# Patient Record
Sex: Female | Born: 1935 | Race: White | Marital: Married | Smoking: Never smoker
Health system: Southern US, Community
[De-identification: ages and names within clinical notes are randomized; demographics above are authoritative.]

## PROBLEM LIST (undated history)

## (undated) DIAGNOSIS — I350 Nonrheumatic aortic (valve) stenosis: Secondary | ICD-10-CM

## (undated) DIAGNOSIS — F0390 Unspecified dementia without behavioral disturbance: Secondary | ICD-10-CM

## (undated) DIAGNOSIS — I4891 Unspecified atrial fibrillation: Secondary | ICD-10-CM

## (undated) DIAGNOSIS — I639 Cerebral infarction, unspecified: Secondary | ICD-10-CM

## (undated) HISTORY — DX: Nonrheumatic aortic (valve) stenosis: I35.0

## (undated) HISTORY — PX: CHOLECYSTECTOMY: SHX55

## (undated) HISTORY — DX: Unspecified atrial fibrillation: I48.91

## (undated) HISTORY — PX: ABDOMINAL HYSTERECTOMY: SHX81

---

## 2011-10-28 ENCOUNTER — Ambulatory Visit
Admission: RE | Admit: 2011-10-28 | Discharge: 2011-10-28 | Disposition: A | Discharge status: Home / Self Care | Payer: Medicare Other | Source: Ambulatory Visit | Attending: Family Medicine | Admitting: Family Medicine

## 2011-10-28 ENCOUNTER — Other Ambulatory Visit: Payer: Self-pay | Admitting: Family Medicine

## 2011-10-28 DIAGNOSIS — M549 Dorsalgia, unspecified: Secondary | ICD-10-CM

## 2012-04-22 ENCOUNTER — Encounter (HOSPITAL_COMMUNITY): Payer: Self-pay | Admitting: *Deleted

## 2012-04-22 ENCOUNTER — Emergency Department (HOSPITAL_COMMUNITY): Payer: Medicare Other

## 2012-04-22 ENCOUNTER — Inpatient Hospital Stay (HOSPITAL_COMMUNITY)
Admission: EM | Admit: 2012-04-22 | Discharge: 2012-04-27 | DRG: 312 | Disposition: A | Discharge status: Skilled Nursing Facility | Payer: Medicare Other | Source: Ambulatory Visit | Attending: Internal Medicine | Admitting: Internal Medicine

## 2012-04-22 DIAGNOSIS — I080 Rheumatic disorders of both mitral and aortic valves: Secondary | ICD-10-CM | POA: Diagnosis present

## 2012-04-22 DIAGNOSIS — I10 Essential (primary) hypertension: Secondary | ICD-10-CM

## 2012-04-22 DIAGNOSIS — E7800 Pure hypercholesterolemia, unspecified: Secondary | ICD-10-CM

## 2012-04-22 DIAGNOSIS — R55 Syncope and collapse: Secondary | ICD-10-CM

## 2012-04-22 DIAGNOSIS — R001 Bradycardia, unspecified: Secondary | ICD-10-CM

## 2012-04-22 DIAGNOSIS — Z7902 Long term (current) use of antithrombotics/antiplatelets: Secondary | ICD-10-CM

## 2012-04-22 DIAGNOSIS — Z66 Do not resuscitate: Secondary | ICD-10-CM | POA: Diagnosis present

## 2012-04-22 DIAGNOSIS — R011 Cardiac murmur, unspecified: Secondary | ICD-10-CM

## 2012-04-22 DIAGNOSIS — M545 Low back pain, unspecified: Secondary | ICD-10-CM | POA: Diagnosis present

## 2012-04-22 DIAGNOSIS — E1165 Type 2 diabetes mellitus with hyperglycemia: Secondary | ICD-10-CM

## 2012-04-22 DIAGNOSIS — Z8679 Personal history of other diseases of the circulatory system: Secondary | ICD-10-CM

## 2012-04-22 DIAGNOSIS — E860 Dehydration: Secondary | ICD-10-CM

## 2012-04-22 DIAGNOSIS — IMO0002 Reserved for concepts with insufficient information to code with codable children: Secondary | ICD-10-CM

## 2012-04-22 DIAGNOSIS — IMO0001 Reserved for inherently not codable concepts without codable children: Secondary | ICD-10-CM | POA: Diagnosis present

## 2012-04-22 DIAGNOSIS — E785 Hyperlipidemia, unspecified: Secondary | ICD-10-CM | POA: Diagnosis present

## 2012-04-22 DIAGNOSIS — D72829 Elevated white blood cell count, unspecified: Secondary | ICD-10-CM

## 2012-04-22 DIAGNOSIS — Z9861 Coronary angioplasty status: Secondary | ICD-10-CM

## 2012-04-22 DIAGNOSIS — S32000A Wedge compression fracture of unspecified lumbar vertebra, initial encounter for closed fracture: Secondary | ICD-10-CM

## 2012-04-22 DIAGNOSIS — I350 Nonrheumatic aortic (valve) stenosis: Secondary | ICD-10-CM | POA: Diagnosis present

## 2012-04-22 DIAGNOSIS — I2510 Atherosclerotic heart disease of native coronary artery without angina pectoris: Secondary | ICD-10-CM | POA: Diagnosis present

## 2012-04-22 DIAGNOSIS — I498 Other specified cardiac arrhythmias: Secondary | ICD-10-CM | POA: Diagnosis present

## 2012-04-22 DIAGNOSIS — F0390 Unspecified dementia without behavioral disturbance: Secondary | ICD-10-CM

## 2012-04-22 DIAGNOSIS — N390 Urinary tract infection, site not specified: Secondary | ICD-10-CM | POA: Diagnosis present

## 2012-04-22 DIAGNOSIS — Z8673 Personal history of transient ischemic attack (TIA), and cerebral infarction without residual deficits: Secondary | ICD-10-CM

## 2012-04-22 HISTORY — DX: Cerebral infarction, unspecified: I63.9

## 2012-04-22 HISTORY — DX: Unspecified dementia, unspecified severity, without behavioral disturbance, psychotic disturbance, mood disturbance, and anxiety: F03.90

## 2012-04-22 LAB — CBC WITH DIFFERENTIAL/PLATELET
Basophils Relative: 0 % (ref 0–1)
Eosinophils Absolute: 0 10*3/uL (ref 0.0–0.7)
Hemoglobin: 13.1 g/dL (ref 12.0–15.0)
MCH: 28.6 pg (ref 26.0–34.0)
MCHC: 33.9 g/dL (ref 30.0–36.0)
Monocytes Absolute: 0.5 10*3/uL (ref 0.1–1.0)
Monocytes Relative: 4 % (ref 3–12)
Neutrophils Relative %: 89 % — ABNORMAL HIGH (ref 43–77)

## 2012-04-22 LAB — MRSA PCR SCREENING: MRSA by PCR: NEGATIVE

## 2012-04-22 LAB — CARDIAC PANEL(CRET KIN+CKTOT+MB+TROPI)
CK, MB: 2.9 ng/mL (ref 0.3–4.0)
Troponin I: <0.3 ng/mL (ref <0.30)

## 2012-04-22 LAB — COMPREHENSIVE METABOLIC PANEL
ALT: 9 U/L (ref 0–35)
Albumin: 3.4 g/dL — ABNORMAL LOW (ref 3.5–5.2)
Alkaline Phosphatase: 64 U/L (ref 39–117)
BUN: 14 mg/dL (ref 6–23)
Potassium: 3.9 mEq/L (ref 3.5–5.1)
Sodium: 136 mEq/L (ref 135–145)
Total Protein: 6.2 g/dL (ref 6.0–8.3)

## 2012-04-22 LAB — GLUCOSE, CAPILLARY: Glucose-Capillary: 285 mg/dL — ABNORMAL HIGH (ref 70–99)

## 2012-04-22 LAB — URINALYSIS, ROUTINE W REFLEX MICROSCOPIC
Bilirubin Urine: NEGATIVE
Glucose, UA: >1000 mg/dL — AB
Hgb urine dipstick: NEGATIVE
Ketones, ur: 40 mg/dL — AB
Protein, ur: NEGATIVE mg/dL

## 2012-04-22 LAB — BASIC METABOLIC PANEL
BUN: 14 mg/dL (ref 6–23)
Creatinine, Ser: 0.52 mg/dL (ref 0.50–1.10)
GFR calc Af Amer: >90 mL/min (ref >90)
GFR calc non Af Amer: 83 mL/min — ABNORMAL LOW (ref >90)
Potassium: 3.9 mEq/L (ref 3.5–5.1)

## 2012-04-22 MED ORDER — AMLODIPINE BESYLATE 5 MG PO TABS
5.0000 mg | ORAL_TABLET | Freq: Every day | ORAL | Status: DC
  Administered 2012-04-23 – 2012-04-27 (×5): 5 mg via ORAL
  Filled 2012-04-22 (×5): qty 1

## 2012-04-22 MED ORDER — SODIUM CHLORIDE 0.9 % IV SOLN
INTRAVENOUS | Status: AC
  Administered 2012-04-22 – 2012-04-23 (×2): via INTRAVENOUS

## 2012-04-22 MED ORDER — LINAGLIPTIN 5 MG PO TABS
5.0000 mg | ORAL_TABLET | Freq: Every day | ORAL | Status: DC
  Administered 2012-04-23 – 2012-04-27 (×5): 5 mg via ORAL
  Filled 2012-04-22 (×5): qty 1

## 2012-04-22 MED ORDER — INSULIN ASPART 100 UNIT/ML ~~LOC~~ SOLN
0.0000 [IU] | Freq: Three times a day (TID) | SUBCUTANEOUS | Status: DC
  Administered 2012-04-23 (×2): 2 [IU] via SUBCUTANEOUS
  Administered 2012-04-23: 3 [IU] via SUBCUTANEOUS
  Administered 2012-04-24 (×2): 5 [IU] via SUBCUTANEOUS
  Administered 2012-04-24: 2 [IU] via SUBCUTANEOUS
  Administered 2012-04-25: 3 [IU] via SUBCUTANEOUS
  Administered 2012-04-25: 5 [IU] via SUBCUTANEOUS
  Administered 2012-04-25: 3 [IU] via SUBCUTANEOUS
  Administered 2012-04-26: 2 [IU] via SUBCUTANEOUS
  Administered 2012-04-26: 5 [IU] via SUBCUTANEOUS
  Administered 2012-04-26: 2 [IU] via SUBCUTANEOUS
  Administered 2012-04-27 (×2): 3 [IU] via SUBCUTANEOUS

## 2012-04-22 MED ORDER — ONDANSETRON HCL 4 MG/2ML IJ SOLN
4.0000 mg | Freq: Once | INTRAMUSCULAR | Status: AC
  Administered 2012-04-22: 4 mg via INTRAVENOUS

## 2012-04-22 MED ORDER — CLOPIDOGREL BISULFATE 75 MG PO TABS
75.0000 mg | ORAL_TABLET | Freq: Every day | ORAL | Status: DC
  Administered 2012-04-23 – 2012-04-27 (×5): 75 mg via ORAL
  Filled 2012-04-22 (×6): qty 1

## 2012-04-22 MED ORDER — SENNOSIDES-DOCUSATE SODIUM 8.6-50 MG PO TABS
1.0000 | ORAL_TABLET | Freq: Every evening | ORAL | Status: DC | PRN
  Filled 2012-04-22: qty 1

## 2012-04-22 MED ORDER — GLIPIZIDE 10 MG PO TABS
10.0000 mg | ORAL_TABLET | Freq: Two times a day (BID) | ORAL | Status: DC
  Administered 2012-04-23 – 2012-04-27 (×9): 10 mg via ORAL
  Filled 2012-04-22 (×11): qty 1

## 2012-04-22 MED ORDER — ONDANSETRON HCL 4 MG/2ML IJ SOLN
INTRAMUSCULAR | Status: AC
  Filled 2012-04-22: qty 2

## 2012-04-22 MED ORDER — ONDANSETRON HCL 4 MG/2ML IJ SOLN
INTRAMUSCULAR | Status: AC
  Administered 2012-04-22: 4 mg via INTRAVENOUS
  Filled 2012-04-22: qty 2

## 2012-04-22 MED ORDER — MORPHINE SULFATE 2 MG/ML IJ SOLN
2.0000 mg | Freq: Once | INTRAMUSCULAR | Status: AC
  Administered 2012-04-22: 2 mg via INTRAVENOUS
  Filled 2012-04-22: qty 1

## 2012-04-22 NOTE — ED Notes (Signed)
While getting pt back in the bed after orthostatic vitals she became sick. Heart rate dropped down to 25. Nurse notified. Pt now laying comfortably in bed.

## 2012-04-22 NOTE — ED Notes (Signed)
C-collar removed per MD Hyman Hopes.

## 2012-04-22 NOTE — ED Notes (Signed)
After standing patient up to obtain orthostatic BP, pt became nauseous, HR noted to drop to 25, pt assisted back into bed, started vomiting, HR returned to 80, MD Hyman Hopes notified and to bedside to assess patient.

## 2012-04-22 NOTE — Progress Notes (Signed)
ANTIBIOTIC CONSULT NOTE - INITIAL  Pharmacy Consult for Ciprofloxacin Indication: Possible UTI  Allergies  Allergen Reactions  . Penicillins     Patient Measurements: Height: 5\' 5"  (165.1 cm) Weight: 116 lb 2.9 oz (52.7 kg) IBW/kg (Calculated) : 57   Vital Signs: Temp: 98 F (36.7 C) (06/30 2200) Temp src: Oral (06/30 2200) BP: 141/55 mmHg (06/30 2200) Pulse Rate: 65  (06/30 2200) Intake/Output from previous day:   Intake/Output from this shift:    Labs:  [facility] 04/22/12 2100 04/22/12 1825  WBC -- 12.5*  HGB -- 13.1  PLT -- 284  LABCREA -- --  CREATININE 0.56 0.52   Estimated Creatinine Clearance: 41.2 ml/min (by C-G formula based on Cr of 0.56). No results found for this basename: VANCOTROUGH:2,VANCOPEAK:2,VANCORANDOM:2,GENTTROUGH:2,GENTPEAK:2,GENTRANDOM:2,TOBRATROUGH:2,TOBRAPEAK:2,TOBRARND:2,AMIKACINPEAK:2,AMIKACINTROU:2,AMIKACIN:2, in the last 72 hours   Microbiology: No results found for this or any previous visit (from the past 720 hour(s)).  Medical History: Past Medical History  Diagnosis Date  . Diabetes mellitus   . Dementia   . Stroke     Medications:  Prescriptions prior to admission  Medication Sig Dispense Refill  . amLODipine (NORVASC) 5 MG tablet Take 5 mg by mouth daily.      . clopidogrel (PLAVIX) 75 MG tablet Take 75 mg by mouth daily.      Marland Kitchen glipiZIDE (GLUCOTROL) 10 MG tablet Take 10 mg by mouth 2 (two) times daily before a meal.      . sitaGLIPtin (JANUVIA) 100 MG tablet Take 100 mg by mouth daily.       Assessment: 78 y.o. female presents with LOC. To begin Cipro for r/o UTI. Noted urine cx pending.  Goal of Therapy:  Eradication of infection  Plan:  1. Ciprofloxacin 400mg  IV q12h. 2. Will f/u renal function and microbiological data  Christoper Fabian, PharmD, BCPS Clinical pharmacist, pager [phone] 04/22/2012,10:54 PM

## 2012-04-22 NOTE — ED Notes (Signed)
Pt to CT scan.

## 2012-04-22 NOTE — ED Notes (Signed)
MD Toniann Fail notified of patient bradycardia episode, pt admission status changed to step down, MD at bedside to assess patient.

## 2012-04-22 NOTE — ED Provider Notes (Signed)
History     CSN: [id number]  Arrival date & time 04/22/12  1728   First MD Initiated Contact with Patient 04/22/12 1813      Chief Complaint  Patient presents with  . Fall    Patient is a 78 y.o. female presenting with syncope. The history is provided by the patient and the EMS personnel.  Loss of Consciousness This is a new problem. The current episode started today. Episode frequency: Patient fell once onto the left side of her head; unclear events surrounding as patient does not remember what happened, but she does remember being diaphoretic prior to the fall. The problem has been resolved. Associated symptoms include diaphoresis and weakness (at baseline). Pertinent negatives include no abdominal pain, chest pain, chills, coughing, fever, neck pain, numbness, rash or vomiting. The symptoms are aggravated by standing. She has tried nothing for the symptoms.    Past Medical History  Diagnosis Date  . Diabetes mellitus   . Dementia   . Stroke     History reviewed. No pertinent past surgical history.  History reviewed. No pertinent family history.  History  Substance Use Topics  . Smoking status: Not on file  . Smokeless tobacco: Not on file  . Alcohol Use:     OB History    Grav Para Term Preterm Abortions TAB SAB Ect Mult Living                  Review of Systems  Constitutional: Positive for diaphoresis. Negative for fever, chills, activity change and appetite change.  HENT: Negative for neck pain and neck stiffness.   Respiratory: Negative for cough, chest tightness, shortness of breath and wheezing.   Cardiovascular: Positive for syncope. Negative for chest pain and palpitations.  Gastrointestinal: Negative for vomiting, abdominal pain, diarrhea and constipation.  Genitourinary: Negative for dysuria, decreased urine volume and difficulty urinating.  Skin: Negative for rash and wound.  Neurological: Positive for syncope and weakness (at baseline). Negative for  seizures, facial asymmetry, light-headedness and numbness.  Psychiatric/Behavioral: Negative for confusion, decreased concentration and agitation.  All other systems reviewed and are negative.    Allergies  Review of patient's allergies indicates not on file.  Home Medications   Current Outpatient Rx  Name Route Sig Dispense Refill  . AMLODIPINE BESYLATE 5 MG PO TABS Oral Take 5 mg by mouth daily.    Marland Kitchen CLOPIDOGREL BISULFATE 75 MG PO TABS Oral Take 75 mg by mouth daily.    Marland Kitchen GLIPIZIDE 10 MG PO TABS Oral Take 10 mg by mouth 2 (two) times daily before a meal.    . SITAGLIPTIN PHOSPHATE 100 MG PO TABS Oral Take 100 mg by mouth daily.      BP 170/58  Pulse 68  Temp 97.6 F (36.4 C) (Oral)  Resp 18  SpO2 100%  Physical Exam  Nursing note and vitals reviewed. Constitutional: She appears well-developed and well-nourished.  HENT:  Head: Normocephalic and atraumatic.  Right Ear: External ear normal.  Left Ear: External ear normal.  Nose: Nose normal.  Mouth/Throat: Oropharynx is clear and moist. No oropharyngeal exudate.  Eyes: Conjunctivae are normal. Pupils are equal, round, and reactive to light.  Neck: Normal range of motion. Neck supple.  Cardiovascular: Normal rate, regular rhythm, normal heart sounds and intact distal pulses.  Exam reveals no gallop and no friction rub.   No murmur heard. Pulmonary/Chest: Effort normal and breath sounds normal. No respiratory distress. She has no wheezes. She has no rales. She  exhibits no tenderness.  Abdominal: Soft. Bowel sounds are normal. She exhibits distension (mildly). She exhibits no mass. There is no tenderness. There is no rebound and no guarding.  Musculoskeletal: Normal range of motion. She exhibits no edema and no tenderness.  Neurological: She is alert. She displays normal reflexes. No cranial nerve deficit. She exhibits abnormal muscle tone (decreased strength on hip flexion and shoulder extension bilaterally and equal).  Coordination normal.       Oriented x 2  Skin: Skin is warm and dry.  Psychiatric: She has a normal mood and affect. Her behavior is normal. Judgment and thought content normal.    ED Course  Procedures (including critical care time)  Labs Reviewed  GLUCOSE, CAPILLARY - Abnormal; Notable for the following:    Glucose-Capillary 285 (*)     All other components within normal limits  TROPONIN I  CBC WITH DIFFERENTIAL  BASIC METABOLIC PANEL  PROTIME-INR   Dg Lumbar Spine Complete  04/22/2012  *RADIOLOGY REPORT*  Clinical Data: Fall, low back pain.  LUMBAR SPINE - COMPLETE 4+ VIEW  Comparison: 10/28/2011  Findings: Moderate leftward scoliosis of the lumbar spine. Associated degenerative disc disease.  Diffuse degenerative facet disease.  Transitional anatomy at the lumbar sacral junction. Moderate compression fracture through L2, slightly progressed since prior study.  No new fracture.  Slight anterolisthesis of L4 on L5, related to facet disease.  SI joints are symmetric and unremarkable.  IMPRESSION: Levoscoliosis.  Advanced degenerative disc and facet disease.  Progressive L2 compression fracture.  No acute fracture.  Original Report Authenticated By: Cyndie Chime, M.D.   Ct Head Wo Contrast  04/22/2012  *RADIOLOGY REPORT*  Clinical Data:  Fall.  Injury.  CT HEAD WITHOUT CONTRAST CT CERVICAL SPINE WITHOUT CONTRAST  Technique:  Multidetector CT imaging of the head and cervical spine was performed following the standard protocol without intravenous contrast.  Multiplanar CT image reconstructions of the cervical spine were also generated.  Comparison:   None  CT HEAD  Findings: Remote left occipital and bilateral remote parietal infarcts noted.  There is a suggestion of small remote frontal vertex infarct and underlying chronic ischemic microvascular white matter disease.  Old infarcts involving the left basal ganglia noted.  The brainstem and cerebellum appear unremarkable.  Confluence of the  white matter hypodensity in the occipital parietal region noted.  No hydrocephalus, intracranial hemorrhage, or discrete mass lesion is observed.  Atherosclerotic calcification of the carotid siphons noted.  IMPRESSION:  1.  Multiple remote scattered infarcts, without acute intracranial findings. 2.  White matter confluence of hypodensity in the parietal lobes is likely due to white matter infarcts, and less likely to be due to other entities such as posterior reversible encephalopathy or other underlying lesion.  CT CERVICAL SPINE  Findings: There is chronic failure fusion of the posterior arch of C1.  Facet arthropathy is particularly bilaterally at C2-3, C3-4, and C4-5.  Facet arthropathy is also somewhat prominent on the right at T1-2, T2-3, and T3-4.  No cervical spine fracture or acute subluxation is observed.  Loss of intervertebral disc height and posterior osseous ridging noted at C4-5, C5-6, C6-7, and T1-2.  Uncinate and facet spurring cause osseous foraminal stenosis on the left at C4-5 and C5-6, and to a lesser extent at T1-2.  Osseous foraminal narrowing on the right side is present at C3-4, C4-5, C5- 6, C6-7, and T1-2.  There is a small amount of pannus posterior to the odontoid.  IMPRESSION:  1.  Cervical  spondylosis with multilevel osseous foraminal stenosis, but without fracture or acute subluxation observed. 2.  Failure fusion of the posterior arch of C1.  Original Report Authenticated By: Dellia Cloud, M.D.   Ct Cervical Spine Wo Contrast  04/22/2012  *RADIOLOGY REPORT*  Clinical Data:  Fall.  Injury.  CT HEAD WITHOUT CONTRAST CT CERVICAL SPINE WITHOUT CONTRAST  Technique:  Multidetector CT imaging of the head and cervical spine was performed following the standard protocol without intravenous contrast.  Multiplanar CT image reconstructions of the cervical spine were also generated.  Comparison:   None  CT HEAD  Findings: Remote left occipital and bilateral remote parietal infarcts  noted.  There is a suggestion of small remote frontal vertex infarct and underlying chronic ischemic microvascular white matter disease.  Old infarcts involving the left basal ganglia noted.  The brainstem and cerebellum appear unremarkable.  Confluence of the white matter hypodensity in the occipital parietal region noted.  No hydrocephalus, intracranial hemorrhage, or discrete mass lesion is observed.  Atherosclerotic calcification of the carotid siphons noted.  IMPRESSION:  1.  Multiple remote scattered infarcts, without acute intracranial findings. 2.  White matter confluence of hypodensity in the parietal lobes is likely due to white matter infarcts, and less likely to be due to other entities such as posterior reversible encephalopathy or other underlying lesion.  CT CERVICAL SPINE  Findings: There is chronic failure fusion of the posterior arch of C1.  Facet arthropathy is particularly bilaterally at C2-3, C3-4, and C4-5.  Facet arthropathy is also somewhat prominent on the right at T1-2, T2-3, and T3-4.  No cervical spine fracture or acute subluxation is observed.  Loss of intervertebral disc height and posterior osseous ridging noted at C4-5, C5-6, C6-7, and T1-2.  Uncinate and facet spurring cause osseous foraminal stenosis on the left at C4-5 and C5-6, and to a lesser extent at T1-2.  Osseous foraminal narrowing on the right side is present at C3-4, C4-5, C5- 6, C6-7, and T1-2.  There is a small amount of pannus posterior to the odontoid.  IMPRESSION:  1.  Cervical spondylosis with multilevel osseous foraminal stenosis, but without fracture or acute subluxation observed. 2.  Failure fusion of the posterior arch of C1.  Original Report Authenticated By: Dellia Cloud, M.D.   Dg Chest Portable 1 View  04/22/2012  *RADIOLOGY REPORT*  Clinical Data: Fall, back pain.  PORTABLE CHEST - 1 VIEW  Comparison: None  Findings: Mild biapical scarring.  Lungs otherwise clear.  Heart is normal size.  No  effusions.  No acute bony abnormality.  IMPRESSION: No active cardiopulmonary disease.  Original Report Authenticated By: Cyndie Chime, M.D.     1. Syncope      MDM  78 yo F presents after un-witnessed fall concerning for syncope. She states she felt diaphoretic prior to fall and does not believe she tripped over anything. After syncopal event patient became nauseated and vomited. No focal neuro deficits on exam. Patient does take Plavix for a history of a cardiac stent and has a history of diabetes mellitus, but patient not hypoglycemic. Imaging of head negative for evidence of skull fracture or intracranial bleed. Cervical spine imaging negative for evidence of cervical spine injury. Patient did have several more episodes of emesis in the Emergency Department after standing upright for orthostatics and became bradycardic to the 20's briefly during this episode. Before treatment could be administered, her rate returned to its baseline. No evidence of heart block or ischemia on EKG.  Hospitalist service consulted and will admit.         Clemetine Marker, MD 04/22/12 [phone]

## 2012-04-22 NOTE — ED Notes (Signed)
Pt returned to room from CT. MD Hyman Hopes at bedside, will obtain orthostatics and EKG once assessment is completed.

## 2012-04-22 NOTE — H&P (Signed)
Lorraine Tran is an 78 y.o. female.   PCP - Dr.Koirala of [facility]. Chief Complaint: Loss of consciousness. HPI: 78 year-old female with history of diabetes mellitus type 2, previous history of stroke on Plavix, hypertension had some loss of consciousness while she was at home at her yard. Her neighbors called the EMS and patient was brought to the ER. Does not know how long she lost consciousness for. She had incontinence of urine. In the ER she was complaining of low back pain. CT head and C-spine x-ray of LS-spine and chest x-ray does not show anything acute. While in the ER patient did receive morphine for back pain. We'll try to get orthostatics patient threw up once and during that episode patient had bradycardia and heart rate went down to 28 per minute with several episodes of 3 second pauses. Patient is only oriented to her name denies any chest pain palpitations shortness of breath or abdominal pain.  Past Medical History  Diagnosis Date  . Diabetes mellitus   . Dementia   . Stroke     Past Surgical History  Procedure Date  . Abdominal hysterectomy   . Cholecystectomy     History reviewed. No pertinent family history. Social History:  reports that she has never smoked. She does not have any smokeless tobacco history on file. She reports that she does not drink alcohol or use illicit drugs.  Allergies:  Allergies  Allergen Reactions  . Penicillins     Medications Prior to Admission  Medication Sig Dispense Refill  . amLODipine (NORVASC) 5 MG tablet Take 5 mg by mouth daily.      . clopidogrel (PLAVIX) 75 MG tablet Take 75 mg by mouth daily.      Marland Kitchen glipiZIDE (GLUCOTROL) 10 MG tablet Take 10 mg by mouth 2 (two) times daily before a meal.      . sitaGLIPtin (JANUVIA) 100 MG tablet Take 100 mg by mouth daily.        Results for orders placed during the hospital encounter of 04/22/12 (from the past 48 hour(s))  GLUCOSE, CAPILLARY     Status: Abnormal    Collection Time   04/22/12  5:50 PM      Component Value Range Comment   Glucose-Capillary 285 (*) 70 - 99 mg/dL   TROPONIN I     Status: Normal   Collection Time   04/22/12  6:24 PM      Component Value Range Comment   Troponin I <0.30  <0.30 ng/mL   CBC WITH DIFFERENTIAL     Status: Abnormal   Collection Time   04/22/12  6:25 PM      Component Value Range Comment   WBC 12.5 (*) 4.0 - 10.5 K/uL    RBC 4.58  3.87 - 5.11 MIL/uL    Hemoglobin 13.1  12.0 - 15.0 g/dL    HCT 16.1  09.6 - 04.5 %    MCV 84.3  78.0 - 100.0 fL    MCH 28.6  26.0 - 34.0 pg    MCHC 33.9  30.0 - 36.0 g/dL    RDW 40.9  81.1 - 91.4 %    Platelets 284  150 - 400 K/uL    Neutrophils Relative 89 (*) 43 - 77 %    Neutro Abs 11.1 (*) 1.7 - 7.7 K/uL    Lymphocytes Relative 7 (*) 12 - 46 %    Lymphs Abs 0.9  0.7 - 4.0 K/uL    Monocytes  Relative 4  3 - 12 %    Monocytes Absolute 0.5  0.1 - 1.0 K/uL    Eosinophils Relative 0  0 - 5 %    Eosinophils Absolute 0.0  0.0 - 0.7 K/uL    Basophils Relative 0  0 - 1 %    Basophils Absolute 0.0  0.0 - 0.1 K/uL   BASIC METABOLIC PANEL     Status: Abnormal   Collection Time   04/22/12  6:25 PM      Component Value Range Comment   Sodium 134 (*) 135 - 145 mEq/L    Potassium 3.9  3.5 - 5.1 mEq/L    Chloride 93 (*) 96 - 112 mEq/L    CO2 26  19 - 32 mEq/L    Glucose, Bld 297 (*) 70 - 99 mg/dL    BUN 14  6 - 23 mg/dL    Creatinine, Ser 4.09  0.50 - 1.10 mg/dL    Calcium 9.5  8.4 - 81.1 mg/dL    GFR calc non Af Amer 83 (*) >90 mL/min    GFR calc Af Amer >90  >90 mL/min   PROTIME-INR     Status: Normal   Collection Time   04/22/12  6:25 PM      Component Value Range Comment   Prothrombin Time 13.2  11.6 - 15.2 seconds    INR 0.98  0.00 - 1.49   URINALYSIS, ROUTINE W REFLEX MICROSCOPIC     Status: Abnormal   Collection Time   04/22/12  7:40 PM      Component Value Range Comment   Color, Urine YELLOW  YELLOW    APPearance CLOUDY (*) CLEAR    Specific Gravity, Urine 1.022   1.005 - 1.030    pH 5.5  5.0 - 8.0    Glucose, UA >1000 (*) NEGATIVE mg/dL    Hgb urine dipstick NEGATIVE  NEGATIVE    Bilirubin Urine NEGATIVE  NEGATIVE    Ketones, ur 40 (*) NEGATIVE mg/dL    Protein, ur NEGATIVE  NEGATIVE mg/dL    Urobilinogen, UA 0.2  0.0 - 1.0 mg/dL    Nitrite NEGATIVE  NEGATIVE    Leukocytes, UA TRACE (*) NEGATIVE   URINE MICROSCOPIC-ADD ON     Status: Normal   Collection Time   04/22/12  7:40 PM      Component Value Range Comment   Squamous Epithelial / LPF RARE  RARE    WBC, UA 0-2  <3 WBC/hpf    Dg Lumbar Spine Complete  04/22/2012  *RADIOLOGY REPORT*  Clinical Data: Fall, low back pain.  LUMBAR SPINE - COMPLETE 4+ VIEW  Comparison: 10/28/2011  Findings: Moderate leftward scoliosis of the lumbar spine. Associated degenerative disc disease.  Diffuse degenerative facet disease.  Transitional anatomy at the lumbar sacral junction. Moderate compression fracture through L2, slightly progressed since prior study.  No new fracture.  Slight anterolisthesis of L4 on L5, related to facet disease.  SI joints are symmetric and unremarkable.  IMPRESSION: Levoscoliosis.  Advanced degenerative disc and facet disease.  Progressive L2 compression fracture.  No acute fracture.  Original Report Authenticated By: Cyndie Chime, M.D.   Ct Head Wo Contrast  04/22/2012  *RADIOLOGY REPORT*  Clinical Data:  Fall.  Injury.  CT HEAD WITHOUT CONTRAST CT CERVICAL SPINE WITHOUT CONTRAST  Technique:  Multidetector CT imaging of the head and cervical spine was performed following the standard protocol without intravenous contrast.  Multiplanar CT image reconstructions of the cervical  spine were also generated.  Comparison:   None  CT HEAD  Findings: Remote left occipital and bilateral remote parietal infarcts noted.  There is a suggestion of small remote frontal vertex infarct and underlying chronic ischemic microvascular white matter disease.  Old infarcts involving the left basal ganglia noted.  The  brainstem and cerebellum appear unremarkable.  Confluence of the white matter hypodensity in the occipital parietal region noted.  No hydrocephalus, intracranial hemorrhage, or discrete mass lesion is observed.  Atherosclerotic calcification of the carotid siphons noted.  IMPRESSION:  1.  Multiple remote scattered infarcts, without acute intracranial findings. 2.  White matter confluence of hypodensity in the parietal lobes is likely due to white matter infarcts, and less likely to be due to other entities such as posterior reversible encephalopathy or other underlying lesion.  CT CERVICAL SPINE  Findings: There is chronic failure fusion of the posterior arch of C1.  Facet arthropathy is particularly bilaterally at C2-3, C3-4, and C4-5.  Facet arthropathy is also somewhat prominent on the right at T1-2, T2-3, and T3-4.  No cervical spine fracture or acute subluxation is observed.  Loss of intervertebral disc height and posterior osseous ridging noted at C4-5, C5-6, C6-7, and T1-2.  Uncinate and facet spurring cause osseous foraminal stenosis on the left at C4-5 and C5-6, and to a lesser extent at T1-2.  Osseous foraminal narrowing on the right side is present at C3-4, C4-5, C5- 6, C6-7, and T1-2.  There is a small amount of pannus posterior to the odontoid.  IMPRESSION:  1.  Cervical spondylosis with multilevel osseous foraminal stenosis, but without fracture or acute subluxation observed. 2.  Failure fusion of the posterior arch of C1.  Original Report Authenticated By: Dellia Cloud, M.D.   Ct Cervical Spine Wo Contrast  04/22/2012  *RADIOLOGY REPORT*  Clinical Data:  Fall.  Injury.  CT HEAD WITHOUT CONTRAST CT CERVICAL SPINE WITHOUT CONTRAST  Technique:  Multidetector CT imaging of the head and cervical spine was performed following the standard protocol without intravenous contrast.  Multiplanar CT image reconstructions of the cervical spine were also generated.  Comparison:   None  CT HEAD  Findings:  Remote left occipital and bilateral remote parietal infarcts noted.  There is a suggestion of small remote frontal vertex infarct and underlying chronic ischemic microvascular white matter disease.  Old infarcts involving the left basal ganglia noted.  The brainstem and cerebellum appear unremarkable.  Confluence of the white matter hypodensity in the occipital parietal region noted.  No hydrocephalus, intracranial hemorrhage, or discrete mass lesion is observed.  Atherosclerotic calcification of the carotid siphons noted.  IMPRESSION:  1.  Multiple remote scattered infarcts, without acute intracranial findings. 2.  White matter confluence of hypodensity in the parietal lobes is likely due to white matter infarcts, and less likely to be due to other entities such as posterior reversible encephalopathy or other underlying lesion.  CT CERVICAL SPINE  Findings: There is chronic failure fusion of the posterior arch of C1.  Facet arthropathy is particularly bilaterally at C2-3, C3-4, and C4-5.  Facet arthropathy is also somewhat prominent on the right at T1-2, T2-3, and T3-4.  No cervical spine fracture or acute subluxation is observed.  Loss of intervertebral disc height and posterior osseous ridging noted at C4-5, C5-6, C6-7, and T1-2.  Uncinate and facet spurring cause osseous foraminal stenosis on the left at C4-5 and C5-6, and to a lesser extent at T1-2.  Osseous foraminal narrowing on the right side  is present at C3-4, C4-5, C5- 6, C6-7, and T1-2.  There is a small amount of pannus posterior to the odontoid.  IMPRESSION:  1.  Cervical spondylosis with multilevel osseous foraminal stenosis, but without fracture or acute subluxation observed. 2.  Failure fusion of the posterior arch of C1.  Original Report Authenticated By: Dellia Cloud, M.D.   Dg Chest Portable 1 View  04/22/2012  *RADIOLOGY REPORT*  Clinical Data: Fall, back pain.  PORTABLE CHEST - 1 VIEW  Comparison: None  Findings: Mild biapical  scarring.  Lungs otherwise clear.  Heart is normal size.  No effusions.  No acute bony abnormality.  IMPRESSION: No active cardiopulmonary disease.  Original Report Authenticated By: Cyndie Chime, M.D.    Review of Systems  Constitutional: Negative.   HENT: Negative.   Eyes: Negative.   Cardiovascular: Negative.   Gastrointestinal: Positive for nausea and vomiting.  Genitourinary: Negative.   Musculoskeletal: Positive for back pain.  Skin: Negative.   Neurological: Positive for loss of consciousness.  Endo/Heme/Allergies: Negative.   Psychiatric/Behavioral: Negative.     Blood pressure 147/51, pulse 63, temperature 97.6 F (36.4 C), temperature source Oral, resp. rate 17, SpO2 100.00%. Physical Exam  Constitutional: She appears well-developed and well-nourished.  HENT:  Head: Normocephalic and atraumatic.  Right Ear: External ear normal.  Left Ear: External ear normal.  Nose: Nose normal.  Mouth/Throat: Oropharynx is clear and moist. No oropharyngeal exudate.  Eyes: Conjunctivae are normal. Pupils are equal, round, and reactive to light. Right eye exhibits no discharge. Left eye exhibits no discharge. No scleral icterus.  Neck: Normal range of motion. Neck supple.  Cardiovascular: Normal rate and regular rhythm.   Murmur heard. Respiratory: Effort normal and breath sounds normal. No respiratory distress. She has no wheezes. She has no rales.  GI: Soft. Bowel sounds are normal. She exhibits no distension. There is no tenderness. There is no rebound.  Neurological: She is alert.       Oriented to name only. Moves all extremities but moving lower extremities causes pain.  Skin: Skin is warm and dry.     Assessment/Plan #1. Syncope - given the bradycardic episodes and heart murmur a syncopal episode is most likely from cardiac cause. We'll observe in telemetry. I did discuss with on-call cardiologist Dr. Aldona Bar who will be seeing patient in consult. Given history of stroke  we'll get an MRI brain and until then patient will be on neurochecks. Gently hydrate. #2. Low back pain - x-ray L. spine shows progression of L2 spine fracture. I did discuss with radiologist who says there is no acute fracture is in the x-ray. Patient does have significant pain on moving her hips for which I ordered x-ray pelvis and also will get MRI of the LS-spine. #3. Uncontrolled diabetes - I don't think patient is in DKA. Follow metabolic panel closely. Gently hydrate and patient will be continued on her Glucotrol and Januvia sliding-scale coverage. #4. Leukocytosis - probably reactionary. Could also be from UTI. #5. Possible UTI - check urine cultures. I have placed patient on ciprofloxacin. #6. History of hypertension - continue present medication. #7. History of stroke on Plavix.  CODE STATUS - full code.  Eduard Clos 04/22/2012, 10:13 PM

## 2012-04-22 NOTE — ED Provider Notes (Addendum)
BP 111/72  Pulse 84  Temp 97.6 F (36.4 C) (Oral)  Resp 18  SpO2 94%  I saw and evaluated the patient, reviewed the resident's note and I agree with the findings and plan, ekg interpretation (reviewed by me)  LOC unk cause. Vomited after awakening. C/O headache, neck pain, lower back pain. Neuro unremarkable. EKG unremarkable. Plans for imaging and admission for syncope w/u. O2 sat 97-100% on arrival, 94% after morphine 2mg . Will reassess.   Forbes Cellar, MD 04/22/12 2020  Forbes Cellar, MD 04/22/12 2022  Pt did have a near syncopal episode in ED while tech staff performing orthostatics. HR 20s at that time, indeterminate rhythm. WNL after lying down. Vomiting. Zofran, hosp aware.  Forbes Cellar, MD 04/24/12 [phone]

## 2012-04-22 NOTE — ED Notes (Signed)
Pt in via EMS- per EMS, they were called by a person who drove by the patients house and noted patient to be laying on porch, pt was responsive upon arrival, unable to determine if patient fell or if she just sat down and laid down. Pt admits to intermittent vomiting over last two days. No trauma noted to patient head or face, dry dirt noted to patient face from laying down. Pt CBG was 200 per EMS. Vital signs within normal limits, EKG within normal limits. Pt c/o pain to mid-back pain to palpation, pt placed on LSB and c-collar by EMS. Pt alert and oriented per her normal, pt with history of dementia per patient daughter. Pt states she remember sitting outside and then she remembers the paramedics, pt unsure if she fell.

## 2012-04-23 ENCOUNTER — Inpatient Hospital Stay (HOSPITAL_COMMUNITY): Payer: Medicare Other

## 2012-04-23 DIAGNOSIS — R001 Bradycardia, unspecified: Secondary | ICD-10-CM | POA: Diagnosis present

## 2012-04-23 DIAGNOSIS — S32000A Wedge compression fracture of unspecified lumbar vertebra, initial encounter for closed fracture: Secondary | ICD-10-CM | POA: Diagnosis present

## 2012-04-23 DIAGNOSIS — I359 Nonrheumatic aortic valve disorder, unspecified: Secondary | ICD-10-CM

## 2012-04-23 DIAGNOSIS — E7800 Pure hypercholesterolemia, unspecified: Secondary | ICD-10-CM | POA: Diagnosis present

## 2012-04-23 DIAGNOSIS — R55 Syncope and collapse: Principal | ICD-10-CM

## 2012-04-23 DIAGNOSIS — I350 Nonrheumatic aortic (valve) stenosis: Secondary | ICD-10-CM | POA: Diagnosis present

## 2012-04-23 DIAGNOSIS — D72829 Elevated white blood cell count, unspecified: Secondary | ICD-10-CM | POA: Diagnosis present

## 2012-04-23 DIAGNOSIS — I498 Other specified cardiac arrhythmias: Secondary | ICD-10-CM

## 2012-04-23 DIAGNOSIS — F0390 Unspecified dementia without behavioral disturbance: Secondary | ICD-10-CM

## 2012-04-23 DIAGNOSIS — E860 Dehydration: Secondary | ICD-10-CM | POA: Diagnosis present

## 2012-04-23 LAB — LIPID PANEL
Cholesterol: 228 mg/dL — ABNORMAL HIGH (ref 0–200)
HDL: 51 mg/dL (ref >39)
Total CHOL/HDL Ratio: 4.5 RATIO
Triglycerides: 120 mg/dL (ref <150)

## 2012-04-23 LAB — CARDIAC PANEL(CRET KIN+CKTOT+MB+TROPI)
CK, MB: 3.4 ng/mL (ref 0.3–4.0)
Relative Index: INVALID (ref 0.0–2.5)
Relative Index: 3 — ABNORMAL HIGH (ref 0.0–2.5)
Total CK: 60 U/L (ref 7–177)
Troponin I: <0.3 ng/mL (ref <0.30)

## 2012-04-23 LAB — COMPREHENSIVE METABOLIC PANEL
ALT: 7 U/L (ref 0–35)
AST: 11 U/L (ref 0–37)
Alkaline Phosphatase: 59 U/L (ref 39–117)
CO2: 25 mEq/L (ref 19–32)
GFR calc Af Amer: >90 mL/min (ref >90)
GFR calc non Af Amer: 83 mL/min — ABNORMAL LOW (ref >90)
Glucose, Bld: 234 mg/dL — ABNORMAL HIGH (ref 70–99)
Potassium: 3.8 mEq/L (ref 3.5–5.1)
Sodium: 138 mEq/L (ref 135–145)

## 2012-04-23 LAB — CBC
HCT: 33.5 % — ABNORMAL LOW (ref 36.0–46.0)
Hemoglobin: 11.4 g/dL — ABNORMAL LOW (ref 12.0–15.0)
MCH: 28.6 pg (ref 26.0–34.0)
MCHC: 34 g/dL (ref 30.0–36.0)
Platelets: 270 10*3/uL (ref 150–400)

## 2012-04-23 LAB — GLUCOSE, CAPILLARY: Glucose-Capillary: 170 mg/dL — ABNORMAL HIGH (ref 70–99)

## 2012-04-23 LAB — HEMOGLOBIN A1C
Hgb A1c MFr Bld: 8.6 % — ABNORMAL HIGH (ref <5.7)
Mean Plasma Glucose: 200 mg/dL — ABNORMAL HIGH (ref <117)

## 2012-04-23 MED ORDER — ATORVASTATIN CALCIUM 10 MG PO TABS
10.0000 mg | ORAL_TABLET | Freq: Every day | ORAL | Status: DC
  Administered 2012-04-23 – 2012-04-26 (×4): 10 mg via ORAL
  Filled 2012-04-23 (×5): qty 1

## 2012-04-23 MED ORDER — WHITE PETROLATUM GEL
Status: AC
  Filled 2012-04-23: qty 5

## 2012-04-23 MED ORDER — PNEUMOCOCCAL VAC POLYVALENT 25 MCG/0.5ML IJ INJ
0.5000 mL | INJECTION | INTRAMUSCULAR | Status: AC
  Administered 2012-04-24: 0.5 mL via INTRAMUSCULAR
  Filled 2012-04-23: qty 0.5

## 2012-04-23 NOTE — Progress Notes (Signed)
CRITICAL VALUE ALERT  Critical value received:  CKMB-6.3  Date of notification:  04-23-12  Time of notification:  1639  Critical value read back:yes  Nurse who received alert:  Armida Sans RN  MD notified (1st page):  Sharon Seller, MD  Time of first page:  1640  MD notified (2nd page):  Time of second page:  Responding MD:  Sharon Seller  Time MD responded:  [id number]

## 2012-04-23 NOTE — Progress Notes (Signed)
Utilization Review Completed.  Lorraine Tran T  04/23/2012  

## 2012-04-23 NOTE — Progress Notes (Signed)
Subjective: A little woozy Objective: Filed Vitals:   04/23/12 1000 04/23/12 1100 04/23/12 1232 04/23/12 1417  BP: 124/54 144/46 125/70   Pulse: 74 75 87 81  Temp:  98 F (36.7 C)    TempSrc:  Oral    Resp: 20 22 17    Height:      Weight:      SpO2: 95% 91% 95%    Weight change:   Intake/Output Summary (Last 24 hours) at 04/23/12 1524 Last data filed at 04/23/12 1200  Gross per 24 hour  Intake   1320 ml  Output    675 ml  Net    645 ml    General: Alert, awake, oriented x3, in no acute distress Neck:  JVP is normal Heart: Regular rate and rhythm  Gr III/VI systolic Lungs: Clear to auscultation.  No rales or wheezes. Exemities:  No edema.   Neuro: Grossly intact, nonfocal.  TELE:  SR.  No signif pauses  Lab Results: Results for orders placed during the hospital encounter of 04/22/12 (from the past 24 hour(s))  GLUCOSE, CAPILLARY     Status: Abnormal   Collection Time   04/22/12  5:50 PM      Component Value Range   Glucose-Capillary 285 (*) 70 - 99 mg/dL  TROPONIN I     Status: Normal   Collection Time   04/22/12  6:24 PM      Component Value Range   Troponin I <0.30  <0.30 ng/mL  CBC WITH DIFFERENTIAL     Status: Abnormal   Collection Time   04/22/12  6:25 PM      Component Value Range   WBC 12.5 (*) 4.0 - 10.5 K/uL   RBC 4.58  3.87 - 5.11 MIL/uL   Hemoglobin 13.1  12.0 - 15.0 g/dL   HCT 16.1  09.6 - 04.5 %   MCV 84.3  78.0 - 100.0 fL   MCH 28.6  26.0 - 34.0 pg   MCHC 33.9  30.0 - 36.0 g/dL   RDW 40.9  81.1 - 91.4 %   Platelets 284  150 - 400 K/uL   Neutrophils Relative 89 (*) 43 - 77 %   Neutro Abs 11.1 (*) 1.7 - 7.7 K/uL   Lymphocytes Relative 7 (*) 12 - 46 %   Lymphs Abs 0.9  0.7 - 4.0 K/uL   Monocytes Relative 4  3 - 12 %   Monocytes Absolute 0.5  0.1 - 1.0 K/uL   Eosinophils Relative 0  0 - 5 %   Eosinophils Absolute 0.0  0.0 - 0.7 K/uL   Basophils Relative 0  0 - 1 %   Basophils Absolute 0.0  0.0 - 0.1 K/uL  BASIC METABOLIC PANEL     Status:  Abnormal   Collection Time   04/22/12  6:25 PM      Component Value Range   Sodium 134 (*) 135 - 145 mEq/L   Potassium 3.9  3.5 - 5.1 mEq/L   Chloride 93 (*) 96 - 112 mEq/L   CO2 26  19 - 32 mEq/L   Glucose, Bld 297 (*) 70 - 99 mg/dL   BUN 14  6 - 23 mg/dL   Creatinine, Ser 7.82  0.50 - 1.10 mg/dL   Calcium 9.5  8.4 - 95.6 mg/dL   GFR calc non Af Amer 83 (*) >90 mL/min   GFR calc Af Amer >90  >90 mL/min  PROTIME-INR     Status: Normal   Collection Time  04/22/12  6:25 PM      Component Value Range   Prothrombin Time 13.2  11.6 - 15.2 seconds   INR 0.98  0.00 - 1.49  URINALYSIS, ROUTINE W REFLEX MICROSCOPIC     Status: Abnormal   Collection Time   04/22/12  7:40 PM      Component Value Range   Color, Urine YELLOW  YELLOW   APPearance CLOUDY (*) CLEAR   Specific Gravity, Urine 1.022  1.005 - 1.030   pH 5.5  5.0 - 8.0   Glucose, UA >1000 (*) NEGATIVE mg/dL   Hgb urine dipstick NEGATIVE  NEGATIVE   Bilirubin Urine NEGATIVE  NEGATIVE   Ketones, ur 40 (*) NEGATIVE mg/dL   Protein, ur NEGATIVE  NEGATIVE mg/dL   Urobilinogen, UA 0.2  0.0 - 1.0 mg/dL   Nitrite NEGATIVE  NEGATIVE   Leukocytes, UA TRACE (*) NEGATIVE  URINE MICROSCOPIC-ADD ON     Status: Normal   Collection Time   04/22/12  7:40 PM      Component Value Range   Squamous Epithelial / LPF RARE  RARE   WBC, UA 0-2  <3 WBC/hpf  CARDIAC PANEL(CRET KIN+CKTOT+MB+TROPI)     Status: Normal   Collection Time   04/22/12  9:00 PM      Component Value Range   Total CK 41  7 - 177 U/L   CK, MB 2.9  0.3 - 4.0 ng/mL   Troponin I <0.30  <0.30 ng/mL   Relative Index RELATIVE INDEX IS INVALID  0.0 - 2.5  COMPREHENSIVE METABOLIC PANEL     Status: Abnormal   Collection Time   04/22/12  9:00 PM      Component Value Range   Sodium 136  135 - 145 mEq/L   Potassium 3.9  3.5 - 5.1 mEq/L   Chloride 97  96 - 112 mEq/L   CO2 26  19 - 32 mEq/L   Glucose, Bld 313 (*) 70 - 99 mg/dL   BUN 14  6 - 23 mg/dL   Creatinine, Ser 1.61  0.50 - 1.10  mg/dL   Calcium 9.0  8.4 - 09.6 mg/dL   Total Protein 6.2  6.0 - 8.3 g/dL   Albumin 3.4 (*) 3.5 - 5.2 g/dL   AST 13  0 - 37 U/L   ALT 9  0 - 35 U/L   Alkaline Phosphatase 64  39 - 117 U/L   Total Bilirubin 0.4  0.3 - 1.2 mg/dL   GFR calc non Af Amer 81 (*) >90 mL/min   GFR calc Af Amer >90  >90 mL/min  MRSA PCR SCREENING     Status: Normal   Collection Time   04/22/12  9:45 PM      Component Value Range   MRSA by PCR NEGATIVE  NEGATIVE  HEMOGLOBIN A1C     Status: Abnormal   Collection Time   04/22/12 11:35 PM      Component Value Range   Hemoglobin A1C 8.6 (*) <5.7 %   Mean Plasma Glucose 200 (*) <117 mg/dL  CARDIAC PANEL(CRET KIN+CKTOT+MB+TROPI)     Status: Normal   Collection Time   04/22/12 11:36 PM      Component Value Range   Total CK 60  7 - 177 U/L   CK, MB 3.4  0.3 - 4.0 ng/mL   Troponin I <0.30  <0.30 ng/mL   Relative Index RELATIVE INDEX IS INVALID  0.0 - 2.5  CARDIAC PANEL(CRET KIN+CKTOT+MB+TROPI)  Status: Normal   Collection Time   04/23/12  3:50 AM      Component Value Range   Total CK 74  7 - 177 U/L   CK, MB 3.7  0.3 - 4.0 ng/mL   Troponin I <0.30  <0.30 ng/mL   Relative Index RELATIVE INDEX IS INVALID  0.0 - 2.5  COMPREHENSIVE METABOLIC PANEL     Status: Abnormal   Collection Time   04/23/12  3:50 AM      Component Value Range   Sodium 138  135 - 145 mEq/L   Potassium 3.8  3.5 - 5.1 mEq/L   Chloride 101  96 - 112 mEq/L   CO2 25  19 - 32 mEq/L   Glucose, Bld 234 (*) 70 - 99 mg/dL   BUN 14  6 - 23 mg/dL   Creatinine, Ser 2.13  0.50 - 1.10 mg/dL   Calcium 8.7  8.4 - 08.6 mg/dL   Total Protein 5.7 (*) 6.0 - 8.3 g/dL   Albumin 3.1 (*) 3.5 - 5.2 g/dL   AST 11  0 - 37 U/L   ALT 7  0 - 35 U/L   Alkaline Phosphatase 59  39 - 117 U/L   Total Bilirubin 0.5  0.3 - 1.2 mg/dL   GFR calc non Af Amer 83 (*) >90 mL/min   GFR calc Af Amer >90  >90 mL/min  CBC     Status: Abnormal   Collection Time   04/23/12  3:50 AM      Component Value Range   WBC 10.1  4.0 -  10.5 K/uL   RBC 3.98  3.87 - 5.11 MIL/uL   Hemoglobin 11.4 (*) 12.0 - 15.0 g/dL   HCT 57.8 (*) 46.9 - 62.9 %   MCV 84.2  78.0 - 100.0 fL   MCH 28.6  26.0 - 34.0 pg   MCHC 34.0  30.0 - 36.0 g/dL   RDW 52.8  41.3 - 24.4 %   Platelets 270  150 - 400 K/uL  LIPID PANEL     Status: Abnormal   Collection Time   04/23/12  3:50 AM      Component Value Range   Cholesterol 228 (*) 0 - 200 mg/dL   Triglycerides 010  <272 mg/dL   HDL 51  >53 mg/dL   Total CHOL/HDL Ratio 4.5     VLDL 24  0 - 40 mg/dL   LDL Cholesterol 664 (*) 0 - 99 mg/dL  GLUCOSE, CAPILLARY     Status: Abnormal   Collection Time   04/23/12 11:37 AM      Component Value Range   Glucose-Capillary 189 (*) 70 - 99 mg/dL   Comment 1 Notify RN      Studies/Results: Dg Lumbar Spine Complete  04/22/2012  *RADIOLOGY REPORT*  Clinical Data: Fall, low back pain.  LUMBAR SPINE - COMPLETE 4+ VIEW  Comparison: 10/28/2011  Findings: Moderate leftward scoliosis of the lumbar spine. Associated degenerative disc disease.  Diffuse degenerative facet disease.  Transitional anatomy at the lumbar sacral junction. Moderate compression fracture through L2, slightly progressed since prior study.  No new fracture.  Slight anterolisthesis of L4 on L5, related to facet disease.  SI joints are symmetric and unremarkable.  IMPRESSION: Levoscoliosis.  Advanced degenerative disc and facet disease.  Progressive L2 compression fracture.  No acute fracture.  Original Report Authenticated By: Cyndie Chime, M.D.   Ct Head Wo Contrast  04/22/2012  *RADIOLOGY REPORT*  Clinical Data:  Fall.  Injury.  CT HEAD WITHOUT CONTRAST CT CERVICAL SPINE WITHOUT CONTRAST  Technique:  Multidetector CT imaging of the head and cervical spine was performed following the standard protocol without intravenous contrast.  Multiplanar CT image reconstructions of the cervical spine were also generated.  Comparison:   None  CT HEAD  Findings: Remote left occipital and bilateral remote parietal  infarcts noted.  There is a suggestion of small remote frontal vertex infarct and underlying chronic ischemic microvascular white matter disease.  Old infarcts involving the left basal ganglia noted.  The brainstem and cerebellum appear unremarkable.  Confluence of the white matter hypodensity in the occipital parietal region noted.  No hydrocephalus, intracranial hemorrhage, or discrete mass lesion is observed.  Atherosclerotic calcification of the carotid siphons noted.  IMPRESSION:  1.  Multiple remote scattered infarcts, without acute intracranial findings. 2.  White matter confluence of hypodensity in the parietal lobes is likely due to white matter infarcts, and less likely to be due to other entities such as posterior reversible encephalopathy or other underlying lesion.  CT CERVICAL SPINE  Findings: There is chronic failure fusion of the posterior arch of C1.  Facet arthropathy is particularly bilaterally at C2-3, C3-4, and C4-5.  Facet arthropathy is also somewhat prominent on the right at T1-2, T2-3, and T3-4.  No cervical spine fracture or acute subluxation is observed.  Loss of intervertebral disc height and posterior osseous ridging noted at C4-5, C5-6, C6-7, and T1-2.  Uncinate and facet spurring cause osseous foraminal stenosis on the left at C4-5 and C5-6, and to a lesser extent at T1-2.  Osseous foraminal narrowing on the right side is present at C3-4, C4-5, C5- 6, C6-7, and T1-2.  There is a small amount of pannus posterior to the odontoid.  IMPRESSION:  1.  Cervical spondylosis with multilevel osseous foraminal stenosis, but without fracture or acute subluxation observed. 2.  Failure fusion of the posterior arch of C1.  Original Report Authenticated By: Dellia Cloud, M.D.   Ct Cervical Spine Wo Contrast  04/22/2012  *RADIOLOGY REPORT*  Clinical Data:  Fall.  Injury.  CT HEAD WITHOUT CONTRAST CT CERVICAL SPINE WITHOUT CONTRAST  Technique:  Multidetector CT imaging of the head and  cervical spine was performed following the standard protocol without intravenous contrast.  Multiplanar CT image reconstructions of the cervical spine were also generated.  Comparison:   None  CT HEAD  Findings: Remote left occipital and bilateral remote parietal infarcts noted.  There is a suggestion of small remote frontal vertex infarct and underlying chronic ischemic microvascular white matter disease.  Old infarcts involving the left basal ganglia noted.  The brainstem and cerebellum appear unremarkable.  Confluence of the white matter hypodensity in the occipital parietal region noted.  No hydrocephalus, intracranial hemorrhage, or discrete mass lesion is observed.  Atherosclerotic calcification of the carotid siphons noted.  IMPRESSION:  1.  Multiple remote scattered infarcts, without acute intracranial findings. 2.  White matter confluence of hypodensity in the parietal lobes is likely due to white matter infarcts, and less likely to be due to other entities such as posterior reversible encephalopathy or other underlying lesion.  CT CERVICAL SPINE  Findings: There is chronic failure fusion of the posterior arch of C1.  Facet arthropathy is particularly bilaterally at C2-3, C3-4, and C4-5.  Facet arthropathy is also somewhat prominent on the right at T1-2, T2-3, and T3-4.  No cervical spine fracture or acute subluxation is observed.  Loss of intervertebral disc height and posterior  osseous ridging noted at C4-5, C5-6, C6-7, and T1-2.  Uncinate and facet spurring cause osseous foraminal stenosis on the left at C4-5 and C5-6, and to a lesser extent at T1-2.  Osseous foraminal narrowing on the right side is present at C3-4, C4-5, C5- 6, C6-7, and T1-2.  There is a small amount of pannus posterior to the odontoid.  IMPRESSION:  1.  Cervical spondylosis with multilevel osseous foraminal stenosis, but without fracture or acute subluxation observed. 2.  Failure fusion of the posterior arch of C1.  Original Report  Authenticated By: Dellia Cloud, M.D.   Mr Brain Wo Contrast  04/23/2012  *RADIOLOGY REPORT*  Clinical Data: 78year old hypertensive diabetic with episode of loss of consciousness.  MRI HEAD WITHOUT CONTRAST  Technique:  Multiplanar, multiecho pulse sequences of the brain and surrounding structures were obtained according to standard protocol without intravenous contrast.  Comparison: 04/22/2012 CT.  No comparison MR.  Findings: No acute infarct.  Remote infarcts with encephalomalacia involving the right parietal - occipital lobe, left occipital lobe and medial left temporal lobe.  Smaller remote infarcts involving the basal ganglia and corona radiata.  Moderate small vessel disease type changes.  Global atrophy without hydrocephalus.  No intracranial hemorrhage.  No intracranial mass lesion detected on this unenhanced exam.  Cervical spondylotic changes with spinal stenosis C4-5 and C5-6.  Major intracranial vascular structures are patent.  IMPRESSION: No acute infarct.  Remote infarcts, small vessel disease type changes and atrophy as noted above.  Original Report Authenticated By: Fuller Canada, M.D.   Mr Lumbar Spine Wo Contrast  04/23/2012  *RADIOLOGY REPORT*  Clinical Data: Loss of consciousness.  Back pain.  MRI LUMBAR SPINE WITHOUT CONTRAST  Technique:  Multiplanar and multiecho pulse sequences of the lumbar spine were obtained without intravenous contrast.  Comparison: 04/22/2012 and 10/28/2011 plain film examination of the lumbar spine.  No comparison MR.  Findings: Transitional vertebra has been labeled S1 on prior plain film examination.  Utilizing this level assignment, on the present examination incorporates from the T10-11 disc space through the lower sacrum.  Conus L1-2 level.  Fracture of the sacrum incompletely assessed on the present exam.  Anterior wedge compression fracture of the L2 vertebral body involving superior endplate with 60% loss of height anteriorly. Retropulsion of the  posterior-superior aspect of the compressed vertebra combined with broad-based disc protrusion and facet joint degenerative changes is causing L1-2 mild to slightly moderate spinal stenosis and mild bilateral foraminal narrowing. Compression fracture of L2 is noted on the 10/28/2011 plain film examination but has progressed (edema is seen throughout the vertebral body as well small amount of edema within the adjacent L1 inferior endplate).  Widening of the facet joints.  T10-11:  Negative.  T11-12:  Minimal Schmorl's node deformity.  T12-L1:  Very small Schmorl's node deformity.  L1-2:  As above.  L2-3:  Facet joint degenerative changes.  Ligamentum flavum hypertrophy.  Mild bulge.  Very mild spinal stenosis and foraminal narrowing.  L3-4:  Facet joint degenerative changes and ligamentum flavum hypertrophy greater on the right.  Prominent disc degeneration with broad-based disc osteophyte right lateral position approaching but not compressing the exiting right L3 nerve root.  Mild spinal stenosis greater on the right.  L4-5:  Prominent bilateral facet joint degenerative changes. Ligamentum flavum hypertrophy.  Minimal anterior slip L4. Bulge/osteophyte greater to the right with mild impression upon the exiting right L4 nerve root.  Multifactorial severe spinal stenosis.  L5-S1:  Prominent facet joint degenerative  changes and ligamentum flavum hypertrophy greater on the left.  Moderate bulge/broad-based protrusion osteophyte greater to the left and centrally.  Mild encroaching upon the exiting left L5 nerve root.  Marked left-sided and moderate right-sided lateral recess stenosis.  Moderate spinal stenosis greater on the left.  S1-S2:  Pars defects.  Bony overgrowth.  Minimal anterior slip S1. Bulge/small protrusion with slight cephalad extension.  Lateral osteophyte greater to the right encroaching upon the exiting nerve root.  IMPRESSION:  Transitional vertebra has been labeled S1 on prior plain film examination.   Fracture of the sacrum incompletely assessed on the present exam.  Anterior wedge compression fracture of the L2 vertebral body involving superior endplate with 60% loss of height anteriorly. Retropulsion of the posterior-superior aspect of the compressed vertebra combined with broad-based disc protrusion and facet joint degenerative changes is causing L1-2 mild to slightly moderate spinal stenosis and mild bilateral foraminal narrowing. Compression fracture of L2 is noted on the 10/28/2011 plain film examination but has progressed.  L3-4 broad-based disc osteophyte right lateral position approaching but not compressing the exiting right L3 nerve root.  Mild spinal stenosis greater on the right.  L4-5 bulge/osteophyte greater to the right with mild impression upon the exiting right L4 nerve root.  Multifactorial severe spinal stenosis.  L5-S1 moderate bulge/broad-based protrusion osteophyte greater to the left and centrally.  Mild encroaching upon the exiting left L5 nerve root.  Marked left-sided and moderate right-sided lateral recess stenosis.  Moderate spinal stenosis greater on the left.  S1-S2 prior defects.  Bony overgrowth.  Minimal anterior slip S1. Bulge/small protrusion with slight cephalad extension.  Lateral osteophyte greater to the right encroaching upon the exiting nerve root.  Please see above further detail.  This has been made a PRA call report utilizing dashboard call feature.  Original Report Authenticated By: Fuller Canada, M.D.   Dg Chest Portable 1 View  04/22/2012  *RADIOLOGY REPORT*  Clinical Data: Fall, back pain.  PORTABLE CHEST - 1 VIEW  Comparison: None  Findings: Mild biapical scarring.  Lungs otherwise clear.  Heart is normal size.  No effusions.  No acute bony abnormality.  IMPRESSION: No active cardiopulmonary disease.  Original Report Authenticated By: Cyndie Chime, M.D.    Medications: Reviewed  Tele:  No signif pauses.   Patient Active Hospital Problem  List:  Syncope (04/22/2012)   Assessment: No further spells  Patient says she feels a little woozy.  Does get woozy at home but keeps working. Echo shows normal LV systolic function.  Moderate AS with mean gradient of 26 mm Hg.  Mild MS.  I do not think this explains syncopal spell I have reviewed strips with Odessa Fleming regarding possible pacer  He plans to see patient  Murmur Echo with moderate AS, mild MS>  Hyperlipidemia.  Evidence of atherosclerosis on scans.  Should be on a statin.  Rx [email or web address] Dietrich Pates 04/23/2012, 3:24 PM

## 2012-04-23 NOTE — Consult Note (Signed)
Referring Physician: Toniann Fail Primary Cardiologist: None Reason for Consultation: Syncope, Heart murmur, and >3 second pause (sinus arrest)  HPI: 34 YOWF with PMH of CVA, DM, and Dementia who presents as a consult for evaluation of syncope.  At baseline, the patient is functional and lives at home with her husband.  She is able to do her ADLs without assistance.  Today, she was in her usual state of health when she had a syncopal episode.  She remembers going to walk outside and then remembers waking up when EMS had arrived.  She denies CP or palpitations before this.  She has had no episodes of syncope.  She denies angina or HF; however, she is somewhat of an unreliable historian due to her dementia. She is not on any AV nodal blockers and there are no CVA symptoms.   In the ED, she was noted to have 3.5 second pause with sinus arrest.  Also, she was noted to have a loud murmur.  Therefore, cardiology was consulted.    Past Medical History  Diagnosis Date  . Diabetes mellitus   . Dementia   . Stroke     History   Social History  . Marital Status: Married    Spouse Name: N/A    Number of Children: N/A  . Years of Education: N/A   Occupational History  . Not on file.   Social History Main Topics  . Smoking status: Never Smoker   . Smokeless tobacco: Not on file  . Alcohol Use: No  . Drug Use: No  . Sexually Active:    Other Topics Concern  . Not on file   Social History Narrative  . No narrative on file    FH: No history of pacemakers.   Medications Prior to Admission  Medication Sig Dispense Refill  . amLODipine (NORVASC) 5 MG tablet Take 5 mg by mouth daily.      . clopidogrel (PLAVIX) 75 MG tablet Take 75 mg by mouth daily.      Marland Kitchen glipiZIDE (GLUCOTROL) 10 MG tablet Take 10 mg by mouth 2 (two) times daily before a meal.      . sitaGLIPtin (JANUVIA) 100 MG tablet Take 100 mg by mouth daily.        Allergies  Allergen Reactions  . Penicillins     Review of  Systems: All systems reviewed and are negative except as mentioned above in the history of present illness.    PHYSICAL EXAM: Filed Vitals:   04/22/12 2200  BP: 141/55  Pulse: 65  Temp: 98 F (36.7 C)  Resp: 13   GENERAL: No acute distress.   HEENT: Normocephalic, atraumatic.  Oropharynx is pink and moist without lesions.  NECK: Supple, no LAD, no JVD, no masses. CV: Regular rate and rhythm with harsh, late peaking 3/6 SEM across the precordium, muffled A2.    LUNGS: Clear to auscultation bilaterally.   ABDOMEN: +BS, soft, nontender, nondistended.  EXTREMITIES: No clubbing, cyanosis, or edema.   NEURO: AO x 3, no focal deficits. PYSCH: Normal affect. SKIN: No rashes.     ECG: NSR at 63 BPM, normal intervals, normal axis  TELE: sinus mechanism with episode of sinus arrest with 2 narrow complexes escape complexes with an escape interval of 3.5 seconds.   Results for orders placed during the hospital encounter of 04/22/12 (from the past 24 hour(s))  GLUCOSE, CAPILLARY     Status: Abnormal   Collection Time   04/22/12  5:50 PM  Component Value Range   Glucose-Capillary 285 (*) 70 - 99 mg/dL  TROPONIN I     Status: Normal   Collection Time   04/22/12  6:24 PM      Component Value Range   Troponin I <0.30  <0.30 ng/mL  CBC WITH DIFFERENTIAL     Status: Abnormal   Collection Time   04/22/12  6:25 PM      Component Value Range   WBC 12.5 (*) 4.0 - 10.5 K/uL   RBC 4.58  3.87 - 5.11 MIL/uL   Hemoglobin 13.1  12.0 - 15.0 g/dL   HCT 09.8  11.9 - 14.7 %   MCV 84.3  78.0 - 100.0 fL   MCH 28.6  26.0 - 34.0 pg   MCHC 33.9  30.0 - 36.0 g/dL   RDW 82.9  56.2 - 13.0 %   Platelets 284  150 - 400 K/uL   Neutrophils Relative 89 (*) 43 - 77 %   Neutro Abs 11.1 (*) 1.7 - 7.7 K/uL   Lymphocytes Relative 7 (*) 12 - 46 %   Lymphs Abs 0.9  0.7 - 4.0 K/uL   Monocytes Relative 4  3 - 12 %   Monocytes Absolute 0.5  0.1 - 1.0 K/uL   Eosinophils Relative 0  0 - 5 %   Eosinophils Absolute  0.0  0.0 - 0.7 K/uL   Basophils Relative 0  0 - 1 %   Basophils Absolute 0.0  0.0 - 0.1 K/uL  BASIC METABOLIC PANEL     Status: Abnormal   Collection Time   04/22/12  6:25 PM      Component Value Range   Sodium 134 (*) 135 - 145 mEq/L   Potassium 3.9  3.5 - 5.1 mEq/L   Chloride 93 (*) 96 - 112 mEq/L   CO2 26  19 - 32 mEq/L   Glucose, Bld 297 (*) 70 - 99 mg/dL   BUN 14  6 - 23 mg/dL   Creatinine, Ser 8.65  0.50 - 1.10 mg/dL   Calcium 9.5  8.4 - 78.4 mg/dL   GFR calc non Af Amer 83 (*) >90 mL/min   GFR calc Af Amer >90  >90 mL/min  PROTIME-INR     Status: Normal   Collection Time   04/22/12  6:25 PM      Component Value Range   Prothrombin Time 13.2  11.6 - 15.2 seconds   INR 0.98  0.00 - 1.49  URINALYSIS, ROUTINE W REFLEX MICROSCOPIC     Status: Abnormal   Collection Time   04/22/12  7:40 PM      Component Value Range   Color, Urine YELLOW  YELLOW   APPearance CLOUDY (*) CLEAR   Specific Gravity, Urine 1.022  1.005 - 1.030   pH 5.5  5.0 - 8.0   Glucose, UA >1000 (*) NEGATIVE mg/dL   Hgb urine dipstick NEGATIVE  NEGATIVE   Bilirubin Urine NEGATIVE  NEGATIVE   Ketones, ur 40 (*) NEGATIVE mg/dL   Protein, ur NEGATIVE  NEGATIVE mg/dL   Urobilinogen, UA 0.2  0.0 - 1.0 mg/dL   Nitrite NEGATIVE  NEGATIVE   Leukocytes, UA TRACE (*) NEGATIVE  URINE MICROSCOPIC-ADD ON     Status: Normal   Collection Time   04/22/12  7:40 PM      Component Value Range   Squamous Epithelial / LPF RARE  RARE   WBC, UA 0-2  <3 WBC/hpf  CARDIAC PANEL(CRET KIN+CKTOT+MB+TROPI)  Status: Normal   Collection Time   04/22/12  9:00 PM      Component Value Range   Total CK 41  7 - 177 U/L   CK, MB 2.9  0.3 - 4.0 ng/mL   Troponin I <0.30  <0.30 ng/mL   Relative Index RELATIVE INDEX IS INVALID  0.0 - 2.5  COMPREHENSIVE METABOLIC PANEL     Status: Abnormal   Collection Time   04/22/12  9:00 PM      Component Value Range   Sodium 136  135 - 145 mEq/L   Potassium 3.9  3.5 - 5.1 mEq/L   Chloride 97  96 -  112 mEq/L   CO2 26  19 - 32 mEq/L   Glucose, Bld 313 (*) 70 - 99 mg/dL   BUN 14  6 - 23 mg/dL   Creatinine, Ser 4.54  0.50 - 1.10 mg/dL   Calcium 9.0  8.4 - 09.8 mg/dL   Total Protein 6.2  6.0 - 8.3 g/dL   Albumin 3.4 (*) 3.5 - 5.2 g/dL   AST 13  0 - 37 U/L   ALT 9  0 - 35 U/L   Alkaline Phosphatase 64  39 - 117 U/L   Total Bilirubin 0.4  0.3 - 1.2 mg/dL   GFR calc non Af Amer 81 (*) >90 mL/min   GFR calc Af Amer >90  >90 mL/min  MRSA PCR SCREENING     Status: Normal   Collection Time   04/22/12  9:45 PM      Component Value Range   MRSA by PCR NEGATIVE  NEGATIVE   Dg Lumbar Spine Complete  04/22/2012  *RADIOLOGY REPORT*  Clinical Data: Fall, low back pain.  LUMBAR SPINE - COMPLETE 4+ VIEW  Comparison: 10/28/2011  Findings: Moderate leftward scoliosis of the lumbar spine. Associated degenerative disc disease.  Diffuse degenerative facet disease.  Transitional anatomy at the lumbar sacral junction. Moderate compression fracture through L2, slightly progressed since prior study.  No new fracture.  Slight anterolisthesis of L4 on L5, related to facet disease.  SI joints are symmetric and unremarkable.  IMPRESSION: Levoscoliosis.  Advanced degenerative disc and facet disease.  Progressive L2 compression fracture.  No acute fracture.  Original Report Authenticated By: Cyndie Chime, M.D.   Ct Head Wo Contrast  04/22/2012  *RADIOLOGY REPORT*  Clinical Data:  Fall.  Injury.  CT HEAD WITHOUT CONTRAST CT CERVICAL SPINE WITHOUT CONTRAST  Technique:  Multidetector CT imaging of the head and cervical spine was performed following the standard protocol without intravenous contrast.  Multiplanar CT image reconstructions of the cervical spine were also generated.  Comparison:   None  CT HEAD  Findings: Remote left occipital and bilateral remote parietal infarcts noted.  There is a suggestion of small remote frontal vertex infarct and underlying chronic ischemic microvascular white matter disease.  Old  infarcts involving the left basal ganglia noted.  The brainstem and cerebellum appear unremarkable.  Confluence of the white matter hypodensity in the occipital parietal region noted.  No hydrocephalus, intracranial hemorrhage, or discrete mass lesion is observed.  Atherosclerotic calcification of the carotid siphons noted.  IMPRESSION:  1.  Multiple remote scattered infarcts, without acute intracranial findings. 2.  White matter confluence of hypodensity in the parietal lobes is likely due to white matter infarcts, and less likely to be due to other entities such as posterior reversible encephalopathy or other underlying lesion.  CT CERVICAL SPINE  Findings: There is chronic failure fusion of  the posterior arch of C1.  Facet arthropathy is particularly bilaterally at C2-3, C3-4, and C4-5.  Facet arthropathy is also somewhat prominent on the right at T1-2, T2-3, and T3-4.  No cervical spine fracture or acute subluxation is observed.  Loss of intervertebral disc height and posterior osseous ridging noted at C4-5, C5-6, C6-7, and T1-2.  Uncinate and facet spurring cause osseous foraminal stenosis on the left at C4-5 and C5-6, and to a lesser extent at T1-2.  Osseous foraminal narrowing on the right side is present at C3-4, C4-5, C5- 6, C6-7, and T1-2.  There is a small amount of pannus posterior to the odontoid.  IMPRESSION:  1.  Cervical spondylosis with multilevel osseous foraminal stenosis, but without fracture or acute subluxation observed. 2.  Failure fusion of the posterior arch of C1.  Original Report Authenticated By: Dellia Cloud, M.D.   Ct Cervical Spine Wo Contrast  04/22/2012  *RADIOLOGY REPORT*  Clinical Data:  Fall.  Injury.  CT HEAD WITHOUT CONTRAST CT CERVICAL SPINE WITHOUT CONTRAST  Technique:  Multidetector CT imaging of the head and cervical spine was performed following the standard protocol without intravenous contrast.  Multiplanar CT image reconstructions of the cervical spine were  also generated.  Comparison:   None  CT HEAD  Findings: Remote left occipital and bilateral remote parietal infarcts noted.  There is a suggestion of small remote frontal vertex infarct and underlying chronic ischemic microvascular white matter disease.  Old infarcts involving the left basal ganglia noted.  The brainstem and cerebellum appear unremarkable.  Confluence of the white matter hypodensity in the occipital parietal region noted.  No hydrocephalus, intracranial hemorrhage, or discrete mass lesion is observed.  Atherosclerotic calcification of the carotid siphons noted.  IMPRESSION:  1.  Multiple remote scattered infarcts, without acute intracranial findings. 2.  White matter confluence of hypodensity in the parietal lobes is likely due to white matter infarcts, and less likely to be due to other entities such as posterior reversible encephalopathy or other underlying lesion.  CT CERVICAL SPINE  Findings: There is chronic failure fusion of the posterior arch of C1.  Facet arthropathy is particularly bilaterally at C2-3, C3-4, and C4-5.  Facet arthropathy is also somewhat prominent on the right at T1-2, T2-3, and T3-4.  No cervical spine fracture or acute subluxation is observed.  Loss of intervertebral disc height and posterior osseous ridging noted at C4-5, C5-6, C6-7, and T1-2.  Uncinate and facet spurring cause osseous foraminal stenosis on the left at C4-5 and C5-6, and to a lesser extent at T1-2.  Osseous foraminal narrowing on the right side is present at C3-4, C4-5, C5- 6, C6-7, and T1-2.  There is a small amount of pannus posterior to the odontoid.  IMPRESSION:  1.  Cervical spondylosis with multilevel osseous foraminal stenosis, but without fracture or acute subluxation observed. 2.  Failure fusion of the posterior arch of C1.  Original Report Authenticated By: Dellia Cloud, M.D.   Dg Chest Portable 1 View  04/22/2012  *RADIOLOGY REPORT*  Clinical Data: Fall, back pain.  PORTABLE CHEST -  1 VIEW  Comparison: None  Findings: Mild biapical scarring.  Lungs otherwise clear.  Heart is normal size.  No effusions.  No acute bony abnormality.  IMPRESSION: No active cardiopulmonary disease.  Original Report Authenticated By: Cyndie Chime, M.D.   ASSESSMENT and PLAN: 6 YOWF with PMH of Dementia, DM, CVA who presents for evaluation of syncope.  Her syncope could be due to severe  aortic stenosis (by exam) or a primary bradycardic arrhythmia (based on tele findings).  Recommend monitoring on tele tonight.  Avoid any AV nodal blocking agents.  Check TTE to assess murmur - I suspect that this is at least moderate (if not severe) aortic stenosis.  If she has severe AS, then she needs to be evaluate for advanced therapies for AS (SAVR vs. TAVR).  If the AS is not severe, then she needs to be worked up for a brady arrythmia and may need a PPM.  Cardiology will follow along with you.   Gwendalyn Ege, M.D. Level 5 Consult

## 2012-04-23 NOTE — Progress Notes (Signed)
  Echocardiogram 2D Echocardiogram has been performed.  Lorraine Tran 04/23/2012, 10:24 AM

## 2012-04-23 NOTE — Consult Note (Signed)
Electrophysiology Consult Note    Patient ID: Lorraine Tran MRN: [id number], DOB/AGE: [id number] 76 y.o.  Admit date: 04/22/2012 Date of Consult: 04/23/2012  Primary Physician: Sheila Oats, MD Primary Cardiologist: new  Chief Complaint: syncope Reason for Consultation: bracdycardia  HPI: Lorraine Tran is an 78 yo woman with a long history of DM and orthostatic lightheadedness and presyncope.  Yesterday she was went to the house to get some of her husband walked out on the porch and then collapsed. This is one of multiple falls and she has recurred over the years. Some of them she has been aware of some of that she simply finds herself on the ground. She feels a little bit as she tells her history but she says that she has been falling for 15 years. She is long-standing diabetes.  On arrival into the emergency room she was noted to have a pause of more than 3 seconds. It was notable that with her episode yesterday of syncope, there was significant nausea and subsequent vomiting that occurred also. Is not clear to me from the notes as to whether the symptoms were ongoing at the time she was admitted through the emergency room  She has an unclear history of coronary artery disease for which she is on Plavix presumably a stent.  Past Medical History  Diagnosis Date  . Diabetes mellitus   . Dementia   . Stroke       Surgical History:  Past Surgical History  Procedure Date  . Abdominal hysterectomy   . Cholecystectomy      Prescriptions prior to admission  Medication Sig Dispense Refill  . amLODipine (NORVASC) 5 MG tablet Take 5 mg by mouth daily.      . clopidogrel (PLAVIX) 75 MG tablet Take 75 mg by mouth daily.      Marland Kitchen glipiZIDE (GLUCOTROL) 10 MG tablet Take 10 mg by mouth 2 (two) times daily before a meal.      . sitaGLIPtin (JANUVIA) 100 MG tablet Take 100 mg by mouth daily.        Inpatient Medications:    . amLODipine  5 mg Oral Daily  . atorvastatin  10 mg  Oral q1800  . clopidogrel  75 mg Oral QAC breakfast  . glipiZIDE  10 mg Oral BID AC  . insulin aspart  0-9 Units Subcutaneous TID WC  . linagliptin  5 mg Oral Daily  .  morphine injection  2 mg Intravenous Once  . ondansetron (ZOFRAN) IV  4 mg Intravenous Once  . pneumococcal 23 valent vaccine  0.5 mL Intramuscular Tomorrow-1000  . white petrolatum        Allergies:  Allergies  Allergen Reactions  . Penicillins     History   Social History  . Marital Status: Married    Spouse Name: N/A    Number of Children: N/A  . Years of Education: N/A   Occupational History  . Not on file.   Social History Main Topics  . Smoking status: Never Smoker   . Smokeless tobacco: Not on file  . Alcohol Use: No  . Drug Use: No  . Sexually Active:    Other Topics Concern  . Not on file   Social History Narrative  . No narrative on file     History reviewed. No pertinent family history.      Review of Systems:   Per Dr Malachy Mood notes  Physical Exam:  Blood pressure 122/45, pulse 71, temperature 97.8 F (36.6 C), temperature  source Oral, resp. rate 17, height 5\' 5"  (1.651 m), weight 116 lb 2.9 oz (52.7 kg), SpO2 94.00%.  General appearance: alert, cooperative, appears stated age and no distress HEENT:PERRLA Neck: no adenopathy, supple, symmetrical, trachea midline and thyroid not enlarged, symmetric, no tenderness/mass/nodules Back: symmetric, no curvature. ROM normal. No CVA tenderness., kyphosis present Resp: clear to auscultation bilaterally Chest wall: no tenderness Cardio:regular rate & rhythm, S1 normal and 3/6 systolic ejection low pitched harsh murmur LLSB GI: soft, non-tender; bowel sounds normal; no masses,  no organomegaly Extremities: extremities normal, atraumatic, no cyanosis or edema Pulses: 2+ and symmetric Skin: Skin color, texture, turgor normal. No rashes or lesions Lymph nodes: Cervical, supraclavicular, and axillary nodes normal. Neuro:Alert and oriented  x3. Gait normal. Reflexes and motor strength normal and symmetric. Cranial nerves 2-12 and sensation grossly intact. Psychnormal affect   EKG: sinus with normal intervals  Basic Metabolic Panel:  Lab 04/23/12 1610 04/22/12 2100 04/22/12 1825  NA 138 136 134*  K 3.8 3.9 3.9  CL 101 97 93*  CO2 25 26 26   GLUCOSE 234* 313* 297*  BUN 14 14 14   CREATININE 0.52 0.56 0.52  CALCIUM 8.7 9.0 --  MG -- -- --  PHOS -- -- --   Cardiac Enzymes:  Basename 04/23/12 1527 04/23/12 0350 04/22/12 2336  CKTOTAL 211* 74 60  CKMB 6.3* 3.7 3.4  CKMBINDEX -- -- --  TROPONINI <0.30 <0.30 <0.30   CBC:  Lab 04/23/12 0350 04/22/12 1825  WBC 10.1 12.5*  NEUTROABS -- 11.1*  HGB 11.4* 13.1  HCT 33.5* 38.6  MCV 84.2 84.3  PLT 270 284   PROTIME: No results found for this basename: PROTIME in the last 72 hours BNP No components found with this basename: POCBNP:3 Liver Function Tests:  Basename 04/23/12 0350 04/22/12 2100  AST 11 13  ALT 7 9  ALKPHOS 59 64  BILITOT 0.5 0.4  PROT 5.7* 6.2  ALBUMIN 3.1* 3.4*   D-Dimer: No results found for this basename: DDIMER:2 in the last 72 hours Hemoglobin A1C:  Basename 04/22/12 2335  HGBA1C 8.6*   Fasting Lipid Panel:  Basename 04/23/12 0350  CHOL 228*  HDL 51  LDLCALC 153*  TRIG 120  CHOLHDL 4.5  LDLDIRECT --   Thyroid Function Tests: No results found for this basename: TSH,T4TOTAL,FREET3,T3FREE,THYROIDAB in the last 72 hours Anemia Panel: No results found for this basename: VITAMINB12,FOLATE,FERRITIN,TIBC,IRON,RETICCTPCT in the last 72 hours     Radiology/Studies: Dg Lumbar Spine Complete  04/22/2012  *RADIOLOGY REPORT*  Clinical Data: Fall, low back pain.  LUMBAR SPINE - COMPLETE 4+ VIEW  Comparison: 10/28/2011  Findings: Moderate leftward scoliosis of the lumbar spine. Associated degenerative disc disease.  Diffuse degenerative facet disease.  Transitional anatomy at the lumbar sacral junction. Moderate compression fracture through  L2, slightly progressed since prior study.  No new fracture.  Slight anterolisthesis of L4 on L5, related to facet disease.  SI joints are symmetric and unremarkable.  IMPRESSION: Levoscoliosis.  Advanced degenerative disc and facet disease.  Progressive L2 compression fracture.  No acute fracture.  Original Report Authenticated By: Cyndie Chime, M.D.   Ct Head Wo Contrast  04/22/2012  *RADIOLOGY REPORT*  Clinical Data:  Fall.  Injury.  CT HEAD WITHOUT CONTRAST CT CERVICAL SPINE WITHOUT CONTRAST  Technique:  Multidetector CT imaging of the head and cervical spine was performed following the standard protocol without intravenous contrast.  Multiplanar CT image reconstructions of the cervical spine were also generated.  Comparison:   None  CT HEAD  Findings: Remote left occipital and bilateral remote parietal infarcts noted.  There is a suggestion of small remote frontal vertex infarct and underlying chronic ischemic microvascular white matter disease.  Old infarcts involving the left basal ganglia noted.  The brainstem and cerebellum appear unremarkable.  Confluence of the white matter hypodensity in the occipital parietal region noted.  No hydrocephalus, intracranial hemorrhage, or discrete mass lesion is observed.  Atherosclerotic calcification of the carotid siphons noted.  IMPRESSION:  1.  Multiple remote scattered infarcts, without acute intracranial findings. 2.  White matter confluence of hypodensity in the parietal lobes is likely due to white matter infarcts, and less likely to be due to other entities such as posterior reversible encephalopathy or other underlying lesion.  CT CERVICAL SPINE  Findings: There is chronic failure fusion of the posterior arch of C1.  Facet arthropathy is particularly bilaterally at C2-3, C3-4, and C4-5.  Facet arthropathy is also somewhat prominent on the right at T1-2, T2-3, and T3-4.  No cervical spine fracture or acute subluxation is observed.  Loss of intervertebral  disc height and posterior osseous ridging noted at C4-5, C5-6, C6-7, and T1-2.  Uncinate and facet spurring cause osseous foraminal stenosis on the left at C4-5 and C5-6, and to a lesser extent at T1-2.  Osseous foraminal narrowing on the right side is present at C3-4, C4-5, C5- 6, C6-7, and T1-2.  There is a small amount of pannus posterior to the odontoid.  IMPRESSION:  1.  Cervical spondylosis with multilevel osseous foraminal stenosis, but without fracture or acute subluxation observed. 2.  Failure fusion of the posterior arch of C1.  Original Report Authenticated By: Dellia Cloud, M.D.   Ct Cervical Spine Wo Contrast  04/22/2012  *RADIOLOGY REPORT*  Clinical Data:  Fall.  Injury.  CT HEAD WITHOUT CONTRAST CT CERVICAL SPINE WITHOUT CONTRAST  Technique:  Multidetector CT imaging of the head and cervical spine was performed following the standard protocol without intravenous contrast.  Multiplanar CT image reconstructions of the cervical spine were also generated.  Comparison:   None  CT HEAD  Findings: Remote left occipital and bilateral remote parietal infarcts noted.  There is a suggestion of small remote frontal vertex infarct and underlying chronic ischemic microvascular white matter disease.  Old infarcts involving the left basal ganglia noted.  The brainstem and cerebellum appear unremarkable.  Confluence of the white matter hypodensity in the occipital parietal region noted.  No hydrocephalus, intracranial hemorrhage, or discrete mass lesion is observed.  Atherosclerotic calcification of the carotid siphons noted.  IMPRESSION:  1.  Multiple remote scattered infarcts, without acute intracranial findings. 2.  White matter confluence of hypodensity in the parietal lobes is likely due to white matter infarcts, and less likely to be due to other entities such as posterior reversible encephalopathy or other underlying lesion.  CT CERVICAL SPINE  Findings: There is chronic failure fusion of the  posterior arch of C1.  Facet arthropathy is particularly bilaterally at C2-3, C3-4, and C4-5.  Facet arthropathy is also somewhat prominent on the right at T1-2, T2-3, and T3-4.  No cervical spine fracture or acute subluxation is observed.  Loss of intervertebral disc height and posterior osseous ridging noted at C4-5, C5-6, C6-7, and T1-2.  Uncinate and facet spurring cause osseous foraminal stenosis on the left at C4-5 and C5-6, and to a lesser extent at T1-2.  Osseous foraminal narrowing on the right side is present at C3-4, C4-5, C5- 6, C6-7, and T1-2.  There is a small amount of pannus posterior to the odontoid.  IMPRESSION:  1.  Cervical spondylosis with multilevel osseous foraminal stenosis, but without fracture or acute subluxation observed. 2.  Failure fusion of the posterior arch of C1.  Original Report Authenticated By: Dellia Cloud, M.D.   Mr Brain Wo Contrast  04/23/2012  *RADIOLOGY REPORT*  Clinical Data: 78year old hypertensive diabetic with episode of loss of consciousness.  MRI HEAD WITHOUT CONTRAST  Technique:  Multiplanar, multiecho pulse sequences of the brain and surrounding structures were obtained according to standard protocol without intravenous contrast.  Comparison: 04/22/2012 CT.  No comparison MR.  Findings: No acute infarct.  Remote infarcts with encephalomalacia involving the right parietal - occipital lobe, left occipital lobe and medial left temporal lobe.  Smaller remote infarcts involving the basal ganglia and corona radiata.  Moderate small vessel disease type changes.  Global atrophy without hydrocephalus.  No intracranial hemorrhage.  No intracranial mass lesion detected on this unenhanced exam.  Cervical spondylotic changes with spinal stenosis C4-5 and C5-6.  Major intracranial vascular structures are patent.  IMPRESSION: No acute infarct.  Remote infarcts, small vessel disease type changes and atrophy as noted above.  Original Report Authenticated By: Fuller Canada, M.D.   Mr Lumbar Spine Wo Contrast  04/23/2012  *RADIOLOGY REPORT*  Clinical Data: Loss of consciousness.  Back pain.  MRI LUMBAR SPINE WITHOUT CONTRAST  Technique:  Multiplanar and multiecho pulse sequences of the lumbar spine were obtained without intravenous contrast.  Comparison: 04/22/2012 and 10/28/2011 plain film examination of the lumbar spine.  No comparison MR.  Findings: Transitional vertebra has been labeled S1 on prior plain film examination.  Utilizing this level assignment, on the present examination incorporates from the T10-11 disc space through the lower sacrum.  Conus L1-2 level.  Fracture of the sacrum incompletely assessed on the present exam.  Anterior wedge compression fracture of the L2 vertebral body involving superior endplate with 60% loss of height anteriorly. Retropulsion of the posterior-superior aspect of the compressed vertebra combined with broad-based disc protrusion and facet joint degenerative changes is causing L1-2 mild to slightly moderate spinal stenosis and mild bilateral foraminal narrowing. Compression fracture of L2 is noted on the 10/28/2011 plain film examination but has progressed (edema is seen throughout the vertebral body as well small amount of edema within the adjacent L1 inferior endplate).  Widening of the facet joints.  T10-11:  Negative.  T11-12:  Minimal Schmorl's node deformity.  T12-L1:  Very small Schmorl's node deformity.  L1-2:  As above.  L2-3:  Facet joint degenerative changes.  Ligamentum flavum hypertrophy.  Mild bulge.  Very mild spinal stenosis and foraminal narrowing.  L3-4:  Facet joint degenerative changes and ligamentum flavum hypertrophy greater on the right.  Prominent disc degeneration with broad-based disc osteophyte right lateral position approaching but not compressing the exiting right L3 nerve root.  Mild spinal stenosis greater on the right.  L4-5:  Prominent bilateral facet joint degenerative changes. Ligamentum flavum  hypertrophy.  Minimal anterior slip L4. Bulge/osteophyte greater to the right with mild impression upon the exiting right L4 nerve root.  Multifactorial severe spinal stenosis.  L5-S1:  Prominent facet joint degenerative changes and ligamentum flavum hypertrophy greater on the left.  Moderate bulge/broad-based protrusion osteophyte greater to the left and centrally.  Mild encroaching upon the exiting left L5 nerve root.  Marked left-sided and moderate right-sided lateral recess stenosis.  Moderate spinal stenosis greater on the left.  S1-S2:  Pars  defects.  Bony overgrowth.  Minimal anterior slip S1. Bulge/small protrusion with slight cephalad extension.  Lateral osteophyte greater to the right encroaching upon the exiting nerve root.  IMPRESSION:  Transitional vertebra has been labeled S1 on prior plain film examination.  Fracture of the sacrum incompletely assessed on the present exam.  Anterior wedge compression fracture of the L2 vertebral body involving superior endplate with 60% loss of height anteriorly. Retropulsion of the posterior-superior aspect of the compressed vertebra combined with broad-based disc protrusion and facet joint degenerative changes is causing L1-2 mild to slightly moderate spinal stenosis and mild bilateral foraminal narrowing. Compression fracture of L2 is noted on the 10/28/2011 plain film examination but has progressed.  L3-4 broad-based disc osteophyte right lateral position approaching but not compressing the exiting right L3 nerve root.  Mild spinal stenosis greater on the right.  L4-5 bulge/osteophyte greater to the right with mild impression upon the exiting right L4 nerve root.  Multifactorial severe spinal stenosis.  L5-S1 moderate bulge/broad-based protrusion osteophyte greater to the left and centrally.  Mild encroaching upon the exiting left L5 nerve root.  Marked left-sided and moderate right-sided lateral recess stenosis.  Moderate spinal stenosis greater on the left.   S1-S2 prior defects.  Bony overgrowth.  Minimal anterior slip S1. Bulge/small protrusion with slight cephalad extension.  Lateral osteophyte greater to the right encroaching upon the exiting nerve root.  Please see above further detail.  This has been made a PRA call report utilizing dashboard call feature.  Original Report Authenticated By: Fuller Canada, M.D.   Dg Chest Portable 1 View  04/22/2012  *RADIOLOGY REPORT*  Clinical Data: Fall, back pain.  PORTABLE CHEST - 1 VIEW  Comparison: None  Findings: Mild biapical scarring.  Lungs otherwise clear.  Heart is normal size.  No effusions.  No acute bony abnormality.  IMPRESSION: No active cardiopulmonary disease.  Original Report Authenticated By: Cyndie Chime, M.D.       Patient Active Hospital Problem List: Syncope (04/22/2012)   HTN (hypertension) (04/22/2012)   Diabetes type 2, uncontrolled (04/22/2012)   Symptomatic bradycardia (04/23/2012)    Aortic stenosis-mod  Normal lv function  ? Coronary artery disease with prior stenting-on Plavix    the patient has recurrent falls and has had them for many years. They occur in the context of diabetes and symptoms consistent with orthostatic intolerance and that is the most likely explanation. Bradycardia arrhythmia that was seen might well have been neurally mediated as we are seeing nothing else on the monitor.  We will plan to assess for orthostatic intolerance.  Thereafter when she is discharged we will use an event recorder to try to understand whether there is a underlying arrhythmia contribution to her symptoms. She may well benefit from an implantable loop recorder.  Will be important to try to get records as to her cardiac situation and the need for Plavix.        Signed, Sherryl Manges MD

## 2012-04-23 NOTE — Progress Notes (Signed)
[facility] [place] TEAM 1 - Stepdown/ICU TEAM  Interval history: 78 year-old female with history of diabetes mellitus type 2, previous history of stroke on Plavix, hypertension had some loss of consciousness while she was at home in her yard. Her neighbors called EMS and patient was brought to the ER. Does not know how long she lost consciousness. She had incontinence of urine. In the ER she was complaining of low back pain. CT head and C-spine x-ray of LS-spine and chest x-ray does not show anything acute.   Subjective: Alert and endorses feels much better than she did yesterday although remains weak.  Has some difficulty remembering her past medical history and defers this to her daughter Annabell.  Objective: Blood pressure 125/70, pulse 81, temperature 98 F (36.7 C), temperature source Oral, resp. rate 17, height 5\' 5"  (1.651 m), weight 52.7 kg (116 lb 2.9 oz), SpO2 95.00%.  Intake/Output from previous day: 06/30 0701 - 07/01 0700 In: 700 [I.V.:700] Out: 400 [Urine:400] Intake/Output this shift: Total I/O In: 520 [P.O.:120; I.V.:400] Out: -   General appearance: alert, cooperative, appears stated age and no distress Resp: clear to auscultation bilaterally Cardio: regular rate and rhythm, she does have a very loud 4/6 pansystolic murmur left sternal border second intercostal space, otherwise no click, rub or gallop GI: soft, non-tender; bowel sounds normal; no masses,  no organomegaly Extremities: extremities normal, atraumatic, no cyanosis or edema Neurologic: Grossly normal  Lab Results:  Basename 04/23/12 0350 04/22/12 1825  WBC 10.1 12.5*  HGB 11.4* 13.1  HCT 33.5* 38.6  PLT 270 284   BMET  Basename 04/23/12 0350 04/22/12 2100  NA 138 136  K 3.8 3.9  CL 101 97  CO2 25 26  GLUCOSE 234* 313*  BUN 14 14  CREATININE 0.52 0.56  CALCIUM 8.7 9.0    Studies/Results: Dg Lumbar Spine Complete  04/22/2012  *RADIOLOGY REPORT*  Clinical Data: Fall, low back  pain.  LUMBAR SPINE - COMPLETE 4+ VIEW  Comparison: 10/28/2011  Findings: Moderate leftward scoliosis of the lumbar spine. Associated degenerative disc disease.  Diffuse degenerative facet disease.  Transitional anatomy at the lumbar sacral junction. Moderate compression fracture through L2, slightly progressed since prior study.  No new fracture.  Slight anterolisthesis of L4 on L5, related to facet disease.  SI joints are symmetric and unremarkable.  IMPRESSION: Levoscoliosis.  Advanced degenerative disc and facet disease.  Progressive L2 compression fracture.  No acute fracture.  Original Report Authenticated By: Cyndie Chime, M.D.   Ct Head Wo Contrast  04/22/2012  *RADIOLOGY REPORT*  Clinical Data:  Fall.  Injury.  CT HEAD WITHOUT CONTRAST CT CERVICAL SPINE WITHOUT CONTRAST  Technique:  Multidetector CT imaging of the head and cervical spine was performed following the standard protocol without intravenous contrast.  Multiplanar CT image reconstructions of the cervical spine were also generated.  Comparison:   None  CT HEAD  Findings: Remote left occipital and bilateral remote parietal infarcts noted.  There is a suggestion of small remote frontal vertex infarct and underlying chronic ischemic microvascular white matter disease.  Old infarcts involving the left basal ganglia noted.  The brainstem and cerebellum appear unremarkable.  Confluence of the white matter hypodensity in the occipital parietal region noted.  No hydrocephalus, intracranial hemorrhage, or discrete mass lesion is observed.  Atherosclerotic calcification of the carotid siphons noted.  IMPRESSION:  1.  Multiple remote scattered infarcts, without acute intracranial findings. 2.  White matter confluence of hypodensity in the parietal  lobes is likely due to white matter infarcts, and less likely to be due to other entities such as posterior reversible encephalopathy or other underlying lesion.  CT CERVICAL SPINE  Findings: There is chronic  failure fusion of the posterior arch of C1.  Facet arthropathy is particularly bilaterally at C2-3, C3-4, and C4-5.  Facet arthropathy is also somewhat prominent on the right at T1-2, T2-3, and T3-4.  No cervical spine fracture or acute subluxation is observed.  Loss of intervertebral disc height and posterior osseous ridging noted at C4-5, C5-6, C6-7, and T1-2.  Uncinate and facet spurring cause osseous foraminal stenosis on the left at C4-5 and C5-6, and to a lesser extent at T1-2.  Osseous foraminal narrowing on the right side is present at C3-4, C4-5, C5- 6, C6-7, and T1-2.  There is a small amount of pannus posterior to the odontoid.  IMPRESSION:  1.  Cervical spondylosis with multilevel osseous foraminal stenosis, but without fracture or acute subluxation observed. 2.  Failure fusion of the posterior arch of C1.  Original Report Authenticated By: Dellia Cloud, M.D.   Ct Cervical Spine Wo Contrast  04/22/2012  *RADIOLOGY REPORT*  Clinical Data:  Fall.  Injury.  CT HEAD WITHOUT CONTRAST CT CERVICAL SPINE WITHOUT CONTRAST  Technique:  Multidetector CT imaging of the head and cervical spine was performed following the standard protocol without intravenous contrast.  Multiplanar CT image reconstructions of the cervical spine were also generated.  Comparison:   None  CT HEAD  Findings: Remote left occipital and bilateral remote parietal infarcts noted.  There is a suggestion of small remote frontal vertex infarct and underlying chronic ischemic microvascular white matter disease.  Old infarcts involving the left basal ganglia noted.  The brainstem and cerebellum appear unremarkable.  Confluence of the white matter hypodensity in the occipital parietal region noted.  No hydrocephalus, intracranial hemorrhage, or discrete mass lesion is observed.  Atherosclerotic calcification of the carotid siphons noted.  IMPRESSION:  1.  Multiple remote scattered infarcts, without acute intracranial findings. 2.  White  matter confluence of hypodensity in the parietal lobes is likely due to white matter infarcts, and less likely to be due to other entities such as posterior reversible encephalopathy or other underlying lesion.  CT CERVICAL SPINE  Findings: There is chronic failure fusion of the posterior arch of C1.  Facet arthropathy is particularly bilaterally at C2-3, C3-4, and C4-5.  Facet arthropathy is also somewhat prominent on the right at T1-2, T2-3, and T3-4.  No cervical spine fracture or acute subluxation is observed.  Loss of intervertebral disc height and posterior osseous ridging noted at C4-5, C5-6, C6-7, and T1-2.  Uncinate and facet spurring cause osseous foraminal stenosis on the left at C4-5 and C5-6, and to a lesser extent at T1-2.  Osseous foraminal narrowing on the right side is present at C3-4, C4-5, C5- 6, C6-7, and T1-2.  There is a small amount of pannus posterior to the odontoid.  IMPRESSION:  1.  Cervical spondylosis with multilevel osseous foraminal stenosis, but without fracture or acute subluxation observed. 2.  Failure fusion of the posterior arch of C1.  Original Report Authenticated By: Dellia Cloud, M.D.   Dg Chest Portable 1 View  04/22/2012  *RADIOLOGY REPORT*  Clinical Data: Fall, back pain.  PORTABLE CHEST - 1 VIEW  Comparison: None  Findings: Mild biapical scarring.  Lungs otherwise clear.  Heart is normal size.  No effusions.  No acute bony abnormality.  IMPRESSION: No active  cardiopulmonary disease.  Original Report Authenticated By: Cyndie Chime, M.D.    Medications: I have reviewed the patient's current medications.  Assessment/Plan:  Syncope *Suspect is primarily related to symptomatic bradycardia although patient does have a loud systolic murmur therefore aortic stenosis could also be contributing especially in the setting of a low heart rate *Followup on echocardiogram regarding the evaluation of the murmur *Ambulate with assistance - will need a PT OT  evaluation  Symptomatic bradycardia *Has had no further recurrences since admission to the 2600 unit *Was not on any rate controlling agents prior to admission and her only antihypertensive medication was Norvasc *Appreciate cardiology assistance *Suspect will eventually need pacemaker implantation  Systolic murmur *Clinical exam consistent with aortic stenosis-followup on echocardiogram this admission  HTN (hypertension) *Blood pressure is increasing *Resume home dose of Norvasc  Diabetes type 2, uncontrolled *Was on Glucotrol and Januvia prior to admission-these have been continued *Continue sliding scale insulin  Leukocytosis/Dehydration *Has resolved after hydration *We will go ahead and continue IV fluids for the next 24 hours - watch Is/Os closely in setting of probable AoS  Anterior wedge compression fracture of the L2 vertebral body *Confirmed on MRI- radiologist was unable to completely assess sacral fracture on this exam *Intermittant pain- PT/OT evaluation pending *Need to inform daughter of this finding (I have already spoken to her earlier today)  Elevated cholesterol/Elevated LDL cholesterol level *Was not on medications prior to admission and given her advanced it is unlikely she would benefit from beginning a statin drug  Dementia *Patient prefers communication regarding her health care and decisions regarding her health care to be directed to her daughter Annabell  History of CVA *On Plavix prior to admission  Disposition *Remain in step down due to high risk for recurrent symptomatic bradycardia and possible need for emergency temporary pacemaker placement *Spoke with daughter Wilhemina Cash over the phone and she was updated on the patient's current condition and plans for evaluation of the bradycardia and a heart murmur. She also relates she has been caring for both her parents for several years and plans to speak with the social worker about potential placement  after discharge.   LOS: 1 day   Junious Silk, ANP pager [phone]  [facility] [email or web address] Password: [email or web address]  04/23/2012, 11:37 AM  I have personally examined this patient and reviewed the entire database. I have reviewed the above note, made any necessary editorial changes, and agree with its content.  Lonia Blood, MD [facility]

## 2012-04-23 NOTE — Evaluation (Signed)
Occupational Therapy Evaluation Patient Details Name: Lorraine Tran MRN: [id number] DOB: Apr 09, 1922 Today's Date: 04/23/2012 Time: 4098-1191 OT Time Calculation (min): 25 min  OT Assessment / Plan / Recommendation Clinical Impression  Pt admitted after syncopal episode on her porch.  Neighbors witnessed and called for help per pt.  Pt with apparent decreased cognition, unclear whether this is her baseline.  Pt requiring min assist for mobility and min to mod assist for ADL.  Will need to have 24 hour supervision upon d/c.  If family unable to provide, may need SNF.  Will follow acutely.    OT Assessment  Patient needs continued OT Services    Follow Up Recommendations  Home health OT;Supervision/Assistance - 24 hour;Skilled nursing facility    Barriers to Discharge      Equipment Recommendations  Defer to next venue    Recommendations for Other Services    Frequency  Min 2X/week    Precautions / Restrictions Precautions Precautions: Fall Restrictions Weight Bearing Restrictions: No   Pertinent Vitals/Pain *no pain    ADL  Eating/Feeding: Performed;Set up Where Assessed - Eating/Feeding: Chair Grooming: Simulated;Set up Where Assessed - Grooming: Supported sitting Upper Body Bathing: Simulated;Minimal assistance Where Assessed - Upper Body Bathing: Unsupported sitting Lower Body Bathing: Simulated;Moderate assistance Where Assessed - Lower Body Bathing: Supported sit to stand Upper Body Dressing: Simulated;Minimal assistance Where Assessed - Upper Body Dressing: Unsupported sitting Lower Body Dressing: Simulated;Moderate assistance Where Assessed - Lower Body Dressing: Supported sit to stand;Unsupported sitting Toilet Transfer: Simulated;Minimal assistance Toilet Transfer Method: Sit to stand Transfers/Ambulation Related to ADLs: Ambulated in room with hand held assist.    OT Diagnosis: Generalized weakness;Cognitive deficits;Blindness and low vision  OT Problem  List: Impaired balance (sitting and/or standing);Decreased cognition;Decreased safety awareness;Impaired vision/perception OT Treatment Interventions: Self-care/ADL training;Patient/family education   OT Goals Acute Rehab OT Goals OT Goal Formulation: With patient Time For Goal Achievement: 05/07/12 Potential to Achieve Goals: Good ADL Goals Pt Will Perform Grooming: with supervision;Standing at sink ADL Goal: Grooming - Progress: Goal set today Pt Will Perform Upper Body Bathing: with supervision;Sitting, edge of bed ADL Goal: Upper Body Bathing - Progress: Goal set today Pt Will Perform Lower Body Bathing: with supervision;Sitting, edge of bed;Sit to stand from bed ADL Goal: Lower Body Bathing - Progress: Goal set today Pt Will Perform Upper Body Dressing: with supervision;Sitting, bed ADL Goal: Upper Body Dressing - Progress: Goal set today Pt Will Perform Lower Body Dressing: with supervision;Sitting, bed;Sit to stand from bed ADL Goal: Lower Body Dressing - Progress: Goal set today Pt Will Transfer to Toilet: with supervision;Ambulation;with DME;Regular height toilet ADL Goal: Toilet Transfer - Progress: Goal set today Pt Will Perform Toileting - Clothing Manipulation: with supervision;Standing ADL Goal: Toileting - Clothing Manipulation - Progress: Goal set today Pt Will Perform Toileting - Hygiene: with supervision;Leaning right and/or left on 3-in-1/toilet ADL Goal: Toileting - Hygiene - Progress: Goal set today  Visit Information  Last OT Received On: 04/23/12 Assistance Needed: +1 PT/OT Co-Evaluation/Treatment: Yes    Subjective Data  Subjective: "My husband was in [employer]."   Prior Functioning  Home Living Lives With: Spouse;Other (Comment) (has dementia) Available Help at Discharge: Family;Available PRN/intermittently Type of Home: House Home Access: Level entry Home Layout: Two level;Able to live on main level with bedroom/bathroom Bathroom Shower/Tub: Walk-in  Contractor: Standard Home Adaptive Equipment: Shower chair with back;Hand-held shower hose;Grab bars in shower;Bedside commode/3-in-1;Straight cane Additional Comments: Husband with Alzheimer's  Prior Function Level of Independence: Independent  with assistive device(s) Able to Take Stairs?: No Driving: No Vocation: Retired Comments: PAtient's daughter drives, patient was a Librarian, academic Communication: No difficulties Dominant Hand: Right    Cognition  Overall Cognitive Status: Impaired Area of Impairment: Memory;Awareness of deficits;Problem solving;Awareness of errors Difficult to assess due to:  (? dementia) Arousal/Alertness: Awake/alert Orientation Level: Time Behavior During Session: [facility] for tasks performed Memory Deficits: Poor PLOF historian. Awareness of Errors: Assistance required to identify errors made Cognition - Other Comments: Patient with ? mmemory deficits as well as safety and judgment issues.      Extremity/Trunk Assessment Right Upper Extremity Assessment RUE ROM/Strength/Tone: WFL for tasks assessed Left Upper Extremity Assessment LUE ROM/Strength/Tone: WFL for tasks assessed Right Lower Extremity Assessment RLE ROM/Strength/Tone: WFL for tasks assessed RLE Sensation: WFL - Light Touch RLE Coordination: WFL - gross/fine motor Left Lower Extremity Assessment LLE ROM/Strength/Tone: WFL for tasks assessed LLE Sensation: WFL - Light Touch LLE Coordination: WFL - gross/fine motor Trunk Assessment Trunk Assessment: Normal   Mobility Bed Mobility Bed Mobility: Rolling Left;Left Sidelying to Sit;Sitting - Scoot to [place] of Bed Rolling Left: 4: Min assist Left Sidelying to Sit: 4: Min assist Sitting - Scoot to [place] of Bed: 4: Min assist Details for Bed Mobility Assistance: cues and assist for technique especially for initiation of movement Transfers Sit to Stand: 4: Min assist;With upper extremity assist;From bed;From  elevated surface Stand to Sit: 4: Min assist;With upper extremity assist;To chair/3-in-1;With armrests Details for Transfer Assistance: Patient needed cues for hand placement.  Patient needed cues and assist for technique as well.     Exercise    Balance Balance Balance Assessed: Yes Static Sitting Balance Static Sitting - Balance Support: Bilateral upper extremity supported;Feet supported Static Sitting - Level of Assistance: 6: Modified independent (Device/Increase time) Static Sitting - Comment/# of Minutes: 3  End of Session OT - End of Session Activity Tolerance: Patient tolerated treatment well Patient left: in chair;with call bell/phone within reach  GO     Evern Bio 04/23/2012, 3:26 PM [phone]

## 2012-04-23 NOTE — Progress Notes (Addendum)
Orthostatic VS completed:  Supine: BP=127/48, HR=73   Sitting: BP=130/55, HR=78  Standing: BP=144/64, HR=76  After standing for  2 min: BP=140/52 , HR 76  After standing for 5 min: BP=133/53, HR 78

## 2012-04-23 NOTE — Evaluation (Signed)
Physical Therapy Evaluation Patient Details Name: Lorraine Tran MRN: [id number] DOB: [birth year] Today's Date: 04/23/2012 Time: 4696-2952 PT Time Calculation (min): 31 min  PT Assessment / Plan / Recommendation Clinical Impression  Patient s/p syncope with decreased mobility secondary to decr cognition and decr balance.  Will benefit from PT to address balance and mobility.      PT Assessment  Patient needs continued PT services    Follow Up Recommendations  Home health PT;Supervision/Assistance - 24 hour ((may need SNF if daughter cannot arrange 24 hour care))    Barriers to Discharge Decreased caregiver support Per patient, she and husband live alone.  Will need 24 hour care initially on d/c.    Equipment Recommendations   (4 wheeled walker)    Recommendations for Other Services   None  Frequency Min 3X/week    Precautions / Restrictions Precautions Precautions: Fall Restrictions Weight Bearing Restrictions: No   Pertinent Vitals/Pain VSS, no pain      Mobility  Bed Mobility Bed Mobility: Rolling Left;Left Sidelying to Sit;Sitting - Scoot to [place] of Bed Rolling Left: 4: Min assist Left Sidelying to Sit: 4: Min assist Sitting - Scoot to [place] of Bed: 4: Min assist Details for Bed Mobility Assistance: cues and assist for technique especially for initiation of movement Transfers Transfers: Sit to Stand;Stand to Sit Sit to Stand: 4: Min assist;With upper extremity assist;From bed;From elevated surface Stand to Sit: 4: Min assist;With upper extremity assist;To chair/3-in-1;With armrests Details for Transfer Assistance: Patient needed cues for hand placement.  Patient needed cues and assist for technique as well.   Ambulation/Gait Ambulation/Gait Assistance: 4: Min assist Ambulation Distance (Feet): 35 Feet Assistive device: 2 person hand held assist Ambulation/Gait Assistance Details: Patient with slightly unsteady gait.  Overall did well.   Gait Pattern: Decreased  stride length;Narrow base of support;Shuffle;Step-to pattern;Ataxic Gait velocity: Decreased Stairs: No Wheelchair Mobility Wheelchair Mobility: No         PT Diagnosis: Generalized weakness;Altered mental status  PT Problem List: Decreased activity tolerance;Decreased balance;Decreased mobility;Decreased safety awareness;Decreased knowledge of precautions;Decreased knowledge of use of DME;Decreased cognition PT Treatment Interventions: DME instruction;Gait training;Functional mobility training;Therapeutic activities;Therapeutic exercise;Balance training;Patient/family education;Cognitive remediation   PT Goals Acute Rehab PT Goals PT Goal Formulation: With patient Time For Goal Achievement: 05/07/12 Potential to Achieve Goals: Good Pt will go Supine/Side to Sit: with modified independence PT Goal: Supine/Side to Sit - Progress: Goal set today Pt will go Sit to Stand: with modified independence PT Goal: Sit to Stand - Progress: Goal set today Pt will Transfer Bed to Chair/Chair to Bed: with modified independence PT Transfer Goal: Bed to Chair/Chair to Bed - Progress: Goal set today Pt will Ambulate: >150 feet;with supervision;with least restrictive assistive device PT Goal: Ambulate - Progress: Goal set today  Visit Information  Last PT Received On: 04/23/12 Assistance Needed: +1 PT/OT Co-Evaluation/Treatment: Yes    Subjective Data  Subjective: "Sure as long as you don't rattle my brain."   Prior Functioning  Home Living Lives With: Spouse Available Help at Discharge: Family;Available PRN/intermittently (Daughter assists recently 24 hours) Type of Home: House Home Access: Level entry Home Layout: Two level;Able to live on main level with bedroom/bathroom Bathroom Shower/Tub: Walk-in Contractor: Standard Home Adaptive Equipment: Shower chair with back;Hand-held shower hose;Grab bars in shower;Bedside commode/3-in-1;Straight cane (patient states she is  getting a new RW with a seat) Additional Comments: Husband with Alzheimer's  Prior Function Level of Independence: Independent with assistive device(s) Able to Take Stairs?: No  Driving: No Vocation: Retired Comments: PAtient's daughter drives, patient was a Engineer, manufacturing systems: No difficulties Dominant Hand: Right    Cognition  Overall Cognitive Status: Difficult to assess Difficult to assess due to:  (? dementia) Arousal/Alertness: Awake/alert Orientation Level: Time Behavior During Session: WFL for tasks performed Cognition - Other Comments: Patient with ? mmemory deficits as well as safety and judgment issues.      Extremity/Trunk Assessment Right Lower Extremity Assessment RLE ROM/Strength/Tone: WFL for tasks assessed RLE Sensation: WFL - Light Touch RLE Coordination: WFL - gross/fine motor Left Lower Extremity Assessment LLE ROM/Strength/Tone: WFL for tasks assessed LLE Sensation: WFL - Light Touch LLE Coordination: WFL - gross/fine motor Trunk Assessment Trunk Assessment: Normal   Balance Balance Balance Assessed: Yes Static Sitting Balance Static Sitting - Balance Support: Bilateral upper extremity supported;Feet supported Static Sitting - Level of Assistance: 6: Modified independent (Device/Increase time) Static Sitting - Comment/# of Minutes: 3  End of Session PT - End of Session Equipment Utilized During Treatment: Gait belt Activity Tolerance: Patient tolerated treatment well Patient left: in chair;with call bell/phone within reach Nurse Communication: Mobility status       INGOLD,Remington Highbaugh 04/23/2012, 3:01 PM  [facility] Acute Rehabilitation [phone] (pager)

## 2012-04-24 LAB — CARDIAC PANEL(CRET KIN+CKTOT+MB+TROPI)
CK, MB: 7.7 ng/mL (ref 0.3–4.0)
CK, MB: 6.9 ng/mL (ref 0.3–4.0)
Relative Index: 3.1 — ABNORMAL HIGH (ref 0.0–2.5)
Relative Index: 2.7 — ABNORMAL HIGH (ref 0.0–2.5)
Total CK: 251 U/L — ABNORMAL HIGH (ref 7–177)
Total CK: 259 U/L — ABNORMAL HIGH (ref 7–177)
Troponin I: <0.3 ng/mL (ref <0.30)

## 2012-04-24 LAB — BASIC METABOLIC PANEL
Chloride: 101 mEq/L (ref 96–112)
Creatinine, Ser: 0.59 mg/dL (ref 0.50–1.10)
GFR calc Af Amer: >90 mL/min (ref >90)
GFR calc non Af Amer: 80 mL/min — ABNORMAL LOW (ref >90)
Potassium: 3.6 mEq/L (ref 3.5–5.1)

## 2012-04-24 LAB — CBC
MCHC: 34 g/dL (ref 30.0–36.0)
MCV: 84.3 fL (ref 78.0–100.0)
Platelets: 260 10*3/uL (ref 150–400)
RDW: 13.5 % (ref 11.5–15.5)
WBC: 9.5 10*3/uL (ref 4.0–10.5)

## 2012-04-24 LAB — GLUCOSE, CAPILLARY: Glucose-Capillary: 199 mg/dL — ABNORMAL HIGH (ref 70–99)

## 2012-04-24 LAB — URINE CULTURE: Colony Count: NO GROWTH

## 2012-04-24 NOTE — Progress Notes (Signed)
Pt transferred to 5527 via wheelchair, no IV, no O2. VS stable upon transfer. Family updated about pt transfer. Belongings with patient. Meds in chart. No current questions or complaints. Rayme Bui L

## 2012-04-24 NOTE — Clinical Social Work Psychosocial (Signed)
     Clinical Social Work Department BRIEF PSYCHOSOCIAL ASSESSMENT 04/24/2012  Patient:  Lorraine Tran, Lorraine Tran     Account Number:  [id number]     Admit date:  04/22/2012  Clinical Social Worker:  Margaree Mackintosh  Date/Time:  04/24/2012 04:47 PM  Referred by:  Physician  Date Referred:  04/24/2012 Referred for  Other - See comment  Advanced Directives   Other Referral:   Interview type:  Patient Other interview type:   family-by phone    PSYCHOSOCIAL DATA Living Status:  FAMILY Admitted from facility:   Level of care:   Primary support name:  Tobi Bastos [phone] Primary support relationship to patient:  CHILD, ADULT Degree of support available:   Adequate.    CURRENT CONCERNS Current Concerns  Other - See comment   Other Concerns:   Resources to Assisted Living.    SOCIAL WORK ASSESSMENT / PLAN Clinical Social Worker met with pt at bedside.  Pt pleasantly confused and not oriented to time, place, or event.  CSW provided emotional support.  CSW phoned pt's dtr and provided opportunity for dtr to process feelings. Dtr requested to have HCPOA paperwork signed.  CSW explained that no paperwork could be signed when a pt is not oriented.  Dtr understanding.  Dtr requested information for ALF for August when dtr returns to work. CSW agreed to leave information in chart.  Dtr thanked CSW for intervention.  No other needs identified at this time. CSW to sign off.  Please re consult if needed.   Assessment/plan status:  Information/Referral to [facility] Other assessment/ plan:   Information/referral to community resources:   HCPOA  ALF    PATIENTS/FAMILYS RESPONSE TO PLAN OF CARE: Pt pleasant and engaged in conversation, though not alert and not oriented.  Dtr was pleasant and appropriately engaged.  Dtr thanked CSW for intervention.

## 2012-04-24 NOTE — Progress Notes (Signed)
AM orthostatic VS completed  Supine: BP=143/47  , HR=71  Sitting    BP=141/55,  HR=76   Standing BP=144/91, HR=74  2 min after standing: 137/53, HR=76  5 min after standing:  BP: 129/49, HR 77

## 2012-04-24 NOTE — Progress Notes (Signed)
Occupational Therapy Treatment Patient Details Name: Lorraine Tran MRN: [id number] DOB: 12-23-1921 Today's Date: 04/24/2012 Time: 5621-3086 OT Time Calculation (min): 18 min                        Precautions / Restrictions Precautions Precautions: Fall Restrictions Weight Bearing Restrictions: No       ADL  Grooming: Performed;Brushing hair;Set up Where Assessed - Grooming: Supported standing;Supported sitting Toilet Transfer Method: Sit to stand;Stand Wellsite geologist: Other (comment) (bed to chair) Toileting - Clothing Manipulation and Hygiene: Simulated;Moderate assistance Where Assessed - Toileting Clothing Manipulation and Hygiene: Standing;Other (comment) (leaning posterior)      OT Goals ADL Goals ADL Goal: Grooming - Progress: Progressing toward goals ADL Goal: Toilet Transfer - Progress: Progressing toward goals ADL Goal: Toileting - Clothing Manipulation - Progress: Progressing toward goals ADL Goal: Toileting - Hygiene - Progress: Progressing toward goals  Visit Information  Last OT Received On: 04/24/12          Cognition  Overall Cognitive Status: Impaired Area of Impairment: Memory;Awareness of deficits;Problem solving;Awareness of errors Arousal/Alertness: Awake/alert Orientation Level: Time Behavior During Session: [facility] for tasks performed Memory Deficits: Poor PLOF historian. Awareness of Errors: Assistance required to identify errors made    Mobility Bed Mobility Bed Mobility: Rolling Left;Left Sidelying to Sit;Sitting - Scoot to [place] of Bed Rolling Left: 4: Min assist Left Sidelying to Sit: 4: Min assist Sitting - Scoot to [place] of Bed: 4: Min assist Details for Bed Mobility Assistance: cues and assist for technique especially for initiation of movement Transfers Transfers: Sit to Stand;Stand to Sit Sit to Stand: 4: Min assist;With upper extremity assist;From bed;From elevated surface Stand to Sit: 4: Min assist;With  upper extremity assist;To chair/3-in-1;With armrests Details for Transfer Assistance: Patient needed cues for hand placement.  Patient needed cues and assist for technique as well.           End of Session OT - End of Session Equipment Utilized During Treatment: Gait belt Activity Tolerance: Patient tolerated treatment well Patient left: in chair;with call bell/phone within reach    [facility], Lorraine Tran 04/24/2012, 2:16 PM

## 2012-04-24 NOTE — Progress Notes (Signed)
Report called to Rosanne Ashing, [place] RN.

## 2012-04-24 NOTE — Progress Notes (Signed)
Physical Therapy Treatment Patient Details Name: Lorraine Tran MRN: [id number] DOB: 10-09-1922 Today's Date: 04/24/2012 Time: 0865-7846 PT Time Calculation (min): 23 min  PT Assessment / Plan / Recommendation Comments on Treatment Session  Patient s/p syncope with decr mobility secondary to AMS and decr balance.  Continue PT to address balance and endurance.      Follow Up Recommendations  Home health PT;Supervision/Assistance - 24 hour (may need SNF if daughter cannot arrange 24 hour care)    Barriers to Discharge  None      Equipment Recommendations  Defer to next venue    Recommendations for Other Services  None  Frequency Min 3X/week   Plan Discharge plan remains appropriate;Frequency remains appropriate    Precautions / Restrictions Precautions Precautions: Fall Restrictions Weight Bearing Restrictions: No   Pertinent Vitals/Pain VSS/ No pain    Mobility  Bed Mobility Bed Mobility: Not assessed Rolling Left: 4: Min assist Left Sidelying to Sit: 4: Min assist Sitting - Scoot to Edge of Bed: 4: Min assist Details for Bed Mobility Assistance: cues and assist for technique especially for initiation of movement Transfers Transfers: Sit to Stand;Stand to Sit Sit to Stand: 3: Mod assist;With upper extremity assist;With armrests;From chair/3-in-1 Stand to Sit: 4: Min assist;With upper extremity assist;With armrests;To chair/3-in-1 Details for Transfer Assistance: Patient needed cues for hand placement.   Ambulation/Gait Ambulation/Gait Assistance: 4: Min assist Ambulation Distance (Feet): 65 Feet Assistive device: Rolling walker Ambulation/Gait Assistance Details: Unsteady gait needing min assist to steady patient.   Gait Pattern: Decreased stride length;Narrow base of support;Shuffle;Step-to pattern;Ataxic Gait velocity: Decreased Stairs: No Wheelchair Mobility Wheelchair Mobility: No      PT Goals Acute Rehab PT Goals PT Goal: Sit to Stand - Progress:  Progressing toward goal PT Transfer Goal: Bed to Chair/Chair to Bed - Progress: Progressing toward goal PT Goal: Ambulate - Progress: Progressing toward goal  Visit Information  Last PT Received On: 04/24/12 Assistance Needed: +1    Subjective Data  Subjective: "I would like to walk."   Cognition  Overall Cognitive Status: Impaired Area of Impairment: Memory;Awareness of deficits;Problem solving;Awareness of errors;Safety/judgement Arousal/Alertness: Awake/alert Orientation Level: Disoriented to;Time Behavior During Session: WFL for tasks performed Memory Deficits: Poor PLOF historian. Safety/Judgement: Decreased awareness of safety precautions;Decreased safety judgement for tasks assessed;Decreased awareness of need for assistance Awareness of Errors: Assistance required to identify errors made    Balance  Static Sitting Balance Static Sitting - Level of Assistance: Not tested (comment)  End of Session PT - End of Session Equipment Utilized During Treatment: Gait belt Activity Tolerance: Patient tolerated treatment well Patient left: in chair;with call bell/phone within reach Nurse Communication: Mobility status       INGOLD,Cadell Gabrielson 04/24/2012, 4:16 PM  [facility] [phone] (pager)

## 2012-04-24 NOTE — Progress Notes (Signed)
Received patient on the floor from 2600,pt awake ,alert and oriented to person,place disoriented to time,pupils sluggish,speech is clear,able to follow command,palpable radial and pedal pulses x 4,no edema,capillary refill less than 3 secs,97% sao2 at roomair,lungs clear,Bowel sound x 4 in all 4 quadrant,continent x 2 ,bowel and bladder,no skin issue,no complaint of pain.Pleasant and cooperative.

## 2012-04-24 NOTE — Progress Notes (Addendum)
[facility] [place] TEAM 1 - Stepdown/ICU TEAM  Interval history: 78 year-old female with history of diabetes mellitus type 2, previous history of stroke on Plavix, hypertension had some loss of consciousness while she was at home in her yard. Her neighbors called EMS and patient was brought to the ER. Does not know how long she lost consciousness. She had incontinence of urine. In the ER she was complaining of low back pain. CT head and C-spine x-ray of LS-spine and chest x-ray does not show anything acute.   Subjective: Alert today and very animated. Does recall visits by the other physicians yesterday but could not recall their names. Still exhibiting signs of short-term memory deficit. Complains of sacral pain.  Objective: Blood pressure 146/48, pulse 70, temperature 98.1 F (36.7 C), temperature source Oral, resp. rate 16, height 5\' 5"  (1.651 m), weight 53.3 kg (117 lb 8.1 oz), SpO2 97.00%.  Intake/Output from previous day: 07/01 0701 - 07/02 0700 In: 937.5 [P.O.:360; I.V.:577.5] Out: 2675 [Urine:2675] Intake/Output this shift: Total I/O In: 200 [P.O.:200] Out: 400 [Urine:400]  General appearance: alert, cooperative, appears stated age and no distress Resp: clear to auscultation bilaterally Cardio: regular rate and rhythm, she does have a very loud 4/6 pansystolic murmur left sternal border second intercostal space, otherwise no click, rub or gallop GI: soft, non-tender; bowel sounds normal; no masses,  no organomegaly Extremities: extremities normal, atraumatic, no cyanosis or edema Neurologic: Grossly normal  Lab Results:  Basename 04/24/12 0051 04/23/12 0350  WBC 9.5 10.1  HGB 11.3* 11.4*  HCT 33.2* 33.5*  PLT 260 270   BMET  Basename 04/24/12 0051 04/23/12 0350  NA 138 138  K 3.6 3.8  CL 101 101  CO2 27 25  GLUCOSE 208* 234*  BUN 13 14  CREATININE 0.59 0.52  CALCIUM 8.6 8.7    Studies/Results: Dg Lumbar Spine Complete  04/22/2012  *RADIOLOGY  REPORT*  Clinical Data: Fall, low back pain.  LUMBAR SPINE - COMPLETE 4+ VIEW  Comparison: 10/28/2011  Findings: Moderate leftward scoliosis of the lumbar spine. Associated degenerative disc disease.  Diffuse degenerative facet disease.  Transitional anatomy at the lumbar sacral junction. Moderate compression fracture through L2, slightly progressed since prior study.  No new fracture.  Slight anterolisthesis of L4 on L5, related to facet disease.  SI joints are symmetric and unremarkable.  IMPRESSION: Levoscoliosis.  Advanced degenerative disc and facet disease.  Progressive L2 compression fracture.  No acute fracture.  Original Report Authenticated By: Cyndie Chime, M.D.   Ct Head Wo Contrast  04/22/2012  *RADIOLOGY REPORT*  Clinical Data:  Fall.  Injury.  CT HEAD WITHOUT CONTRAST CT CERVICAL SPINE WITHOUT CONTRAST  Technique:  Multidetector CT imaging of the head and cervical spine was performed following the standard protocol without intravenous contrast.  Multiplanar CT image reconstructions of the cervical spine were also generated.  Comparison:   None  CT HEAD  Findings: Remote left occipital and bilateral remote parietal infarcts noted.  There is a suggestion of small remote frontal vertex infarct and underlying chronic ischemic microvascular white matter disease.  Old infarcts involving the left basal ganglia noted.  The brainstem and cerebellum appear unremarkable.  Confluence of the white matter hypodensity in the occipital parietal region noted.  No hydrocephalus, intracranial hemorrhage, or discrete mass lesion is observed.  Atherosclerotic calcification of the carotid siphons noted.  IMPRESSION:  1.  Multiple remote scattered infarcts, without acute intracranial findings. 2.  White matter confluence of hypodensity in the  parietal lobes is likely due to white matter infarcts, and less likely to be due to other entities such as posterior reversible encephalopathy or other underlying lesion.  CT  CERVICAL SPINE  Findings: There is chronic failure fusion of the posterior arch of C1.  Facet arthropathy is particularly bilaterally at C2-3, C3-4, and C4-5.  Facet arthropathy is also somewhat prominent on the right at T1-2, T2-3, and T3-4.  No cervical spine fracture or acute subluxation is observed.  Loss of intervertebral disc height and posterior osseous ridging noted at C4-5, C5-6, C6-7, and T1-2.  Uncinate and facet spurring cause osseous foraminal stenosis on the left at C4-5 and C5-6, and to a lesser extent at T1-2.  Osseous foraminal narrowing on the right side is present at C3-4, C4-5, C5- 6, C6-7, and T1-2.  There is a small amount of pannus posterior to the odontoid.  IMPRESSION:  1.  Cervical spondylosis with multilevel osseous foraminal stenosis, but without fracture or acute subluxation observed. 2.  Failure fusion of the posterior arch of C1.  Original Report Authenticated By: Dellia Cloud, M.D.   Ct Cervical Spine Wo Contrast  04/22/2012  *RADIOLOGY REPORT*  Clinical Data:  Fall.  Injury.  CT HEAD WITHOUT CONTRAST CT CERVICAL SPINE WITHOUT CONTRAST  Technique:  Multidetector CT imaging of the head and cervical spine was performed following the standard protocol without intravenous contrast.  Multiplanar CT image reconstructions of the cervical spine were also generated.  Comparison:   None  CT HEAD  Findings: Remote left occipital and bilateral remote parietal infarcts noted.  There is a suggestion of small remote frontal vertex infarct and underlying chronic ischemic microvascular white matter disease.  Old infarcts involving the left basal ganglia noted.  The brainstem and cerebellum appear unremarkable.  Confluence of the white matter hypodensity in the occipital parietal region noted.  No hydrocephalus, intracranial hemorrhage, or discrete mass lesion is observed.  Atherosclerotic calcification of the carotid siphons noted.  IMPRESSION:  1.  Multiple remote scattered infarcts,  without acute intracranial findings. 2.  White matter confluence of hypodensity in the parietal lobes is likely due to white matter infarcts, and less likely to be due to other entities such as posterior reversible encephalopathy or other underlying lesion.  CT CERVICAL SPINE  Findings: There is chronic failure fusion of the posterior arch of C1.  Facet arthropathy is particularly bilaterally at C2-3, C3-4, and C4-5.  Facet arthropathy is also somewhat prominent on the right at T1-2, T2-3, and T3-4.  No cervical spine fracture or acute subluxation is observed.  Loss of intervertebral disc height and posterior osseous ridging noted at C4-5, C5-6, C6-7, and T1-2.  Uncinate and facet spurring cause osseous foraminal stenosis on the left at C4-5 and C5-6, and to a lesser extent at T1-2.  Osseous foraminal narrowing on the right side is present at C3-4, C4-5, C5- 6, C6-7, and T1-2.  There is a small amount of pannus posterior to the odontoid.  IMPRESSION:  1.  Cervical spondylosis with multilevel osseous foraminal stenosis, but without fracture or acute subluxation observed. 2.  Failure fusion of the posterior arch of C1.  Original Report Authenticated By: Dellia Cloud, M.D.   Mr Brain Wo Contrast  04/23/2012  *RADIOLOGY REPORT*  Clinical Data: 78year old hypertensive diabetic with episode of loss of consciousness.  MRI HEAD WITHOUT CONTRAST  Technique:  Multiplanar, multiecho pulse sequences of the brain and surrounding structures were obtained according to standard protocol without intravenous contrast.  Comparison: 04/22/2012 CT.  No comparison MR.  Findings: No acute infarct.  Remote infarcts with encephalomalacia involving the right parietal - occipital lobe, left occipital lobe and medial left temporal lobe.  Smaller remote infarcts involving the basal ganglia and corona radiata.  Moderate small vessel disease type changes.  Global atrophy without hydrocephalus.  No intracranial hemorrhage.  No  intracranial mass lesion detected on this unenhanced exam.  Cervical spondylotic changes with spinal stenosis C4-5 and C5-6.  Major intracranial vascular structures are patent.  IMPRESSION: No acute infarct.  Remote infarcts, small vessel disease type changes and atrophy as noted above.  Original Report Authenticated By: Fuller Canada, M.D.   Mr Lumbar Spine Wo Contrast  04/23/2012  *RADIOLOGY REPORT*  Clinical Data: Loss of consciousness.  Back pain.  MRI LUMBAR SPINE WITHOUT CONTRAST  Technique:  Multiplanar and multiecho pulse sequences of the lumbar spine were obtained without intravenous contrast.  Comparison: 04/22/2012 and 10/28/2011 plain film examination of the lumbar spine.  No comparison MR.  Findings: Transitional vertebra has been labeled S1 on prior plain film examination.  Utilizing this level assignment, on the present examination incorporates from the T10-11 disc space through the lower sacrum.  Conus L1-2 level.  Fracture of the sacrum incompletely assessed on the present exam.  Anterior wedge compression fracture of the L2 vertebral body involving superior endplate with 60% loss of height anteriorly. Retropulsion of the posterior-superior aspect of the compressed vertebra combined with broad-based disc protrusion and facet joint degenerative changes is causing L1-2 mild to slightly moderate spinal stenosis and mild bilateral foraminal narrowing. Compression fracture of L2 is noted on the 10/28/2011 plain film examination but has progressed (edema is seen throughout the vertebral body as well small amount of edema within the adjacent L1 inferior endplate).  Widening of the facet joints.  T10-11:  Negative.  T11-12:  Minimal Schmorl's node deformity.  T12-L1:  Very small Schmorl's node deformity.  L1-2:  As above.  L2-3:  Facet joint degenerative changes.  Ligamentum flavum hypertrophy.  Mild bulge.  Very mild spinal stenosis and foraminal narrowing.  L3-4:  Facet joint degenerative changes and  ligamentum flavum hypertrophy greater on the right.  Prominent disc degeneration with broad-based disc osteophyte right lateral position approaching but not compressing the exiting right L3 nerve root.  Mild spinal stenosis greater on the right.  L4-5:  Prominent bilateral facet joint degenerative changes. Ligamentum flavum hypertrophy.  Minimal anterior slip L4. Bulge/osteophyte greater to the right with mild impression upon the exiting right L4 nerve root.  Multifactorial severe spinal stenosis.  L5-S1:  Prominent facet joint degenerative changes and ligamentum flavum hypertrophy greater on the left.  Moderate bulge/broad-based protrusion osteophyte greater to the left and centrally.  Mild encroaching upon the exiting left L5 nerve root.  Marked left-sided and moderate right-sided lateral recess stenosis.  Moderate spinal stenosis greater on the left.  S1-S2:  Pars defects.  Bony overgrowth.  Minimal anterior slip S1. Bulge/small protrusion with slight cephalad extension.  Lateral osteophyte greater to the right encroaching upon the exiting nerve root.  IMPRESSION:  Transitional vertebra has been labeled S1 on prior plain film examination.  Fracture of the sacrum incompletely assessed on the present exam.  Anterior wedge compression fracture of the L2 vertebral body involving superior endplate with 60% loss of height anteriorly. Retropulsion of the posterior-superior aspect of the compressed vertebra combined with broad-based disc protrusion and facet joint degenerative changes is causing L1-2 mild to slightly moderate spinal stenosis and  mild bilateral foraminal narrowing. Compression fracture of L2 is noted on the 10/28/2011 plain film examination but has progressed.  L3-4 broad-based disc osteophyte right lateral position approaching but not compressing the exiting right L3 nerve root.  Mild spinal stenosis greater on the right.  L4-5 bulge/osteophyte greater to the right with mild impression upon the exiting  right L4 nerve root.  Multifactorial severe spinal stenosis.  L5-S1 moderate bulge/broad-based protrusion osteophyte greater to the left and centrally.  Mild encroaching upon the exiting left L5 nerve root.  Marked left-sided and moderate right-sided lateral recess stenosis.  Moderate spinal stenosis greater on the left.  S1-S2 prior defects.  Bony overgrowth.  Minimal anterior slip S1. Bulge/small protrusion with slight cephalad extension.  Lateral osteophyte greater to the right encroaching upon the exiting nerve root.  Please see above further detail.  This has been made a PRA call report utilizing dashboard call feature.  Original Report Authenticated By: Fuller Canada, M.D.   Dg Chest Portable 1 View  04/22/2012  *RADIOLOGY REPORT*  Clinical Data: Fall, back pain.  PORTABLE CHEST - 1 VIEW  Comparison: None  Findings: Mild biapical scarring.  Lungs otherwise clear.  Heart is normal size.  No effusions.  No acute bony abnormality.  IMPRESSION: No active cardiopulmonary disease.  Original Report Authenticated By: Cyndie Chime, M.D.    Medications: I have reviewed the patient's current medications.  Assessment/Plan:  Syncope *Appreciate cardiology assistance-they noted a long-standing history of orthostatic light headedness and presyncope. *In addition this patient has had recurrent falls for many years and cardiology feels at this point that the patient likely has orthostatic intolerance therefore they are planning to continue to assess for orthostatic intolerance during hospitalization. *Ambulate with assistance - will need a PT OT evaluation  Symptomatic bradycardia *Has had no further recurrences since admission to the 2600 unit *Was not on any rate controlling agents prior to admission and her only antihypertensive medication was Norvasc *Appreciate cardiology assistance-they suspect that the bradycardia arrhythmia may have been neurally mediated. *At time of discharge she will likely  need an event monitor-considerations also being given to possible placement of an implantable loop recorder  Systolic murmur *Echocardiogram this admission demonstrates moderate aortic stenosis and mild mitral stenosis  HTN (hypertension) *Better controlled-continue Norvasc  Diabetes type 2, uncontrolled *Was on Glucotrol and Januvia prior to admission-these have been continued *Continue sliding scale insulin  Leukocytosis/Dehydration *Has resolved after hydration *Saline lock IV fluid  Anterior wedge compression fracture of the L2 vertebral body/sacral fracture with pain *On examination there is no bruising and only a slight bulge of the right superior aspect of the sacrum which is mildly tender. This area remains fixed without evidence movable bone on palpation *Confirmed on MRI- radiologist was unable to completely assess sacral fracture on this exam *Intermittant pain- PT/OT evaluation pending  Elevated cholesterol/Elevated LDL cholesterol level *Was not on medications prior to admission and given her advanced it is unlikely she would benefit from beginning a statin drug  Dementia *Patient prefers communication regarding her health care and decisions regarding her health care to be directed to her daughter Annabell *This diagnosis discussed in detail with the patient's daughter at her request clarity  History of CVA *On Plavix prior to admission  CODE STATUS: *Clarified during my discussion with the patient's daughter Annabell today. DO NOT RESUSCITATE status requested with continuation of current treatments but would like to treat acute changes in condition as appropriate.  Disposition *Transfer to telemetry *Spoke with  daughter Wilhemina Cash over the phone and she was updated on condition and plans to transfer to the telemetry unit. She also relates she has been caring for both her parents for several years and plans to speak with the social worker about potential placement  after discharge.   LOS: 2 days   Junious Silk, ANP pager [phone]  [facility] [email or web address] Password: [email or web address]  04/24/2012, 11:35 AM   I have examined the patient and reviewed the chart and agree with the above note.  Calvert Cantor, MD [phone]

## 2012-04-24 NOTE — Progress Notes (Signed)
Pt stated "I feel like I'm having a stroke." After a thorough neuro assessment (no changes from this morning), patient was asked what she meant by that. She said I just miss my husband. No facial drooping or asymmetry, no pronator drift, speech was clear, pupils were 3mm, round and brisk. MD Rizwan was called with no new orders. Was told to continue to monitor.  Lorraine Tran L

## 2012-04-25 DIAGNOSIS — E7800 Pure hypercholesterolemia, unspecified: Secondary | ICD-10-CM

## 2012-04-25 DIAGNOSIS — I10 Essential (primary) hypertension: Secondary | ICD-10-CM

## 2012-04-25 LAB — GLUCOSE, CAPILLARY
Glucose-Capillary: 233 mg/dL — ABNORMAL HIGH (ref 70–99)
Glucose-Capillary: 266 mg/dL — ABNORMAL HIGH (ref 70–99)
Glucose-Capillary: 226 mg/dL — ABNORMAL HIGH (ref 70–99)
Glucose-Capillary: 173 mg/dL — ABNORMAL HIGH (ref 70–99)

## 2012-04-25 LAB — HEMOGLOBIN A1C: Hgb A1c MFr Bld: 8.7 % — ABNORMAL HIGH (ref <5.7)

## 2012-04-25 MED ORDER — ENOXAPARIN SODIUM 40 MG/0.4ML ~~LOC~~ SOLN
40.0000 mg | SUBCUTANEOUS | Status: DC
  Administered 2012-04-25 – 2012-04-26 (×2): 40 mg via SUBCUTANEOUS
  Filled 2012-04-25 (×3): qty 0.4

## 2012-04-25 MED ORDER — ACETAMINOPHEN 325 MG PO TABS
650.0000 mg | ORAL_TABLET | Freq: Four times a day (QID) | ORAL | Status: DC | PRN
  Administered 2012-04-25 – 2012-04-26 (×2): 650 mg via ORAL
  Filled 2012-04-25: qty 2

## 2012-04-25 NOTE — Progress Notes (Signed)
Inpatient Diabetes Program Recommendations  AACE/ADA: New Consensus Statement on Inpatient Glycemic Control (2009)  Target Ranges:  Prepandial:   less than 140 mg/dL      Peak postprandial:   less than 180 mg/dL (1-2 hours)      Critically ill patients:  140 - 180 mg/dL   Reason for Visit: Fasting hyperglycemia  Inpatient Diabetes Program Recommendations Insulin - Basal: Would benefit from addition of Lantus 10 units at HS  Note: Thank you, Lenor Coffin, RN, CNS, Diabetes Coordinator [phone])

## 2012-04-25 NOTE — Progress Notes (Signed)
  Patient Name: Lorraine Tran      SUBJECTIVE: extreeemly sleepy and confused  Past Medical History  Diagnosis Date  . Diabetes mellitus   . Dementia   . Stroke     PHYSICAL EXAM Filed Vitals:   04/24/12 1845 04/24/12 2129 04/25/12 0615 04/25/12 0627  BP: 116/56 137/56 175/76 158/72  Pulse: 72 69 87   Temp: 97.2 F (36.2 C) 98 F (36.7 C) 98.3 F (36.8 C)   TempSrc: Oral Oral Oral   Resp: 20 20 20    Height: 5\' 4"  (1.626 m)     Weight: 124 lb 1.9 oz (56.3 kg)     SpO2: 97% 95% 96%     Well developed and nourished in no acute distress HENT normal Neck supple with JVP-flat Clear Regular rate and rhythm, no murmurs or gallops Abd-soft with active BS No Clubbing cyanosis edema Skin-warm and dry A    Grossly normal sensory and motor function   TELEMETRY: Reviewed telemetry pt in  NSR    Intake/Output Summary (Last 24 hours) at 04/25/12 0801 Last data filed at 04/24/12 1400  Gross per 24 hour  Intake    200 ml  Output    500 ml  Net   -300 ml    LABS: Basic Metabolic Panel:  Lab 04/24/12 5621 04/23/12 0350 04/22/12 2100 04/22/12 1825  NA 138 138 136 134*  K 3.6 3.8 3.9 3.9  CL 101 101 97 93*  CO2 27 25 26 26   GLUCOSE 208* 234* 313* 297*  BUN 13 14 14 14   CREATININE 0.59 0.52 0.56 0.52  CALCIUM 8.6 8.7 -- --  MG -- -- -- --  PHOS -- -- -- --   Cardiac Enzymes:  Basename 04/24/12 1639 04/24/12 0936 04/24/12 0051  CKTOTAL 259* 275* 251*  CKMB 6.9* 7.8* 7.7*  CKMBINDEX -- -- --  TROPONINI <0.30 <0.30 <0.30   CBC:  Lab 04/24/12 0051 04/23/12 0350 04/22/12 1825  WBC 9.5 10.1 12.5*  NEUTROABS -- -- 11.1*  HGB 11.3* 11.4* 13.1  HCT 33.2* 33.5* 38.6  MCV 84.3 84.2 84.3  PLT 260 270 284   PROTIME:  Basename 04/22/12 1825  LABPROT 13.2  INR 0.98   Liver Function Tests:  Basename 04/23/12 0350 04/22/12 2100  AST 11 13  ALT 7 9  ALKPHOS 59 64  BILITOT 0.5 0.4  PROT 5.7* 6.2  ALBUMIN 3.1* 3.4*   No results found for this basename:  LIPASE:2,AMYLASE:2 in the last 72 hours BNP: No components found with this basename: POCBNP:3 D-Dimer: No results found for this basename: DDIMER:2 in the last 72 hours Hemoglobin A1C:  Basename 04/22/12 2335  HGBA1C 8.6*   Fasting Lipid Panel:  Basename 04/23/12 0350  CHOL 228*  HDL 51  LDLCALC 153*  TRIG 120  CHOLHDL 4.5  LDLDIRECT --   Thyroid Function Tests: No results found for this basename: TSH,T4TOTAL,FREET3,T3FREE,THYROIDAB in the last 72 hours Anemia Panel: No results found for this basename: VITAMINB12,FOLATE,FERRITIN,TIBC,IRON,RETICCTPCT in the last 72 hours   Device Interrogation:    ASSESSMENT AND PLAN:  Patient Active Hospital Problem List: Syncope (04/22/2012)   HTN (hypertension) (04/22/2012)     Symptomatic bradycardia (04/23/2012)    Systolic murmur (3/0/8657)    No new recs  Consider event recorder at dischzarge  Signed, Sherryl Manges MD  04/25/2012

## 2012-04-25 NOTE — Care Management Note (Unsigned)
    Page 1 of 2   04/25/2012     4:47:21 PM   CARE MANAGEMENT NOTE 04/25/2012  Patient:  Lorraine Tran, Lorraine Tran   Account Number:  [id number]  Date Initiated:  04/23/2012  Documentation initiated by:  Alvira Philips Assessment:   78 yr-old female adm with dx of syncope; lives with spouse, has shower chair, cane and rolling walker, h/o home health services through [facility].     Action/Plan:   pt eval- rec snf vs 24 hr care   Anticipated DC Date:  04/27/2012   Anticipated DC Plan:  SKILLED NURSING FACILITY  In-house referral  Clinical Social Worker      DC Planning Services  CM consult      Choice offered to / List presented to:             Status of service:  In process, will continue to follow Medicare Important Message given?   (If response is "NO", the following Medicare IM given date fields will be blank) Date Medicare IM given:   Date Additional Medicare IM given:    Discharge Disposition:    Per UR Regulation:  Reviewed for med. necessity/level of care/duration of stay  If discussed at Long Length of Stay Meetings, dates discussed:    Comments:  PCP:  Dr. Darrow Bussing  ContactLilian Kapur, daughter [phone]  04/25/12 16:43 Letha Cape RN, BSN [phone] spoke with daughter Annabell, she states she would like for patient to go to st snf because she can not provide 24 hr care, she is trying to take care of her father now, informed Dahlia Client CSW of this information.  Informed daughter that CSW will contact her regarding snf's.  NCM will continue to follow for dc needs.  04/23/12 1100 Henrietta Mayo RN MSN CCM Per daughter, pt is primary caregiver with 72 yr-old spouse who has advanced dementia.  Daughter works in the school system so is out of work over the summer but will return to work in the fall.  Daughter has been providing dinner meal plus housekeeping and shopping for pt and states she needs to make other arrangements - requests information on  services available.  Discussed intermittent skilled services available through Medicare, options for assisted living.  CSW will provide more information about ALF options.  Await PT/OT evals.

## 2012-04-25 NOTE — Discharge Summary (Signed)
PATIENT DETAILS Name: Lorraine Tran Age: 78 y.o. Sex: female Date of Birth: [birth year] Admit Date: 04/22/2012 WUJ:[email or web address], MD  Subjective: Seems awake and alert-complaining of some low back pain  Objective: Vital signs in last 24 hours: Filed Vitals:   04/24/12 2129 04/25/12 0615 04/25/12 0627 04/25/12 1006  BP: 137/56 175/76 158/72 125/65  Pulse: 69 87  72  Temp: 98 F (36.7 C) 98.3 F (36.8 C)    TempSrc: Oral Oral    Resp: 20 20  18   Height:      Weight:      SpO2: 95% 96%  95%    Weight change: 3 kg (6 lb 9.8 oz)  Body mass index is 21.30 kg/(m^2).  Intake/Output from previous day:  Intake/Output Summary (Last 24 hours) at 04/25/12 1057 Last data filed at 04/25/12 0918  Gross per 24 hour  Intake    120 ml  Output    200 ml  Net    -80 ml    PHYSICAL EXAM: Gen Exam: Awake and alert with clear speech.  Neck: Supple, No JVD.   Chest: B/L Clear.   CVS: S1 S2 Regular, no murmurs.  Abdomen: soft, BS +, non tender, non distended.  Extremities: no edema, lower extremities warm to touch. Neurologic: Non Focal.   Skin: No Rash.   Wounds: N/A.    CONSULTS:  cardiology  LAB RESULTS: CBC  Lab 04/24/12 0051 04/23/12 0350 04/22/12 1825  WBC 9.5 10.1 12.5*  HGB 11.3* 11.4* 13.1  HCT 33.2* 33.5* 38.6  PLT 260 270 284  MCV 84.3 84.2 84.3  MCH 28.7 28.6 28.6  MCHC 34.0 34.0 33.9  RDW 13.5 13.6 13.6  LYMPHSABS -- -- 0.9  MONOABS -- -- 0.5  EOSABS -- -- 0.0  BASOSABS -- -- 0.0  BANDABS -- -- --    Chemistries   Lab 04/24/12 0051 04/23/12 0350 04/22/12 2100 04/22/12 1825  NA 138 138 136 134*  K 3.6 3.8 3.9 3.9  CL 101 101 97 93*  CO2 27 25 26 26   GLUCOSE 208* 234* 313* 297*  BUN 13 14 14 14   CREATININE 0.59 0.52 0.56 0.52  CALCIUM 8.6 8.7 9.0 9.5  MG -- -- -- --    CBG:  Lab 04/25/12 0743 04/24/12 2158 04/24/12 1608 04/24/12 1207 04/24/12 0804  GLUCAP 233* 153* 294* 275* 199*    GFR Estimated Creatinine Clearance: 42.8 ml/min  (by C-G formula based on Cr of 0.59).  Coagulation profile  Lab 04/22/12 1825  INR 0.98  PROTIME --    Cardiac Enzymes  Lab 04/24/12 1639 04/24/12 0936 04/24/12 0051  CKMB 6.9* 7.8* 7.7*  TROPONINI <0.30 <0.30 <0.30  MYOGLOBIN -- -- --    No components found with this basename: POCBNP:3 No results found for this basename: DDIMER:2 in the last 72 hours  Basename 04/23/12 0350 04/22/12 2335  HGBA1C 8.7* 8.6*    Basename 04/23/12 0350  CHOL 228*  HDL 51  LDLCALC 153*  TRIG 120  CHOLHDL 4.5  LDLDIRECT --   No results found for this basename: TSH,T4TOTAL,FREET3,T3FREE,THYROIDAB in the last 72 hours No results found for this basename: VITAMINB12:2,FOLATE:2,FERRITIN:2,TIBC:2,IRON:2,RETICCTPCT:2 in the last 72 hours No results found for this basename: LIPASE:2,AMYLASE:2 in the last 72 hours  Urine Studies No results found for this basename: UACOL:2,UAPR:2,USPG:2,UPH:2,UTP:2,UGL:2,UKET:2,UBIL:2,UHGB:2,UNIT:2,UROB:2,ULEU:2,UEPI:2,UWBC:2,URBC:2,UBAC:2,CAST:2,CRYS:2,UCOM:2,BILUA:2 in the last 72 hours  MICROBIOLOGY: Recent Results (from the past 240 hour(s))  MRSA PCR SCREENING     Status: Normal   Collection Time   04/22/12  9:45 PM      Component Value Range Status Comment   MRSA by PCR NEGATIVE  NEGATIVE Final   URINE CULTURE     Status: Normal   Collection Time   04/23/12 12:20 AM      Component Value Range Status Comment   Specimen Description URINE, CATHETERIZED   Final    Special Requests NONE   Final    Culture  Setup Time 04/23/2012 00:55   Final    Colony Count NO GROWTH   Final    Culture NO GROWTH   Final    Report Status 04/24/2012 FINAL   Final     RADIOLOGY STUDIES/RESULTS: Dg Lumbar Spine Complete  04/22/2012  *RADIOLOGY REPORT*  Clinical Data: Fall, low back pain.  LUMBAR SPINE - COMPLETE 4+ VIEW  Comparison: 10/28/2011  Findings: Moderate leftward scoliosis of the lumbar spine. Associated degenerative disc disease.  Diffuse degenerative facet disease.   Transitional anatomy at the lumbar sacral junction. Moderate compression fracture through L2, slightly progressed since prior study.  No new fracture.  Slight anterolisthesis of L4 on L5, related to facet disease.  SI joints are symmetric and unremarkable.  IMPRESSION: Levoscoliosis.  Advanced degenerative disc and facet disease.  Progressive L2 compression fracture.  No acute fracture.  Original Report Authenticated By: Cyndie Chime, M.D.   Ct Head Wo Contrast  04/22/2012  *RADIOLOGY REPORT*  Clinical Data:  Fall.  Injury.  CT HEAD WITHOUT CONTRAST CT CERVICAL SPINE WITHOUT CONTRAST  Technique:  Multidetector CT imaging of the head and cervical spine was performed following the standard protocol without intravenous contrast.  Multiplanar CT image reconstructions of the cervical spine were also generated.  Comparison:   None  CT HEAD  Findings: Remote left occipital and bilateral remote parietal infarcts noted.  There is a suggestion of small remote frontal vertex infarct and underlying chronic ischemic microvascular white matter disease.  Old infarcts involving the left basal ganglia noted.  The brainstem and cerebellum appear unremarkable.  Confluence of the white matter hypodensity in the occipital parietal region noted.  No hydrocephalus, intracranial hemorrhage, or discrete mass lesion is observed.  Atherosclerotic calcification of the carotid siphons noted.  IMPRESSION:  1.  Multiple remote scattered infarcts, without acute intracranial findings. 2.  White matter confluence of hypodensity in the parietal lobes is likely due to white matter infarcts, and less likely to be due to other entities such as posterior reversible encephalopathy or other underlying lesion.  CT CERVICAL SPINE  Findings: There is chronic failure fusion of the posterior arch of C1.  Facet arthropathy is particularly bilaterally at C2-3, C3-4, and C4-5.  Facet arthropathy is also somewhat prominent on the right at T1-2, T2-3, and T3-4.   No cervical spine fracture or acute subluxation is observed.  Loss of intervertebral disc height and posterior osseous ridging noted at C4-5, C5-6, C6-7, and T1-2.  Uncinate and facet spurring cause osseous foraminal stenosis on the left at C4-5 and C5-6, and to a lesser extent at T1-2.  Osseous foraminal narrowing on the right side is present at C3-4, C4-5, C5- 6, C6-7, and T1-2.  There is a small amount of pannus posterior to the odontoid.  IMPRESSION:  1.  Cervical spondylosis with multilevel osseous foraminal stenosis, but without fracture or acute subluxation observed. 2.  Failure fusion of the posterior arch of C1.  Original Report Authenticated By: Dellia Cloud, M.D.   Ct Cervical Spine Wo Contrast  04/22/2012  *RADIOLOGY REPORT*  Clinical  Data:  Fall.  Injury.  CT HEAD WITHOUT CONTRAST CT CERVICAL SPINE WITHOUT CONTRAST  Technique:  Multidetector CT imaging of the head and cervical spine was performed following the standard protocol without intravenous contrast.  Multiplanar CT image reconstructions of the cervical spine were also generated.  Comparison:   None  CT HEAD  Findings: Remote left occipital and bilateral remote parietal infarcts noted.  There is a suggestion of small remote frontal vertex infarct and underlying chronic ischemic microvascular white matter disease.  Old infarcts involving the left basal ganglia noted.  The brainstem and cerebellum appear unremarkable.  Confluence of the white matter hypodensity in the occipital parietal region noted.  No hydrocephalus, intracranial hemorrhage, or discrete mass lesion is observed.  Atherosclerotic calcification of the carotid siphons noted.  IMPRESSION:  1.  Multiple remote scattered infarcts, without acute intracranial findings. 2.  White matter confluence of hypodensity in the parietal lobes is likely due to white matter infarcts, and less likely to be due to other entities such as posterior reversible encephalopathy or other  underlying lesion.  CT CERVICAL SPINE  Findings: There is chronic failure fusion of the posterior arch of C1.  Facet arthropathy is particularly bilaterally at C2-3, C3-4, and C4-5.  Facet arthropathy is also somewhat prominent on the right at T1-2, T2-3, and T3-4.  No cervical spine fracture or acute subluxation is observed.  Loss of intervertebral disc height and posterior osseous ridging noted at C4-5, C5-6, C6-7, and T1-2.  Uncinate and facet spurring cause osseous foraminal stenosis on the left at C4-5 and C5-6, and to a lesser extent at T1-2.  Osseous foraminal narrowing on the right side is present at C3-4, C4-5, C5- 6, C6-7, and T1-2.  There is a small amount of pannus posterior to the odontoid.  IMPRESSION:  1.  Cervical spondylosis with multilevel osseous foraminal stenosis, but without fracture or acute subluxation observed. 2.  Failure fusion of the posterior arch of C1.  Original Report Authenticated By: Dellia Cloud, M.D.   Mr Brain Wo Contrast  04/23/2012  *RADIOLOGY REPORT*  Clinical Data: 78year old hypertensive diabetic with episode of loss of consciousness.  MRI HEAD WITHOUT CONTRAST  Technique:  Multiplanar, multiecho pulse sequences of the brain and surrounding structures were obtained according to standard protocol without intravenous contrast.  Comparison: 04/22/2012 CT.  No comparison MR.  Findings: No acute infarct.  Remote infarcts with encephalomalacia involving the right parietal - occipital lobe, left occipital lobe and medial left temporal lobe.  Smaller remote infarcts involving the basal ganglia and corona radiata.  Moderate small vessel disease type changes.  Global atrophy without hydrocephalus.  No intracranial hemorrhage.  No intracranial mass lesion detected on this unenhanced exam.  Cervical spondylotic changes with spinal stenosis C4-5 and C5-6.  Major intracranial vascular structures are patent.  IMPRESSION: No acute infarct.  Remote infarcts, small vessel disease  type changes and atrophy as noted above.  Original Report Authenticated By: Fuller Canada, M.D.   Mr Lumbar Spine Wo Contrast  04/23/2012  *RADIOLOGY REPORT*  Clinical Data: Loss of consciousness.  Back pain.  MRI LUMBAR SPINE WITHOUT CONTRAST  Technique:  Multiplanar and multiecho pulse sequences of the lumbar spine were obtained without intravenous contrast.  Comparison: 04/22/2012 and 10/28/2011 plain film examination of the lumbar spine.  No comparison MR.  Findings: Transitional vertebra has been labeled S1 on prior plain film examination.  Utilizing this level assignment, on the present examination incorporates from the T10-11 disc space through  the lower sacrum.  Conus L1-2 level.  Fracture of the sacrum incompletely assessed on the present exam.  Anterior wedge compression fracture of the L2 vertebral body involving superior endplate with 60% loss of height anteriorly. Retropulsion of the posterior-superior aspect of the compressed vertebra combined with broad-based disc protrusion and facet joint degenerative changes is causing L1-2 mild to slightly moderate spinal stenosis and mild bilateral foraminal narrowing. Compression fracture of L2 is noted on the 10/28/2011 plain film examination but has progressed (edema is seen throughout the vertebral body as well small amount of edema within the adjacent L1 inferior endplate).  Widening of the facet joints.  T10-11:  Negative.  T11-12:  Minimal Schmorl's node deformity.  T12-L1:  Very small Schmorl's node deformity.  L1-2:  As above.  L2-3:  Facet joint degenerative changes.  Ligamentum flavum hypertrophy.  Mild bulge.  Very mild spinal stenosis and foraminal narrowing.  L3-4:  Facet joint degenerative changes and ligamentum flavum hypertrophy greater on the right.  Prominent disc degeneration with broad-based disc osteophyte right lateral position approaching but not compressing the exiting right L3 nerve root.  Mild spinal stenosis greater on the right.   L4-5:  Prominent bilateral facet joint degenerative changes. Ligamentum flavum hypertrophy.  Minimal anterior slip L4. Bulge/osteophyte greater to the right with mild impression upon the exiting right L4 nerve root.  Multifactorial severe spinal stenosis.  L5-S1:  Prominent facet joint degenerative changes and ligamentum flavum hypertrophy greater on the left.  Moderate bulge/broad-based protrusion osteophyte greater to the left and centrally.  Mild encroaching upon the exiting left L5 nerve root.  Marked left-sided and moderate right-sided lateral recess stenosis.  Moderate spinal stenosis greater on the left.  S1-S2:  Pars defects.  Bony overgrowth.  Minimal anterior slip S1. Bulge/small protrusion with slight cephalad extension.  Lateral osteophyte greater to the right encroaching upon the exiting nerve root.  IMPRESSION:  Transitional vertebra has been labeled S1 on prior plain film examination.  Fracture of the sacrum incompletely assessed on the present exam.  Anterior wedge compression fracture of the L2 vertebral body involving superior endplate with 60% loss of height anteriorly. Retropulsion of the posterior-superior aspect of the compressed vertebra combined with broad-based disc protrusion and facet joint degenerative changes is causing L1-2 mild to slightly moderate spinal stenosis and mild bilateral foraminal narrowing. Compression fracture of L2 is noted on the 10/28/2011 plain film examination but has progressed.  L3-4 broad-based disc osteophyte right lateral position approaching but not compressing the exiting right L3 nerve root.  Mild spinal stenosis greater on the right.  L4-5 bulge/osteophyte greater to the right with mild impression upon the exiting right L4 nerve root.  Multifactorial severe spinal stenosis.  L5-S1 moderate bulge/broad-based protrusion osteophyte greater to the left and centrally.  Mild encroaching upon the exiting left L5 nerve root.  Marked left-sided and moderate  right-sided lateral recess stenosis.  Moderate spinal stenosis greater on the left.  S1-S2 prior defects.  Bony overgrowth.  Minimal anterior slip S1. Bulge/small protrusion with slight cephalad extension.  Lateral osteophyte greater to the right encroaching upon the exiting nerve root.  Please see above further detail.  This has been made a PRA call report utilizing dashboard call feature.  Original Report Authenticated By: Fuller Canada, M.D.   Dg Chest Portable 1 View  04/22/2012  *RADIOLOGY REPORT*  Clinical Data: Fall, back pain.  PORTABLE CHEST - 1 VIEW  Comparison: None  Findings: Mild biapical scarring.  Lungs otherwise clear.  Heart is normal size.  No effusions.  No acute bony abnormality.  IMPRESSION: No active cardiopulmonary disease.  Original Report Authenticated By: Cyndie Chime, M.D.    MEDICATIONS: Scheduled Meds:   . amLODipine  5 mg Oral Daily  . atorvastatin  10 mg Oral q1800  . clopidogrel  75 mg Oral QAC breakfast  . glipiZIDE  10 mg Oral BID AC  . insulin aspart  0-9 Units Subcutaneous TID WC  . linagliptin  5 mg Oral Daily   Continuous Infusions:  PRN Meds:.senna-docusate  Antibiotics: Anti-infectives    None      Assessment/Plan: Principal Problem:  *Syncope -Seen by EP-Dr. Graciela Husbands -Sensory not thought to be related to orthostatic intolerance issues -Current plans are To continue to monitor in telemetry, potential implantation of a loop recorder on discharge -Bradycardia on admission-thought to be nearly mediated as not seen on monitor -MRI brain on 04/23/2012-negative for acute infarct, old CVA seen -Cardiac enzymes negative  Bradycardia -This was seen on admission -No recurrence since then -Current suspicion for it to be neurally mediated  Aortic Stenosis -per cards note-moderate AS -will obtain official results of Echo -Will need outpatient surveillance monitoring  Hypertension -Controlled with Norvasc  Diabetes -A1c-8.7 -Continue with  glipizide and tradjenta -Add low-dose Lantus while inpatient  Leukocytosis -This was on admission, now resolved -Afebrile-UA negative for UTI, chest x-ray negative for pneumonia -Leukocytosis on admission was likely stress response  Dehydration -Resolved with IV fluids -Early euvolemic  Anterior wedge compression fracture of the L2 vertebral body/sacral fracture with pain  *On examination there is no bruising and only a slight bulge of the right superior aspect of the sacrum which is mildly tender. This area remains fixed without evidence movable bone on palpation  *Confirmed on MRI- radiologist was unable to completely assess sacral fracture on this exam  *Intermittant pain- PT/OT evaluation pending -As needed Tylenol  Dyslipidemia -Continue with statin  History of CVA -Continue Plavix  History of dementia -Seems to be at baseline this morning  Disposition: -Skilled nursing facility   DVT Prophylaxis: Prophylactic lovenox  Code Status: DO NOT RESUSCITATE  Jeoffrey Massed, MD  [facility] Pager [phone]  If 7PM-7AM, please contact night-coverage [email or web address] Password [email or web address] 04/25/2012, 10:57 AM   LOS: 3 days

## 2012-04-26 LAB — GLUCOSE, CAPILLARY
Glucose-Capillary: 159 mg/dL — ABNORMAL HIGH (ref 70–99)
Glucose-Capillary: 285 mg/dL — ABNORMAL HIGH (ref 70–99)
Glucose-Capillary: 224 mg/dL — ABNORMAL HIGH (ref 70–99)

## 2012-04-26 NOTE — Progress Notes (Signed)
Physical Therapy Treatment Patient Details Name: SHANTEE HAYNE MRN: [id number] DOB: 06-Dec-1921 Today's Date: 04/26/2012 Time: 7253-6644 PT Time Calculation (min): 10 min  PT Assessment / Plan / Recommendation Comments on Treatment Session  Pt's mobility limited today by pain in right knee.  No obvious defects noted on inspection.  Continues to be appropriate for rehab at [facility].  If pain in knee persists, will need MD to address.    Follow Up Recommendations  Skilled nursing facility    Barriers to Discharge        Equipment Recommendations  Defer to next venue    Recommendations for Other Services    Frequency Min 3X/week   Plan Frequency remains appropriate;Discharge plan needs to be updated    Precautions / Restrictions Precautions Precautions: Fall Restrictions Weight Bearing Restrictions: No   Pertinent Vitals/Pain Pain in right knee with weight bearing, not rated.  RN made aware.    Mobility  Bed Mobility Bed Mobility: Not assessed Transfers Transfers: Sit to Stand;Stand to Sit Sit to Stand: 3: Mod assist;With upper extremity assist;With armrests;From chair/3-in-1 Stand to Sit: 4: Min assist;With upper extremity assist;With armrests;To chair/3-in-1 Details for Transfer Assistance: safety cues and technique Ambulation/Gait Ambulation/Gait Assistance: 4: Min assist Ambulation Distance (Feet): 4 Feet Assistive device: Rolling walker Ambulation/Gait Assistance Details: Pt. had pain in right knee with gait today, limiting her tolerance to walking to 4 steps.  Was painfree once she sat down in recliner. Gait Pattern: Decreased stride length;Antalgic;Trunk flexed Gait velocity: Decreased Stairs: No    Exercises     PT Diagnosis:    PT Problem List:   PT Treatment Interventions:     PT Goals Acute Rehab PT Goals PT Goal: Ambulate - Progress: Other (comment) (limited today by pain in right knee)  Visit Information  Last PT Received On: 04/26/12 Assistance  Needed: +1    Subjective Data  Subjective: Wants to walk   Cognition  Overall Cognitive Status: Impaired Area of Impairment: Memory;Awareness of deficits;Problem solving;Awareness of errors;Safety/judgement Arousal/Alertness: Awake/alert Orientation Level: Disoriented to;Time Behavior During Session: WFL for tasks performed Safety/Judgement: Decreased awareness of safety precautions;Decreased safety judgement for tasks assessed;Decreased awareness of need for assistance Awareness of Errors: Assistance required to identify errors made    Balance     End of Session PT - End of Session Equipment Utilized During Treatment: Gait belt Activity Tolerance: Patient limited by pain Patient left: in chair;with call bell/phone within reach Nurse Communication: Mobility status;Other (comment) (pain in right knee when attempting ambulation)        Ferman Hamming 04/26/2012, 12:45 PM Acute Rehabilitation Services [phone] (pager)

## 2012-04-26 NOTE — Progress Notes (Signed)
Clinical Social Work  CSW met with patient in order to discuss dc plans. Patient reports that she lives with her husband and that her dtr assists them. Patient reports being in pain and reports that she is not able to ambulate as well as she used to. Patient reports that she is agreeable to ST SNF in order to get back to her baseline. CSW provided patient with a list and explained SNF process. Patient reports she has never been to SNF and does not have a preference at this time. CSW received permission to contact dtr. CSW spoke with dtr via phone who reported that she is currently taking care of patient's husband and cannot care for patient at dc. Dtr is agreeable to ST SNF and reports that she has been researching ALF for patient and husband for a long-term plan. CSW explained process to dtr and she is agreeable to plan as well. CSW will follow up with patient and dtr with bed offers.  [place], [place] [phone]

## 2012-04-26 NOTE — Plan of Care (Signed)
Problem: Phase II Progression Outcomes Goal: Tolerates increased mobility Outcome: Completed/Met Date Met:  04/26/12 oob to chair with 1 assist

## 2012-04-26 NOTE — Progress Notes (Addendum)
Clinical Social Work Department CLINICAL SOCIAL WORK PLACEMENT NOTE 04/26/2012  Patient:  Lorraine Tran, Lorraine Tran  Account Number:  [id number] Admit date:  04/22/2012  Clinical Social Worker:  Unk Lightning, LCSW  Date/time:  04/26/2012 11:00 AM  Clinical Social Work is seeking post-discharge placement for this patient at the following level of care:   SKILLED NURSING   (*CSW will update this form in Epic as items are completed)   04/26/2012  Patient/family provided with [facility] Department of Clinical Social Work's list of facilities offering this level of care within the geographic area requested by the patient (or if unable, by the patient's family).  04/26/2012  Patient/family informed of their freedom to choose among providers that offer the needed level of care, that participate in Medicare, Medicaid or managed care program needed by the patient, have an available bed and are willing to accept the patient.  04/26/2012  Patient/family informed of [facility]' ownership interest in [facility], as well as of the fact that they are under no obligation to receive care at this facility.  PASARR submitted to EDS on 04/26/2012 PASARR number received from EDS on 04/26/2012  FL2 transmitted to all facilities in geographic area requested by pt/family on  04/26/2012 FL2 transmitted to all facilities within larger geographic area on   Patient informed that his/her managed care company has contracts with or will negotiate with  certain facilities, including the following:     Patient/family informed of bed offers received:  04/26/2012 Patient chooses bed at   [facility] Physician recommends and patient chooses bed at   SNF  Patient to be transferred to  on  04/27/2012 Patient to be transferred to facility by EMS   The following physician request were entered in Epic:   Additional Comments: Ashley Jacobs, MSW LCSW [phone]

## 2012-04-26 NOTE — Discharge Summary (Signed)
PATIENT DETAILS Name: Lorraine Tran Age: 78 y.o. Sex: female Date of Birth: 11/21/1921 Admit Date: 04/22/2012 ZOX:[email or web address],[email or web address], MD  Subjective: No major events overnight  Objective: Vital signs in last 24 hours: Filed Vitals:   04/25/12 1006 04/25/12 1300 04/25/12 2026 04/26/12 0535  BP: 125/65 137/43 124/57 150/55  Pulse: 72 97 75 76  Temp:  98.7 F (37.1 C) 98.4 F (36.9 C) 98.4 F (36.9 C)  TempSrc:  Oral Oral Tympanic  Resp: 18 18 16 16   Height:      Weight:      SpO2: 95% 97% 95% 92%    Weight change:   Body mass index is 21.30 kg/(m^2).  Intake/Output from previous day:  Intake/Output Summary (Last 24 hours) at 04/26/12 1026 Last data filed at 04/26/12 0830  Gross per 24 hour  Intake    643 ml  Output      0 ml  Net    643 ml    PHYSICAL EXAM: Gen Exam: Awake and alert with clear speech.  Neck: Supple, No JVD.   Chest: B/L Clear.  No rales CVS: S1 S2 Regular, no murmurs.  Abdomen: soft, BS +, non tender, non distended.  Extremities: no edema, lower extremities warm to touch. Neurologic: Non Focal.   Skin: No Rash.   Wounds: N/A.    CONSULTS:  cardiology  LAB RESULTS: CBC  Lab 04/24/12 0051 04/23/12 0350 04/22/12 1825  WBC 9.5 10.1 12.5*  HGB 11.3* 11.4* 13.1  HCT 33.2* 33.5* 38.6  PLT 260 270 284  MCV 84.3 84.2 84.3  MCH 28.7 28.6 28.6  MCHC 34.0 34.0 33.9  RDW 13.5 13.6 13.6  LYMPHSABS -- -- 0.9  MONOABS -- -- 0.5  EOSABS -- -- 0.0  BASOSABS -- -- 0.0  BANDABS -- -- --    Chemistries   Lab 04/24/12 0051 04/23/12 0350 04/22/12 2100 04/22/12 1825  NA 138 138 136 134*  K 3.6 3.8 3.9 3.9  CL 101 101 97 93*  CO2 27 25 26 26   GLUCOSE 208* 234* 313* 297*  BUN 13 14 14 14   CREATININE 0.59 0.52 0.56 0.52  CALCIUM 8.6 8.7 9.0 9.5  MG -- -- -- --    CBG:  Lab 04/26/12 0756 04/25/12 2146 04/25/12 1635 04/25/12 1152 04/25/12 0743  GLUCAP 159* 173* 226* 266* 233*    GFR Estimated Creatinine Clearance: 42.8 ml/min (by C-G  formula based on Cr of 0.59).  Coagulation profile  Lab 04/22/12 1825  INR 0.98  PROTIME --    Cardiac Enzymes  Lab 04/24/12 1639 04/24/12 0936 04/24/12 0051  CKMB 6.9* 7.8* 7.7*  TROPONINI <0.30 <0.30 <0.30  MYOGLOBIN -- -- --    No components found with this basename: POCBNP:3 No results found for this basename: DDIMER:2 in the last 72 hours No results found for this basename: HGBA1C:2 in the last 72 hours No results found for this basename: CHOL:2,HDL:2,LDLCALC:2,TRIG:2,CHOLHDL:2,LDLDIRECT:2 in the last 72 hours No results found for this basename: TSH,T4TOTAL,FREET3,T3FREE,THYROIDAB in the last 72 hours No results found for this basename: VITAMINB12:2,FOLATE:2,FERRITIN:2,TIBC:2,IRON:2,RETICCTPCT:2 in the last 72 hours No results found for this basename: LIPASE:2,AMYLASE:2 in the last 72 hours  Urine Studies No results found for this basename: UACOL:2,UAPR:2,USPG:2,UPH:2,UTP:2,UGL:2,UKET:2,UBIL:2,UHGB:2,UNIT:2,UROB:2,ULEU:2,UEPI:2,UWBC:2,URBC:2,UBAC:2,CAST:2,CRYS:2,UCOM:2,BILUA:2 in the last 72 hours  MICROBIOLOGY: Recent Results (from the past 240 hour(s))  MRSA PCR SCREENING     Status: Normal   Collection Time   04/22/12  9:45 PM      Component Value Range Status Comment   MRSA by  PCR NEGATIVE  NEGATIVE Final   URINE CULTURE     Status: Normal   Collection Time   04/23/12 12:20 AM      Component Value Range Status Comment   Specimen Description URINE, CATHETERIZED   Final    Special Requests NONE   Final    Culture  Setup Time 04/23/2012 00:55   Final    Colony Count NO GROWTH   Final    Culture NO GROWTH   Final    Report Status 04/24/2012 FINAL   Final     RADIOLOGY STUDIES/RESULTS: Dg Lumbar Spine Complete  04/22/2012  *RADIOLOGY REPORT*  Clinical Data: Fall, low back pain.  LUMBAR SPINE - COMPLETE 4+ VIEW  Comparison: 10/28/2011  Findings: Moderate leftward scoliosis of the lumbar spine. Associated degenerative disc disease.  Diffuse degenerative facet disease.   Transitional anatomy at the lumbar sacral junction. Moderate compression fracture through L2, slightly progressed since prior study.  No new fracture.  Slight anterolisthesis of L4 on L5, related to facet disease.  SI joints are symmetric and unremarkable.  IMPRESSION: Levoscoliosis.  Advanced degenerative disc and facet disease.  Progressive L2 compression fracture.  No acute fracture.  Original Report Authenticated By: Cyndie Chime, M.D.   Ct Head Wo Contrast  04/22/2012  *RADIOLOGY REPORT*  Clinical Data:  Fall.  Injury.  CT HEAD WITHOUT CONTRAST CT CERVICAL SPINE WITHOUT CONTRAST  Technique:  Multidetector CT imaging of the head and cervical spine was performed following the standard protocol without intravenous contrast.  Multiplanar CT image reconstructions of the cervical spine were also generated.  Comparison:   None  CT HEAD  Findings: Remote left occipital and bilateral remote parietal infarcts noted.  There is a suggestion of small remote frontal vertex infarct and underlying chronic ischemic microvascular white matter disease.  Old infarcts involving the left basal ganglia noted.  The brainstem and cerebellum appear unremarkable.  Confluence of the white matter hypodensity in the occipital parietal region noted.  No hydrocephalus, intracranial hemorrhage, or discrete mass lesion is observed.  Atherosclerotic calcification of the carotid siphons noted.  IMPRESSION:  1.  Multiple remote scattered infarcts, without acute intracranial findings. 2.  White matter confluence of hypodensity in the parietal lobes is likely due to white matter infarcts, and less likely to be due to other entities such as posterior reversible encephalopathy or other underlying lesion.  CT CERVICAL SPINE  Findings: There is chronic failure fusion of the posterior arch of C1.  Facet arthropathy is particularly bilaterally at C2-3, C3-4, and C4-5.  Facet arthropathy is also somewhat prominent on the right at T1-2, T2-3, and T3-4.   No cervical spine fracture or acute subluxation is observed.  Loss of intervertebral disc height and posterior osseous ridging noted at C4-5, C5-6, C6-7, and T1-2.  Uncinate and facet spurring cause osseous foraminal stenosis on the left at C4-5 and C5-6, and to a lesser extent at T1-2.  Osseous foraminal narrowing on the right side is present at C3-4, C4-5, C5- 6, C6-7, and T1-2.  There is a small amount of pannus posterior to the odontoid.  IMPRESSION:  1.  Cervical spondylosis with multilevel osseous foraminal stenosis, but without fracture or acute subluxation observed. 2.  Failure fusion of the posterior arch of C1.  Original Report Authenticated By: Dellia Cloud, M.D.   Ct Cervical Spine Wo Contrast  04/22/2012  *RADIOLOGY REPORT*  Clinical Data:  Fall.  Injury.  CT HEAD WITHOUT CONTRAST CT CERVICAL SPINE WITHOUT CONTRAST  Technique:  Multidetector CT imaging of the head and cervical spine was performed following the standard protocol without intravenous contrast.  Multiplanar CT image reconstructions of the cervical spine were also generated.  Comparison:   None  CT HEAD  Findings: Remote left occipital and bilateral remote parietal infarcts noted.  There is a suggestion of small remote frontal vertex infarct and underlying chronic ischemic microvascular white matter disease.  Old infarcts involving the left basal ganglia noted.  The brainstem and cerebellum appear unremarkable.  Confluence of the white matter hypodensity in the occipital parietal region noted.  No hydrocephalus, intracranial hemorrhage, or discrete mass lesion is observed.  Atherosclerotic calcification of the carotid siphons noted.  IMPRESSION:  1.  Multiple remote scattered infarcts, without acute intracranial findings. 2.  White matter confluence of hypodensity in the parietal lobes is likely due to white matter infarcts, and less likely to be due to other entities such as posterior reversible encephalopathy or other  underlying lesion.  CT CERVICAL SPINE  Findings: There is chronic failure fusion of the posterior arch of C1.  Facet arthropathy is particularly bilaterally at C2-3, C3-4, and C4-5.  Facet arthropathy is also somewhat prominent on the right at T1-2, T2-3, and T3-4.  No cervical spine fracture or acute subluxation is observed.  Loss of intervertebral disc height and posterior osseous ridging noted at C4-5, C5-6, C6-7, and T1-2.  Uncinate and facet spurring cause osseous foraminal stenosis on the left at C4-5 and C5-6, and to a lesser extent at T1-2.  Osseous foraminal narrowing on the right side is present at C3-4, C4-5, C5- 6, C6-7, and T1-2.  There is a small amount of pannus posterior to the odontoid.  IMPRESSION:  1.  Cervical spondylosis with multilevel osseous foraminal stenosis, but without fracture or acute subluxation observed. 2.  Failure fusion of the posterior arch of C1.  Original Report Authenticated By: Dellia Cloud, M.D.   Mr Brain Wo Contrast  04/23/2012  *RADIOLOGY REPORT*  Clinical Data: 78year old hypertensive diabetic with episode of loss of consciousness.  MRI HEAD WITHOUT CONTRAST  Technique:  Multiplanar, multiecho pulse sequences of the brain and surrounding structures were obtained according to standard protocol without intravenous contrast.  Comparison: 04/22/2012 CT.  No comparison MR.  Findings: No acute infarct.  Remote infarcts with encephalomalacia involving the right parietal - occipital lobe, left occipital lobe and medial left temporal lobe.  Smaller remote infarcts involving the basal ganglia and corona radiata.  Moderate small vessel disease type changes.  Global atrophy without hydrocephalus.  No intracranial hemorrhage.  No intracranial mass lesion detected on this unenhanced exam.  Cervical spondylotic changes with spinal stenosis C4-5 and C5-6.  Major intracranial vascular structures are patent.  IMPRESSION: No acute infarct.  Remote infarcts, small vessel disease  type changes and atrophy as noted above.  Original Report Authenticated By: Fuller Canada, M.D.   Mr Lumbar Spine Wo Contrast  04/23/2012  *RADIOLOGY REPORT*  Clinical Data: Loss of consciousness.  Back pain.  MRI LUMBAR SPINE WITHOUT CONTRAST  Technique:  Multiplanar and multiecho pulse sequences of the lumbar spine were obtained without intravenous contrast.  Comparison: 04/22/2012 and 10/28/2011 plain film examination of the lumbar spine.  No comparison MR.  Findings: Transitional vertebra has been labeled S1 on prior plain film examination.  Utilizing this level assignment, on the present examination incorporates from the T10-11 disc space through the lower sacrum.  Conus L1-2 level.  Fracture of the sacrum incompletely assessed on the  present exam.  Anterior wedge compression fracture of the L2 vertebral body involving superior endplate with 60% loss of height anteriorly. Retropulsion of the posterior-superior aspect of the compressed vertebra combined with broad-based disc protrusion and facet joint degenerative changes is causing L1-2 mild to slightly moderate spinal stenosis and mild bilateral foraminal narrowing. Compression fracture of L2 is noted on the 10/28/2011 plain film examination but has progressed (edema is seen throughout the vertebral body as well small amount of edema within the adjacent L1 inferior endplate).  Widening of the facet joints.  T10-11:  Negative.  T11-12:  Minimal Schmorl's node deformity.  T12-L1:  Very small Schmorl's node deformity.  L1-2:  As above.  L2-3:  Facet joint degenerative changes.  Ligamentum flavum hypertrophy.  Mild bulge.  Very mild spinal stenosis and foraminal narrowing.  L3-4:  Facet joint degenerative changes and ligamentum flavum hypertrophy greater on the right.  Prominent disc degeneration with broad-based disc osteophyte right lateral position approaching but not compressing the exiting right L3 nerve root.  Mild spinal stenosis greater on the right.   L4-5:  Prominent bilateral facet joint degenerative changes. Ligamentum flavum hypertrophy.  Minimal anterior slip L4. Bulge/osteophyte greater to the right with mild impression upon the exiting right L4 nerve root.  Multifactorial severe spinal stenosis.  L5-S1:  Prominent facet joint degenerative changes and ligamentum flavum hypertrophy greater on the left.  Moderate bulge/broad-based protrusion osteophyte greater to the left and centrally.  Mild encroaching upon the exiting left L5 nerve root.  Marked left-sided and moderate right-sided lateral recess stenosis.  Moderate spinal stenosis greater on the left.  S1-S2:  Pars defects.  Bony overgrowth.  Minimal anterior slip S1. Bulge/small protrusion with slight cephalad extension.  Lateral osteophyte greater to the right encroaching upon the exiting nerve root.  IMPRESSION:  Transitional vertebra has been labeled S1 on prior plain film examination.  Fracture of the sacrum incompletely assessed on the present exam.  Anterior wedge compression fracture of the L2 vertebral body involving superior endplate with 60% loss of height anteriorly. Retropulsion of the posterior-superior aspect of the compressed vertebra combined with broad-based disc protrusion and facet joint degenerative changes is causing L1-2 mild to slightly moderate spinal stenosis and mild bilateral foraminal narrowing. Compression fracture of L2 is noted on the 10/28/2011 plain film examination but has progressed.  L3-4 broad-based disc osteophyte right lateral position approaching but not compressing the exiting right L3 nerve root.  Mild spinal stenosis greater on the right.  L4-5 bulge/osteophyte greater to the right with mild impression upon the exiting right L4 nerve root.  Multifactorial severe spinal stenosis.  L5-S1 moderate bulge/broad-based protrusion osteophyte greater to the left and centrally.  Mild encroaching upon the exiting left L5 nerve root.  Marked left-sided and moderate  right-sided lateral recess stenosis.  Moderate spinal stenosis greater on the left.  S1-S2 prior defects.  Bony overgrowth.  Minimal anterior slip S1. Bulge/small protrusion with slight cephalad extension.  Lateral osteophyte greater to the right encroaching upon the exiting nerve root.  Please see above further detail.  This has been made a PRA call report utilizing dashboard call feature.  Original Report Authenticated By: Fuller Canada, M.D.   Dg Chest Portable 1 View  04/22/2012  *RADIOLOGY REPORT*  Clinical Data: Fall, back pain.  PORTABLE CHEST - 1 VIEW  Comparison: None  Findings: Mild biapical scarring.  Lungs otherwise clear.  Heart is normal size.  No effusions.  No acute bony abnormality.  IMPRESSION: No active  cardiopulmonary disease.  Original Report Authenticated By: Cyndie Chime, M.D.    MEDICATIONS: Scheduled Meds:    . amLODipine  5 mg Oral Daily  . atorvastatin  10 mg Oral q1800  . clopidogrel  75 mg Oral QAC breakfast  . enoxaparin (LOVENOX) injection  40 mg Subcutaneous Q24H  . glipiZIDE  10 mg Oral BID AC  . insulin aspart  0-9 Units Subcutaneous TID WC  . linagliptin  5 mg Oral Daily   Continuous Infusions:  PRN Meds:.acetaminophen, senna-docusate  Antibiotics: Anti-infectives    None      Assessment/Plan: Principal Problem:  *Syncope -Seen by EP-Dr. Graciela Husbands -Syncope hought to be related to orthostatic intolerance issues -Current plans are to continue to monitor in telemetry, potential implantation of a loop recorder on discharge -Bradycardia on admission-thought to be neurally mediated  -MRI brain on 04/23/2012-negative for acute infarct, old CVA seen -Cardiac enzymes negative  Bradycardia -This was seen on admission--No recurrence since then-Current suspicion for it to be neurally mediated  Aortic Stenosis -per cards note-moderate AS -will obtain official results of Echo -Will need outpatient surveillance monitoring  Hypertension -Controlled with  Norvasc  Diabetes -A1c-8.7 -Continue with glipizide and tradjenta -Add low-dose Lantus while inpatient  Leukocytosis -This was on admission, now resolved -Afebrile-UA/Urine culture negative for UTI, chest x-ray negative for pneumonia -Leukocytosis on admission was likely stress response  Dehydration -Resolved with IV fluids -Currently euvolemic  Anterior wedge compression fracture of the L2 vertebral body/sacral fracture with pain  *On examination there is no bruising and only a slight bulge of the right superior aspect of the sacrum which is mildly tender. This area remains fixed without evidence movable bone on palpation  *Confirmed on MRI- radiologist was unable to completely assess sacral fracture on this exam  *Intermittant pain- PT/OT evaluation done-recommendations for SNF-family agreeable -As needed Tylenol  Dyslipidemia -Continue with statin  History of CVA -Continue Plavix  History of dementia -Seems to be at baseline this morning  Disposition: -Skilled nursing facility   DVT Prophylaxis: Prophylactic lovenox  Code Status: DO NOT RESUSCITATE  Jeoffrey Massed, MD  [facility] Pager [phone]  If 7PM-7AM, please contact night-coverage [email or web address] Password [email or web address] 04/26/2012, 10:26 AM   LOS: 4 days

## 2012-04-27 LAB — GLUCOSE, CAPILLARY: Glucose-Capillary: 216 mg/dL — ABNORMAL HIGH (ref 70–99)

## 2012-04-27 MED ORDER — ACETAMINOPHEN 325 MG PO TABS: 650.0000 mg | ORAL_TABLET | Freq: Four times a day (QID) | ORAL | Status: AC | PRN

## 2012-04-27 MED ORDER — TRAMADOL HCL 50 MG PO TABS: 50.0000 mg | ORAL_TABLET | Freq: Three times a day (TID) | ORAL | Status: AC | PRN

## 2012-04-27 MED ORDER — ATORVASTATIN CALCIUM 10 MG PO TABS: 10.0000 mg | ORAL_TABLET | Freq: Every day | ORAL | Status: DC

## 2012-04-27 MED ORDER — SENNOSIDES-DOCUSATE SODIUM 8.6-50 MG PO TABS: 1.0000 | ORAL_TABLET | Freq: Every evening | ORAL | Status: DC | PRN

## 2012-04-27 MED ORDER — INSULIN ASPART 100 UNIT/ML ~~LOC~~ SOLN: SUBCUTANEOUS | Status: DC

## 2012-04-27 NOTE — Progress Notes (Signed)
Clinical Social Work  CSW met with patient in room and gave bed offers. CSW called dtr and left a message with bed offers explaining that when patient was medically stable, a decision would need to be made regarding which SNF they would choose. CSW will continue to follow.  [place], [place] [phone]

## 2012-04-27 NOTE — Progress Notes (Signed)
Occupational Therapy Treatment Patient Details Name: Lorraine Tran MRN: [id number] DOB: 10-28-1921 Today's Date: 04/27/2012 Time: 0811-0828 OT Time Calculation (min): 17 min  OT Assessment / Plan / Recommendation Comments on Treatment Session Pt. very motivated for OOB activity today    Follow Up Recommendations  Skilled nursing facility       Equipment Recommendations  Defer to next venue       Frequency Min 2X/week   Plan Discharge plan remains appropriate    Precautions / Restrictions Precautions Precautions: Fall Restrictions Weight Bearing Restrictions: No       ADL  Grooming: Performed;Set up;Wash/dry face Where Assessed - Grooming: Supported standing;Supported sitting Upper Body Dressing: Performed;Minimal assistance (with donning gown) Where Assessed - Upper Body Dressing: Unsupported sitting Toilet Transfer: Performed;Minimal assistance Toilet Transfer Method: Stand pivot Toilet Transfer Equipment: Bedside commode Toileting - Clothing Manipulation and Hygiene: Performed;Minimal assistance Where Assessed - Engineer, mining and Hygiene: Standing;Other (comment) Transfers/Ambulation Related to ADLs: Min hand held assist ADL Comments: Pt. with decreased activity tolerance with ADLs and with increased pain in rt. knee.       OT Goals Acute Rehab OT Goals OT Goal Formulation: With patient Time For Goal Achievement: 05/07/12 Potential to Achieve Goals: Good ADL Goals Pt Will Perform Grooming: with supervision;Standing at sink ADL Goal: Grooming - Progress: Progressing toward goals Pt Will Perform Upper Body Dressing: with supervision;Sitting, bed ADL Goal: Upper Body Dressing - Progress: Progressing toward goals Pt Will Transfer to Toilet: with supervision;Ambulation;with DME;Regular height toilet ADL Goal: Toilet Transfer - Progress: Progressing toward goals Pt Will Perform Toileting - Clothing Manipulation: with supervision;Standing ADL  Goal: Toileting - Clothing Manipulation - Progress: Progressing toward goals Pt Will Perform Toileting - Hygiene: with supervision;Leaning right and/or left on 3-in-1/toilet ADL Goal: Toileting - Hygiene - Progress: Progressing toward goals  Visit Information  Last OT Received On: 04/27/12 Assistance Needed: +1          Cognition  Overall Cognitive Status: Impaired Area of Impairment: Memory;Awareness of deficits;Problem solving;Awareness of errors;Safety/judgement Difficult to assess due to:  (dementia) Arousal/Alertness: Awake/alert Orientation Level: Disoriented to;Time Behavior During Session: [facility] for tasks performed Memory Deficits: Poor PLOF historian. Safety/Judgement: Decreased awareness of safety precautions;Decreased safety judgement for tasks assessed;Decreased awareness of need for assistance Awareness of Errors: Assistance required to identify errors made Cognition - Other Comments: Patient with ? mmemory deficits as well as safety and judgment issues.      Mobility Bed Mobility Bed Mobility: Supine to Sit Supine to Sit: 4: Min assist;With rails Sitting - Scoot to Edge of Bed: 4: Min assist Details for Bed Mobility Assistance: cues and assist for technique especially for initiation of movement Transfers Transfers: Sit to Stand;Stand to Sit Sit to Stand: 4: Min assist;With upper extremity assist;From bed Stand to Sit: 4: Min assist;With upper extremity assist;With armrests;To chair/3-in-1 Details for Transfer Assistance: Min verbal cues for hand placement and technique         End of Session OT - End of Session Equipment Utilized During Treatment: Gait belt Activity Tolerance: Patient tolerated treatment well Patient left: in chair;with call bell/phone within reach Nurse Communication: Mobility status       Mandalyn Pasqua, OTR/L Pager [phone] 04/27/2012, 8:38 AM

## 2012-04-27 NOTE — Progress Notes (Addendum)
Clinical Social Work:  Spoke with patient daughter who reports her bed choice is [facility]. Have called both liaisons for [facility] and left messages.  Daughter is going over to complete paperwork with anticipation of patient being DC today to [facility].  Patient to transfer by EMS once approved by facility and CSW will arrange.   Will follow up with facility to solidify dc plan.  All dc paperwork has been completed and faxed.  No other needs at this time.  Jennipher Weatherholtz Nail, MSW LCSW [phone]  At 10:30 am.... Spoke with facility with regards to dc plan and bed offer. Agreeable to take patient. Will await call from facility with regards to completed paperwork. Will complete dc and all providers aware.  HN

## 2012-04-27 NOTE — Discharge Summary (Signed)
PATIENT DETAILS Name: Lorraine Tran Age: 78 y.o. Sex: female Date of Birth: 24-Nov-1921 MRN: [id number]. Admit Date: 04/22/2012 Admitting Physician: Eduard Clos, MD WUJ:[email or web address],WGNFAOZH, MD  PRIMARY DISCHARGE DIAGNOSIS:  Principal Problem:  *Syncope Active Problems:  HTN (hypertension)  Diabetes type 2, uncontrolled  Symptomatic bradycardia  Elevated cholesterol  Elevated LDL cholesterol level  Systolic murmur  Dementia  Leukocytosis  Dehydration  Lumbar compression fracture      PAST MEDICAL HISTORY: Past Medical History  Diagnosis Date  . Diabetes mellitus   . Dementia   . Stroke     DISCHARGE MEDICATIONS: Medication List  As of 04/27/2012  9:31 AM   TAKE these medications         acetaminophen 325 MG tablet   Commonly known as: TYLENOL   Take 2 tablets (650 mg total) by mouth every 6 (six) hours as needed.      amLODipine 5 MG tablet   Commonly known as: NORVASC   Take 5 mg by mouth daily.      atorvastatin 10 MG tablet   Commonly known as: LIPITOR   Take 1 tablet (10 mg total) by mouth daily at 6 PM.      clopidogrel 75 MG tablet   Commonly known as: PLAVIX   Take 75 mg by mouth daily.      glipiZIDE 10 MG tablet   Commonly known as: GLUCOTROL   Take 10 mg by mouth 2 (two) times daily before a meal.      insulin aspart 100 UNIT/ML injection   Commonly known as: novoLOG   Subcutaneous, 3 times daily with meals  Correction coverage: Sensitive (thin, NPO, renal)  CBG < 70: implement hypoglycemia protocol  CBG 70 - 120: 0 units  CBG 121 - 150: 1 unit  CBG 151 - 200: 2 units  CBG 201 - 250: 3 units  CBG 251 - 300: 5 units  CBG 301 - 350: 7 units  CBG 351 - 400: 9 units  CBG > 400: call MD      senna-docusate 8.6-50 MG per tablet   Commonly known as: Senokot-S   Take 1 tablet by mouth at bedtime as needed.      sitaGLIPtin 100 MG tablet   Commonly known as: JANUVIA   Take 100 mg by mouth daily.      traMADol 50 MG tablet     Commonly known as: ULTRAM   Take 1 tablet (50 mg total) by mouth every 8 (eight) hours as needed for pain.             BRIEF HPI:  See H&P, Labs, Consult and Test reports for all details in brief, patient was admitted for syncope. While in the ED patient was noted to be bradycardic with heart rate at times in the high 20's and with several sinus pauses as well.She was then admitted for further evaluation and treatment  CONSULTATIONS:  CIR and cardiology  PERTINENT RADIOLOGIC STUDIES: Dg Lumbar Spine Complete  04/22/2012  *RADIOLOGY REPORT*  Clinical Data: Fall, low back pain.  LUMBAR SPINE - COMPLETE 4+ VIEW  Comparison: 10/28/2011  Findings: Moderate leftward scoliosis of the lumbar spine. Associated degenerative disc disease.  Diffuse degenerative facet disease.  Transitional anatomy at the lumbar sacral junction. Moderate compression fracture through L2, slightly progressed since prior study.  No new fracture.  Slight anterolisthesis of L4 on L5, related to facet disease.  SI joints are symmetric and unremarkable.  IMPRESSION: Levoscoliosis.  Advanced degenerative disc  and facet disease.  Progressive L2 compression fracture.  No acute fracture.  Original Report Authenticated By: Cyndie Chime, M.D.   Ct Head Wo Contrast  04/22/2012  *RADIOLOGY REPORT*  Clinical Data:  Fall.  Injury.  CT HEAD WITHOUT CONTRAST CT CERVICAL SPINE WITHOUT CONTRAST  Technique:  Multidetector CT imaging of the head and cervical spine was performed following the standard protocol without intravenous contrast.  Multiplanar CT image reconstructions of the cervical spine were also generated.  Comparison:   None  CT HEAD  Findings: Remote left occipital and bilateral remote parietal infarcts noted.  There is a suggestion of small remote frontal vertex infarct and underlying chronic ischemic microvascular white matter disease.  Old infarcts involving the left basal ganglia noted.  The brainstem and cerebellum appear  unremarkable.  Confluence of the white matter hypodensity in the occipital parietal region noted.  No hydrocephalus, intracranial hemorrhage, or discrete mass lesion is observed.  Atherosclerotic calcification of the carotid siphons noted.  IMPRESSION:  1.  Multiple remote scattered infarcts, without acute intracranial findings. 2.  White matter confluence of hypodensity in the parietal lobes is likely due to white matter infarcts, and less likely to be due to other entities such as posterior reversible encephalopathy or other underlying lesion.  CT CERVICAL SPINE  Findings: There is chronic failure fusion of the posterior arch of C1.  Facet arthropathy is particularly bilaterally at C2-3, C3-4, and C4-5.  Facet arthropathy is also somewhat prominent on the right at T1-2, T2-3, and T3-4.  No cervical spine fracture or acute subluxation is observed.  Loss of intervertebral disc height and posterior osseous ridging noted at C4-5, C5-6, C6-7, and T1-2.  Uncinate and facet spurring cause osseous foraminal stenosis on the left at C4-5 and C5-6, and to a lesser extent at T1-2.  Osseous foraminal narrowing on the right side is present at C3-4, C4-5, C5- 6, C6-7, and T1-2.  There is a small amount of pannus posterior to the odontoid.  IMPRESSION:  1.  Cervical spondylosis with multilevel osseous foraminal stenosis, but without fracture or acute subluxation observed. 2.  Failure fusion of the posterior arch of C1.  Original Report Authenticated By: Dellia Cloud, M.D.   Ct Cervical Spine Wo Contrast  04/22/2012  *RADIOLOGY REPORT*  Clinical Data:  Fall.  Injury.  CT HEAD WITHOUT CONTRAST CT CERVICAL SPINE WITHOUT CONTRAST  Technique:  Multidetector CT imaging of the head and cervical spine was performed following the standard protocol without intravenous contrast.  Multiplanar CT image reconstructions of the cervical spine were also generated.  Comparison:   None  CT HEAD  Findings: Remote left occipital and  bilateral remote parietal infarcts noted.  There is a suggestion of small remote frontal vertex infarct and underlying chronic ischemic microvascular white matter disease.  Old infarcts involving the left basal ganglia noted.  The brainstem and cerebellum appear unremarkable.  Confluence of the white matter hypodensity in the occipital parietal region noted.  No hydrocephalus, intracranial hemorrhage, or discrete mass lesion is observed.  Atherosclerotic calcification of the carotid siphons noted.  IMPRESSION:  1.  Multiple remote scattered infarcts, without acute intracranial findings. 2.  White matter confluence of hypodensity in the parietal lobes is likely due to white matter infarcts, and less likely to be due to other entities such as posterior reversible encephalopathy or other underlying lesion.  CT CERVICAL SPINE  Findings: There is chronic failure fusion of the posterior arch of C1.  Facet arthropathy is particularly bilaterally  at C2-3, C3-4, and C4-5.  Facet arthropathy is also somewhat prominent on the right at T1-2, T2-3, and T3-4.  No cervical spine fracture or acute subluxation is observed.  Loss of intervertebral disc height and posterior osseous ridging noted at C4-5, C5-6, C6-7, and T1-2.  Uncinate and facet spurring cause osseous foraminal stenosis on the left at C4-5 and C5-6, and to a lesser extent at T1-2.  Osseous foraminal narrowing on the right side is present at C3-4, C4-5, C5- 6, C6-7, and T1-2.  There is a small amount of pannus posterior to the odontoid.  IMPRESSION:  1.  Cervical spondylosis with multilevel osseous foraminal stenosis, but without fracture or acute subluxation observed. 2.  Failure fusion of the posterior arch of C1.  Original Report Authenticated By: Dellia Cloud, M.D.   Mr Brain Wo Contrast  04/23/2012  *RADIOLOGY REPORT*  Clinical Data: 78year old hypertensive diabetic with episode of loss of consciousness.  MRI HEAD WITHOUT CONTRAST  Technique:   Multiplanar, multiecho pulse sequences of the brain and surrounding structures were obtained according to standard protocol without intravenous contrast.  Comparison: 04/22/2012 CT.  No comparison MR.  Findings: No acute infarct.  Remote infarcts with encephalomalacia involving the right parietal - occipital lobe, left occipital lobe and medial left temporal lobe.  Smaller remote infarcts involving the basal ganglia and corona radiata.  Moderate small vessel disease type changes.  Global atrophy without hydrocephalus.  No intracranial hemorrhage.  No intracranial mass lesion detected on this unenhanced exam.  Cervical spondylotic changes with spinal stenosis C4-5 and C5-6.  Major intracranial vascular structures are patent.  IMPRESSION: No acute infarct.  Remote infarcts, small vessel disease type changes and atrophy as noted above.  Original Report Authenticated By: Fuller Canada, M.D.   Mr Lumbar Spine Wo Contrast  04/23/2012  *RADIOLOGY REPORT*  Clinical Data: Loss of consciousness.  Back pain.  MRI LUMBAR SPINE WITHOUT CONTRAST  Technique:  Multiplanar and multiecho pulse sequences of the lumbar spine were obtained without intravenous contrast.  Comparison: 04/22/2012 and 10/28/2011 plain film examination of the lumbar spine.  No comparison MR.  Findings: Transitional vertebra has been labeled S1 on prior plain film examination.  Utilizing this level assignment, on the present examination incorporates from the T10-11 disc space through the lower sacrum.  Conus L1-2 level.  Fracture of the sacrum incompletely assessed on the present exam.  Anterior wedge compression fracture of the L2 vertebral body involving superior endplate with 60% loss of height anteriorly. Retropulsion of the posterior-superior aspect of the compressed vertebra combined with broad-based disc protrusion and facet joint degenerative changes is causing L1-2 mild to slightly moderate spinal stenosis and mild bilateral foraminal narrowing.  Compression fracture of L2 is noted on the 10/28/2011 plain film examination but has progressed (edema is seen throughout the vertebral body as well small amount of edema within the adjacent L1 inferior endplate).  Widening of the facet joints.  T10-11:  Negative.  T11-12:  Minimal Schmorl's node deformity.  T12-L1:  Very small Schmorl's node deformity.  L1-2:  As above.  L2-3:  Facet joint degenerative changes.  Ligamentum flavum hypertrophy.  Mild bulge.  Very mild spinal stenosis and foraminal narrowing.  L3-4:  Facet joint degenerative changes and ligamentum flavum hypertrophy greater on the right.  Prominent disc degeneration with broad-based disc osteophyte right lateral position approaching but not compressing the exiting right L3 nerve root.  Mild spinal stenosis greater on the right.  L4-5:  Prominent bilateral facet  joint degenerative changes. Ligamentum flavum hypertrophy.  Minimal anterior slip L4. Bulge/osteophyte greater to the right with mild impression upon the exiting right L4 nerve root.  Multifactorial severe spinal stenosis.  L5-S1:  Prominent facet joint degenerative changes and ligamentum flavum hypertrophy greater on the left.  Moderate bulge/broad-based protrusion osteophyte greater to the left and centrally.  Mild encroaching upon the exiting left L5 nerve root.  Marked left-sided and moderate right-sided lateral recess stenosis.  Moderate spinal stenosis greater on the left.  S1-S2:  Pars defects.  Bony overgrowth.  Minimal anterior slip S1. Bulge/small protrusion with slight cephalad extension.  Lateral osteophyte greater to the right encroaching upon the exiting nerve root.  IMPRESSION:  Transitional vertebra has been labeled S1 on prior plain film examination.  Fracture of the sacrum incompletely assessed on the present exam.  Anterior wedge compression fracture of the L2 vertebral body involving superior endplate with 60% loss of height anteriorly. Retropulsion of the posterior-superior  aspect of the compressed vertebra combined with broad-based disc protrusion and facet joint degenerative changes is causing L1-2 mild to slightly moderate spinal stenosis and mild bilateral foraminal narrowing. Compression fracture of L2 is noted on the 10/28/2011 plain film examination but has progressed.  L3-4 broad-based disc osteophyte right lateral position approaching but not compressing the exiting right L3 nerve root.  Mild spinal stenosis greater on the right.  L4-5 bulge/osteophyte greater to the right with mild impression upon the exiting right L4 nerve root.  Multifactorial severe spinal stenosis.  L5-S1 moderate bulge/broad-based protrusion osteophyte greater to the left and centrally.  Mild encroaching upon the exiting left L5 nerve root.  Marked left-sided and moderate right-sided lateral recess stenosis.  Moderate spinal stenosis greater on the left.  S1-S2 prior defects.  Bony overgrowth.  Minimal anterior slip S1. Bulge/small protrusion with slight cephalad extension.  Lateral osteophyte greater to the right encroaching upon the exiting nerve root.  Please see above further detail.  This has been made a PRA call report utilizing dashboard call feature.  Original Report Authenticated By: Fuller Canada, M.D.   Dg Chest Portable 1 View  04/22/2012  *RADIOLOGY REPORT*  Clinical Data: Fall, back pain.  PORTABLE CHEST - 1 VIEW  Comparison: None  Findings: Mild biapical scarring.  Lungs otherwise clear.  Heart is normal size.  No effusions.  No acute bony abnormality.  IMPRESSION: No active cardiopulmonary disease.  Original Report Authenticated By: Cyndie Chime, M.D.     PERTINENT LAB RESULTS: CBC: No results found for this basename: WBC:2,HGB:2,HCT:2,PLT:2 in the last 72 hours CMET CMP     Component Value Date/Time   NA 138 04/24/2012 0051   K 3.6 04/24/2012 0051   CL 101 04/24/2012 0051   CO2 27 04/24/2012 0051   GLUCOSE 208* 04/24/2012 0051   BUN 13 04/24/2012 0051   CREATININE 0.59  04/24/2012 0051   CALCIUM 8.6 04/24/2012 0051   PROT 5.7* 04/23/2012 0350   ALBUMIN 3.1* 04/23/2012 0350   AST 11 04/23/2012 0350   ALT 7 04/23/2012 0350   ALKPHOS 59 04/23/2012 0350   BILITOT 0.5 04/23/2012 0350   GFRNONAA 80* 04/24/2012 0051   GFRAA >90 04/24/2012 0051    GFR Estimated Creatinine Clearance: 42.8 ml/min (by C-G formula based on Cr of 0.59). No results found for this basename: LIPASE:2,AMYLASE:2 in the last 72 hours  Basename 04/24/12 1639 04/24/12 0936  CKTOTAL 259* 275*  CKMB 6.9* 7.8*  CKMBINDEX -- --  TROPONINI <0.30 <0.30   No  components found with this basename: POCBNP:3 No results found for this basename: DDIMER:2 in the last 72 hours No results found for this basename: HGBA1C:2 in the last 72 hours No results found for this basename: CHOL:2,HDL:2,LDLCALC:2,TRIG:2,CHOLHDL:2,LDLDIRECT:2 in the last 72 hours No results found for this basename: TSH,T4TOTAL,FREET3,T3FREE,THYROIDAB in the last 72 hours No results found for this basename: VITAMINB12:2,FOLATE:2,FERRITIN:2,TIBC:2,IRON:2,RETICCTPCT:2 in the last 72 hours Coags: No results found for this basename: PT:2,INR:2 in the last 72 hours Microbiology: Recent Results (from the past 240 hour(s))  MRSA PCR SCREENING     Status: Normal   Collection Time   04/22/12  9:45 PM      Component Value Range Status Comment   MRSA by PCR NEGATIVE  NEGATIVE Final   URINE CULTURE     Status: Normal   Collection Time   04/23/12 12:20 AM      Component Value Range Status Comment   Specimen Description URINE, CATHETERIZED   Final    Special Requests NONE   Final    Culture  Setup Time 04/23/2012 00:55   Final    Colony Count NO GROWTH   Final    Culture NO GROWTH   Final    Report Status 04/24/2012 FINAL   Final      BRIEF HOSPITAL COURSE:   Principal Problem:  *Syncope  -Seen by EP-Dr. Graciela Husbands  -Syncope hought to be related to orthostatic intolerance issues  -Monitored on Tele-essentially unremarkable -Current plans are for  potential implantation of a loop recorder on discharge, patient will need follow up with Dr Odessa Fleming office on discharge -Bradycardia on admission-thought to be neurally mediated-as patient was vomiting, this has not been seen since -MRI brain on 04/23/2012-negative for acute infarct, old CVA seen  -Cardiac enzymes negative  -Echo- EF 65-70%  Bradycardia  -This was seen on admission, where patient's Heart rate went down to the 20's with several episodes of sinus pauses. -No recurrence since then-Current suspicion for it to be neurally mediated as patient was vomiting. -does have h/o frequent falls-so plans are for Event recorder to be placed by EP once discharged  Aortic Stenosis  -per cards note-moderate AS  -will obtain official results of Echo  -Will need outpatient surveillance monitoring  Leukocytosis  -This was on admission, now resolved  -Afebrile-UA/Urine culture negative for UTI, chest x-ray negative for pneumonia  -Leukocytosis on admission was likely stress response   Dehydration  -Resolved with IV fluids  -Currently euvolemic   Anterior wedge compression fracture of the L2 vertebral body/sacral fracture with pain  *On examination there is no bruising and only a slight bulge of the right superior aspect of the sacrum which is mildly tender. This area remains fixed without evidence movable bone on palpation  *Confirmed on MRI- radiologist was unable to completely assess sacral fracture on this exam  *Intermittant pain- PT/OT evaluation done-recommendations for SNF-family agreeable  -As needed Tylenol and Tramadol  Dyslipidemia  -Continue with statin   History of CVA  -Continue Plavix   History of dementia  -Seems to be at baseline  TODAY-DAY OF DISCHARGE:  Subjective:   Ciji Boston today has no headache,no chest abdominal pain,no new weakness tingling or numbness. Her chronic back pain is better today.  Objective:   Blood pressure 147/65, pulse 72,  temperature 98.2 F (36.8 C), temperature source Oral, resp. rate 17, height 5\' 4"  (1.626 m), weight 56.3 kg (124 lb 1.9 oz), SpO2 95.00%.  Intake/Output Summary (Last 24 hours) at 04/27/12 0931 Last data filed  at 04/26/12 1330  Gross per 24 hour  Intake    120 ml  Output      0 ml  Net    120 ml    Exam Awake Alert, Oriented *3, No new F.N deficits, Normal affect .AT,PERRAL Supple Neck,No JVD, No cervical lymphadenopathy appriciated.  Symmetrical Chest wall movement, Good air movement bilaterally, CTAB RRR,No Gallops,Rubs or new Murmurs, No Parasternal Heave +ve B.Sounds, Abd Soft, Non tender, No organomegaly appriciated, No rebound -guarding or rigidity. No Cyanosis, Clubbing or edema, No new Rash or bruise  DISPOSITION: SNF  DISCHARGE INSTRUCTIONS:    Follow-up Information    Follow up with Darrow Bussing, MD. Schedule an appointment as soon as possible for a visit in 2 weeks. (after discharge from SNF)    Contact information:   [address] [place] [address] [phone]       Follow up with Sherryl Manges, MD. (need to make appointment upon discharge to get event monitor placement)    Contact information:   [address], [address] [place] [address] [phone]         Total Time spent on discharge equals 45 minutes.  SignedJeoffrey Massed 04/27/2012 9:31 AM

## 2012-05-25 ENCOUNTER — Encounter (INDEPENDENT_AMBULATORY_CARE_PROVIDER_SITE_OTHER): Payer: Medicare Other

## 2012-05-25 DIAGNOSIS — R55 Syncope and collapse: Secondary | ICD-10-CM

## 2012-05-30 ENCOUNTER — Telehealth: Payer: Self-pay | Admitting: *Deleted

## 2012-05-30 NOTE — Telephone Encounter (Signed)
SPOKE WITH PTS DAUGHTER  ANNABELL  AT  [phone]  TO SET UP APPT WITH DR Graciela Husbands  FIRST AVAILABLE NOT UNTIL 08/02/12   RE MONITOR SHOWS  NEW [facility]  PER DR Eden Emms    DR Graciela Husbands  ORDERED WILL FORWARD TO HEATHER MCGHEE RN  TO MAKE EARLIER APPT .Lorraine Tran

## 2012-05-31 NOTE — Telephone Encounter (Signed)
Strips received. I will review with Dr. Graciela Husbands as to when he would like to see the patient as his scheduled is full at this time. I will see if/ when he wants the patient worked in.

## 2012-06-04 ENCOUNTER — Telehealth: Payer: Self-pay | Admitting: Internal Medicine

## 2012-06-04 NOTE — Telephone Encounter (Signed)
Patient's daughter is calling back to see when Dr. Graciela Husbands can see pt. Daughter is aware that Heather Dr. Odessa Fleming nurse will call her after she  Review pt's strip with MD. Daughter verbalized understanding.

## 2012-06-04 NOTE — Telephone Encounter (Signed)
Please return call to patient daughter Lanier Ensign [phone]  She has questions about pt med care

## 2012-06-05 NOTE — Telephone Encounter (Signed)
Dr. Odessa Fleming schedule has opened for tomorrow. Will forward to [place] to contact the patient. Eval a-fibMarlowe Kays has event strips.

## 2012-06-06 ENCOUNTER — Encounter: Payer: Self-pay | Admitting: Internal Medicine

## 2012-06-06 ENCOUNTER — Ambulatory Visit (INDEPENDENT_AMBULATORY_CARE_PROVIDER_SITE_OTHER): Payer: Medicare Other | Admitting: Internal Medicine

## 2012-06-06 VITALS — BP 156/71 | HR 76 | Ht 60.0 in | Wt 118.8 lb

## 2012-06-06 DIAGNOSIS — I10 Essential (primary) hypertension: Secondary | ICD-10-CM

## 2012-06-06 DIAGNOSIS — R001 Bradycardia, unspecified: Secondary | ICD-10-CM

## 2012-06-06 DIAGNOSIS — R55 Syncope and collapse: Secondary | ICD-10-CM

## 2012-06-06 DIAGNOSIS — I4891 Unspecified atrial fibrillation: Secondary | ICD-10-CM

## 2012-06-06 DIAGNOSIS — I498 Other specified cardiac arrhythmias: Secondary | ICD-10-CM

## 2012-06-06 DIAGNOSIS — I359 Nonrheumatic aortic valve disorder, unspecified: Secondary | ICD-10-CM

## 2012-06-06 DIAGNOSIS — I350 Nonrheumatic aortic (valve) stenosis: Secondary | ICD-10-CM

## 2012-06-06 HISTORY — DX: Unspecified atrial fibrillation: I48.91

## 2012-06-06 MED ORDER — APIXABAN 2.5 MG PO TABS: 2.5000 mg | ORAL_TABLET | Freq: Two times a day (BID) | ORAL | Status: DC

## 2012-06-06 MED ORDER — METOPROLOL TARTRATE 25 MG PO TABS: 25.0000 mg | ORAL_TABLET | Freq: Two times a day (BID) | ORAL | Status: DC

## 2012-06-06 NOTE — Patient Instructions (Addendum)
Your physician recommends that you schedule a follow-up appointment in: 1 MONTH WITH PA  AND 3 MONTHS WITH DR Graciela Husbands Your physician has recommended you make the following change in your medication:  STOP AMLODIPINE AND PLAVIX START  ELIQUIS 2.5 MG TWICE DAILY  AND METOPROLOL 25 MG 1 TWICE DAILY

## 2012-06-06 NOTE — Assessment & Plan Note (Signed)
Patient has atrial fibrillation with a rapid rate. This may or may not be related to her syncope. It's clearly can be. We'll continue her event recorder.  Her thromboembolic risk profile is very high. Not withstanding her risk of falls, her stroke rate is a high, but I think anticoagulation is appropriate  Creatinine clearance is sufficient to allow the use of apixoban her age and weight is suggested 2.5 mg twice daily is appropriate. We will stop her Plavix. We have discussed the risk benefit assessment as noted above.  Given the rapid rate, we will discontinue her amlodipine and begin her on low-dose beta blocker with metoprolol 25 twice daily

## 2012-06-06 NOTE — Assessment & Plan Note (Signed)
As above.

## 2012-06-06 NOTE — Progress Notes (Signed)
  HPI  Lorraine Tran is a 78 y.o. female  Seen [place] from hospital following presentation with recurrent syncope occurring over many years with epiphenomeonon consistent with neurally mediated spells but without documented orthostatic intolerance She had a history of some bradycardia with this we thought was related to nausea and vomiting. She has no prior history of atrial fibrillation. She is seen today because of atrial fibrillation detected on the event recorder which had been prescribed at discharge try to identify potential arrhythmia that could have triggered her syncope.  Thromboembolic risk factors are notable for prior stroke, age-89, diabetes, gender, hypertension, for a chads score of 5 and a CHADS-VASc score of 6  Echocardiogram this July 2013 demonstrated normal left ventricular function moderate aortic stenosis with a valve area of 1 mild mitral stenosis and left atrial dilatation    Past Medical History  Diagnosis Date  . Diabetes mellitus   . Dementia   . Stroke   . Atrial fibrillation 06/06/2012    Past Surgical History  Procedure Date  . Abdominal hysterectomy   . Cholecystectomy     Current Outpatient Prescriptions  Medication Sig Dispense Refill  . acetaminophen (TYLENOL) 325 MG tablet Take 2 tablets (650 mg total) by mouth every 6 (six) hours as needed.      Marland Kitchen amLODipine (NORVASC) 5 MG tablet Take 5 mg by mouth daily.      Marland Kitchen atorvastatin (LIPITOR) 10 MG tablet Take 1 tablet (10 mg total) by mouth daily at 6 PM.      . clopidogrel (PLAVIX) 75 MG tablet Take 75 mg by mouth daily.      Marland Kitchen glipiZIDE (GLUCOTROL) 10 MG tablet Take 10 mg by mouth 2 (two) times daily before a meal.      . senna-docusate (SENOKOT-S) 8.6-50 MG per tablet Take 1 tablet by mouth at bedtime as needed.      . sitaGLIPtin (JANUVIA) 100 MG tablet Take 100 mg by mouth daily.        Allergies  Allergen Reactions  . Penicillins     Review of Systems negative except from HPI and  PMH  Physical Exam BP 156/71  Pulse 76  Ht 5' (1.524 m)  Wt 118 lb 12.8 oz (53.887 kg)  BMI 23.20 kg/m2 Well developed and well nourished in no acute distress HENT normal E scleral and icterus clear Neck Supple JVP flat; carotids brisk and full Clear to ausculation Regular rate and rhythm, 2/6 systolic murmer Soft with active bowel sounds No clubbing cyanosis none Edema Alert and oriented, grossly normal motor and sensory function Skin Warm and Dry  Sinus rhythm at 70 Intervals 14/07/37  Assessment and  Plan

## 2012-06-06 NOTE — Assessment & Plan Note (Signed)
Not sure if related to autonomics

## 2012-06-14 ENCOUNTER — Telehealth: Payer: Self-pay | Admitting: Internal Medicine

## 2012-06-14 NOTE — Telephone Encounter (Signed)
I spoke with [place]. They state the patient has dementia and lives at home with her husband whose dementia is worse. They have a daughter who comes by and helps with their medications once a week, but she is going back to work and will not be able to help them. The patient has not seen her PCP in quite some time. Lorraine Tran would like to get a Child psychotherapist out to the home to assess the situation and possibly assist with placement to a facility for at least the patient or her husband. Per Dr. Graciela Husbands, he will give the ok for the social work consult.

## 2012-06-14 NOTE — Telephone Encounter (Signed)
New msg [facility] called and said that pt's BP today 150/56. She is requesting social worker to be sent out to home.

## 2012-06-14 NOTE — Telephone Encounter (Signed)
I left a message for [facility] to call. The patient's SBP at her office visit was 157. I was trying to find out why she wanted the social worker consult.

## 2012-06-14 NOTE — Telephone Encounter (Signed)
Fu call [place] nurse calling you back

## 2012-06-20 ENCOUNTER — Telehealth: Payer: Self-pay | Admitting: Internal Medicine

## 2012-06-20 NOTE — Telephone Encounter (Signed)
I left a message for the patient's daughter to call. 

## 2012-06-20 NOTE — Telephone Encounter (Signed)
New Problem:     Per daughter the patient is no longer wearing the monitor and refuses to put it back on.  Please call back.

## 2012-06-21 NOTE — Telephone Encounter (Signed)
I spoke with the patient's daughter and made her aware that she does not need to wear the monitor any further. I explained that Dr. Graciela Husbands reviewed the strips that have been sent and that he documented "rare a-fib" on there.

## 2012-06-21 NOTE — Telephone Encounter (Signed)
I left a message for the patient's daughter to call. 

## 2012-07-10 ENCOUNTER — Ambulatory Visit: Payer: Medicare Other | Admitting: Physician Assistant

## 2012-07-17 ENCOUNTER — Ambulatory Visit: Payer: Medicare Other | Admitting: Physician Assistant

## 2012-07-23 ENCOUNTER — Encounter: Payer: Self-pay | Admitting: Physician Assistant

## 2012-07-23 ENCOUNTER — Ambulatory Visit (INDEPENDENT_AMBULATORY_CARE_PROVIDER_SITE_OTHER): Payer: Medicare Other | Admitting: Physician Assistant

## 2012-07-23 VITALS — BP 128/64 | HR 63 | Resp 18 | Ht 63.0 in | Wt 119.8 lb

## 2012-07-23 DIAGNOSIS — I4891 Unspecified atrial fibrillation: Secondary | ICD-10-CM

## 2012-07-23 DIAGNOSIS — I10 Essential (primary) hypertension: Secondary | ICD-10-CM

## 2012-07-23 DIAGNOSIS — I359 Nonrheumatic aortic valve disorder, unspecified: Secondary | ICD-10-CM

## 2012-07-23 DIAGNOSIS — I350 Nonrheumatic aortic (valve) stenosis: Secondary | ICD-10-CM

## 2012-07-23 NOTE — Patient Instructions (Addendum)
Please follow with Dr. Graciela Husbands in 08/2012  Your physician recommends that you return for lab work in: today bmet, cbc w/diff

## 2012-07-23 NOTE — Progress Notes (Signed)
[address] [place], [place]  [address] Phone: [phone] Fax:  [phone]  Date:  07/23/2012   Name:  Lorraine Tran   DOB:  10/06/1922   MRN:  [id number]  PCP:  Darrow Bussing, MD  Primary Cardiologist/Primary Electrophysiologist:  Dr. Sherryl Manges    History of Present Illness: Lorraine Tran is a 78 y.o. female who returns for follow up.  She has a hx of DM2, prior stroke, dementia, mod AS and syncope.  Recently dx with AFib on event monitor.  She has normal LVF on recent echo.  She has significant TE risk factors and Dr. Sherryl Manges placed her on Eliquis.  She has RVR with AFib.  He placed her on low dose metoprolol and stopped amlodipine.  Plavix was also stopped.  Doing well since last seen.  Event monitor completed and demonstrated NSR, SB and rare AFib.  The patient denies chest pain, shortness of breath, syncope, orthopnea, PND or significant pedal edema.    Wt Readings from Last 3 Encounters:  07/23/12 119 lb 12.8 oz (54.341 kg)  06/06/12 118 lb 12.8 oz (53.887 kg)  04/24/12 124 lb 1.9 oz (56.3 kg)     Past Medical History  Diagnosis Date  . Diabetes mellitus   . Dementia   . Stroke   . Atrial fibrillation 06/06/2012    Eliquis started 8/13  . Aortic stenosis     a. Echo 6/13:  mild LVH, EF 65-70%, LV vigorous, mod AS, mean 26 mmHg, MAC, mild MS, mean 4 mmHg, mild LAE, PASP 48    Current Outpatient Prescriptions  Medication Sig Dispense Refill  . apixaban (ELIQUIS) 2.5 MG TABS tablet Take 1 tablet (2.5 mg total) by mouth 2 (two) times daily.  60 tablet  11  . glipiZIDE (GLUCOTROL) 10 MG tablet Take 10 mg by mouth 2 (two) times daily before a meal.      . metoprolol tartrate (LOPRESSOR) 25 MG tablet Take 1 tablet (25 mg total) by mouth 2 (two) times daily.  60 tablet  11  . sitaGLIPtin (JANUVIA) 100 MG tablet Take 100 mg by mouth daily.      Marland Kitchen acetaminophen (TYLENOL) 325 MG tablet Take 2 tablets (650 mg total) by mouth every 6  (six) hours as needed.      Marland Kitchen atorvastatin (LIPITOR) 10 MG tablet Take 1 tablet (10 mg total) by mouth daily at 6 PM.      . senna-docusate (SENOKOT-S) 8.6-50 MG per tablet Take 1 tablet by mouth at bedtime as needed.        Allergies: Allergies  Allergen Reactions  . Penicillins     History  Substance Use Topics  . Smoking status: Never Smoker   . Smokeless tobacco: Never Used  . Alcohol Use: No     ROS:  Please see the history of present illness.   No melena, hematochezia.   All other systems reviewed and negative.   PHYSICAL EXAM: VS:  BP 128/64  Pulse 63  Resp 18  Ht 5\' 3"  (1.6 m)  Wt 119 lb 12.8 oz (54.341 kg)  BMI 21.22 kg/m2  SpO2 93% Well nourished, well developed, in no acute distress HEENT: normal Neck: no JVD at 90 degrees Cardiac:  normal S1, S2; RRR; harsh 2/6 systolic murmur RUSB Lungs:  clear to auscultation bilaterally, no wheezing, rhonchi or rales Abd: soft, nontender  Ext: no edema Skin: warm and dry Neuro:  CNs 2-12 intact, no focal abnormalities noted  ASSESSMENT AND PLAN:  1. Atrial Fibrillation:  This is infrequent.  But her CHADS2 score is 5.  Continue Eliquis.  Check cbc and bmet today.  2. Hypertension:  Controlled.  Continue current therapy.  3. Aortic Stenosis:  Consider repeat echo in 1 year.  Overall, she is likely not a candidate for open replacement.    4. Syncope:  No recurrence.  Follow up with Dr. Sherryl Manges in 08/2012.  Signed, Tereso Newcomer, PA-C  4:32 PM 07/23/2012

## 2012-07-24 LAB — CBC WITH DIFFERENTIAL/PLATELET
Basophils Absolute: 0 10*3/uL (ref 0.0–0.1)
Basophils Relative: 0.5 % (ref 0.0–3.0)
Eosinophils Absolute: 0.1 10*3/uL (ref 0.0–0.7)
MCHC: 32.7 g/dL (ref 30.0–36.0)
MCV: 86.8 fl (ref 78.0–100.0)
Monocytes Absolute: 1 10*3/uL (ref 0.1–1.0)
Neutro Abs: 4.3 10*3/uL (ref 1.4–7.7)
Neutrophils Relative %: 55.7 % (ref 43.0–77.0)
RBC: 4.28 Mil/uL (ref 3.87–5.11)
RDW: 14.6 % (ref 11.5–14.6)

## 2012-07-24 LAB — BASIC METABOLIC PANEL
CO2: 30 mEq/L (ref 19–32)
Calcium: 9 mg/dL (ref 8.4–10.5)
Chloride: 101 mEq/L (ref 96–112)
Creatinine, Ser: 0.6 mg/dL (ref 0.4–1.2)
Glucose, Bld: 194 mg/dL — ABNORMAL HIGH (ref 70–99)

## 2012-07-25 ENCOUNTER — Telehealth: Payer: Self-pay | Admitting: *Deleted

## 2012-07-25 NOTE — Telephone Encounter (Signed)
Message copied by Tarri Fuller on Wed Jul 25, 2012 11:19 AM ------      Message from: [place], [place] T      Created: Tue Jul 24, 2012  4:38 PM       Good      Continue with current treatment plan.      Tereso Newcomer, PA-C  4:38 PM 07/24/2012

## 2012-07-25 NOTE — Telephone Encounter (Signed)
lmom labs good 

## 2012-09-12 ENCOUNTER — Ambulatory Visit (INDEPENDENT_AMBULATORY_CARE_PROVIDER_SITE_OTHER): Payer: Medicare Other | Admitting: Internal Medicine

## 2012-09-12 ENCOUNTER — Encounter: Payer: Self-pay | Admitting: Internal Medicine

## 2012-09-12 VITALS — BP 154/68 | HR 71 | Ht 60.0 in | Wt 114.0 lb

## 2012-09-12 DIAGNOSIS — R55 Syncope and collapse: Secondary | ICD-10-CM

## 2012-09-12 DIAGNOSIS — I10 Essential (primary) hypertension: Secondary | ICD-10-CM

## 2012-09-12 DIAGNOSIS — I4891 Unspecified atrial fibrillation: Secondary | ICD-10-CM

## 2012-09-12 NOTE — Progress Notes (Signed)
Patient Care Team: Dibas Koirala, MD as PCP - General (Family Medicine)   HPI  Lorraine Tran is a 78 y.o. female Seen in followup from hospital following presentation with recurrent syncope occurring over many years with epiphenomeonon consistent with neurally mediated spells but without documented orthostatic intolerance  She had a history of some bradycardia with this which we thought was related to nausea and vomiting. She has no prior history of atrial fibrillation. She is seen today because of atrial fibrillation detected on the event recorder   Thromboembolic risk factors are notable for prior stroke, age-58, diabetes, gender, hypertension, for a chads score of 5 and a CHADS-VASc score of 6   Echocardiogram this July 2013 demonstrated normal left ventricular function moderate aortic stenosis with a valve area of 1 mild mitral stenosis and left atrial dilatation  She was started on our requests beta blockers. She was seen by Tereso Newcomer 6 weeks ago and was doing better. seh continues to do well without sob or chest pain or syncope   Past Medical History  Diagnosis Date  . Diabetes mellitus   . Dementia   . Stroke   . Atrial fibrillation 06/06/2012    Eliquis started 8/13  . Aortic stenosis     a. Echo 6/13:  mild LVH, EF 65-70%, LV vigorous, mod AS, mean 26 mmHg, MAC, mild MS, mean 4 mmHg, mild LAE, PASP 48    Past Surgical History  Procedure Date  . Abdominal hysterectomy   . Cholecystectomy     Current Outpatient Prescriptions  Medication Sig Dispense Refill  . glipiZIDE (GLUCOTROL) 10 MG tablet Take 10 mg by mouth 2 (two) times daily before a meal.      . metoprolol tartrate (LOPRESSOR) 25 MG tablet Take 1 tablet (25 mg total) by mouth 2 (two) times daily.  60 tablet  11  . sitaGLIPtin (JANUVIA) 100 MG tablet Take 100 mg by mouth daily.      Marland Kitchen acetaminophen (TYLENOL) 325 MG tablet Take 2 tablets (650 mg total) by mouth every 6 (six) hours as needed.         Allergies  Allergen Reactions  . Penicillins     Review of Systems negative except from HPI and PMH  Physical Exam BP 154/68  Pulse 71  Ht 5' (1.524 m)  Wt 114 lb (51.71 kg)  BMI 22.26 kg/m2 Well developed and well nourished in no acute distress HENT normal E scleral and icterus clear Neck Supple JVP flat; carotids brisk and full Clear to ausculation  Regular rate and rhythm, 3/6 systolic murmur with preserved s2 Soft with active bowel sounds No clubbing cyanosis Trace Edema Alert   grossly normal motor and sensory function Skin Warm and Dry  ECG: Sinus Rhythm   @71             Intervals  13/07/36  Axis 65     Assessment and  Plan

## 2012-09-12 NOTE — Assessment & Plan Note (Addendum)
Holding sinus  Continue apixoban  Have discussed the [employer]

## 2012-09-12 NOTE — Assessment & Plan Note (Signed)
No recurrent syncope 

## 2012-09-28 ENCOUNTER — Telehealth: Payer: Self-pay | Admitting: Internal Medicine

## 2012-09-28 NOTE — Telephone Encounter (Signed)
plz return call to pt daughter Annabell [phone] to discuss medication .

## 2012-09-28 NOTE — Telephone Encounter (Signed)
Lorraine Tran I think you like to know this.

## 2012-09-28 NOTE — Telephone Encounter (Signed)
F/u   Patient daughter Annabell,  Found pt meds in the trash no need to return call. Closing encounter. Meka

## 2013-05-27 ENCOUNTER — Other Ambulatory Visit: Payer: Self-pay | Admitting: Internal Medicine

## 2013-05-27 NOTE — Telephone Encounter (Signed)
Rx request on Eliquis and Metoprolol, she is on metoprolol but I dont see Eliquis. Plus she hasn't been here since last year it looks like.

## 2013-05-28 NOTE — Telephone Encounter (Signed)
I'm not sure why the Eliquis isn't on her list. Per Dr. Odessa Fleming last office note in 08/2012, he said he was going to continue this. It is ok to refill.

## 2013-09-11 ENCOUNTER — Other Ambulatory Visit: Payer: Self-pay | Admitting: Internal Medicine

## 2013-09-20 ENCOUNTER — Other Ambulatory Visit: Payer: Self-pay | Admitting: Internal Medicine

## 2013-10-01 ENCOUNTER — Ambulatory Visit (INDEPENDENT_AMBULATORY_CARE_PROVIDER_SITE_OTHER): Payer: Medicare Other | Admitting: Cardiology

## 2013-10-01 ENCOUNTER — Encounter: Payer: Self-pay | Admitting: Cardiology

## 2013-10-01 VITALS — BP 150/80 | HR 72 | Wt 127.0 lb

## 2013-10-01 DIAGNOSIS — I350 Nonrheumatic aortic (valve) stenosis: Secondary | ICD-10-CM

## 2013-10-01 DIAGNOSIS — R55 Syncope and collapse: Secondary | ICD-10-CM

## 2013-10-01 DIAGNOSIS — I4891 Unspecified atrial fibrillation: Secondary | ICD-10-CM

## 2013-10-01 DIAGNOSIS — I359 Nonrheumatic aortic valve disorder, unspecified: Secondary | ICD-10-CM

## 2013-10-01 MED ORDER — METOPROLOL TARTRATE 25 MG PO TABS: ORAL_TABLET | ORAL | Status: DC

## 2013-10-01 MED ORDER — APIXABAN 2.5 MG PO TABS: ORAL_TABLET | ORAL | Status: DC

## 2013-10-01 NOTE — Progress Notes (Signed)
ELECTROPHYSIOLOGY OFFICE NOTE  Patient ID: Lorraine Tran MRN: [id number], DOB/AGE: 1922/01/06   Date of Visit: 10/01/2013  Primary Physician: Darrow Bussing, MD Primary Cardiologist: Berton Mount, MD Reason for Visit: EP follow-up  History of Present Illness  Lorraine Tran is a pleasant 79 y.o. female with dementia, PAF, neurally mediated syncope and moderate AS who presents today for routine electrophysiology followup. She is accompanied by her daughter who is her primary caregiver and assists with history. Her daughter states, "She's doing fine. The only reason we came today is because the pharmacy wouldn't refill her medications and said we had to be seen."   Since last being seen in our clinic, she reports she is doing well and has no complaints. She denies chest pain or shortness of breath. She denies palpitations, dizziness, near syncope or syncope. She denies LE swelling, orthopnea or PND. She is compliant with medications. She has not had any trouble with bleeding or falls.  Past Medical History Past Medical History  Diagnosis Date  . Diabetes mellitus   . Dementia   . Stroke   . Atrial fibrillation 06/06/2012    Eliquis started 8/13  . Aortic stenosis     a. Echo 6/13:  mild LVH, EF 65-70%, LV vigorous, mod AS, mean 26 mmHg, MAC, mild MS, mean 4 mmHg, mild LAE, PASP 48    Past Surgical History Past Surgical History  Procedure Laterality Date  . Abdominal hysterectomy    . Cholecystectomy      Allergies/Intolerances Allergies  Allergen Reactions  . Penicillins     Current Home Medications Current Outpatient Prescriptions  Medication Sig Dispense Refill  . ELIQUIS 2.5 MG TABS tablet TAKE 1 TABLET BY MOUTH TWICE DAILY  60 tablet  0  . glipiZIDE (GLUCOTROL) 10 MG tablet Take 10 mg by mouth 2 (two) times daily before a meal.      . metoprolol tartrate (LOPRESSOR) 25 MG tablet TAKE 1 TABLET BY MOUTH TWICE DAILY  60 tablet  0  . sitaGLIPtin (JANUVIA) 100 MG tablet  Take 100 mg by mouth daily.       No current facility-administered medications for this visit.    Social History History   Social History  . Marital Status: Married    Spouse Name: N/A    Number of Children: N/A  . Years of Education: N/A   Occupational History  . Not on file.   Social History Main Topics  . Smoking status: Never Smoker   . Smokeless tobacco: Never Used  . Alcohol Use: No  . Drug Use: No  . Sexual Activity: Not on file   Other Topics Concern  . Not on file   Social History Narrative  . No narrative on file     Review of Systems General: No chills, fever, night sweats or weight changes Cardiovascular: No chest pain, dyspnea on exertion, edema, orthopnea, palpitations, paroxysmal nocturnal dyspnea Dermatological: No rash, lesions or masses Respiratory: No cough, dyspnea Urologic: No hematuria, dysuria Abdominal: No nausea, vomiting, diarrhea, bright red blood per rectum, melena, or hematemesis Neurologic: No visual changes, weakness, changes in mental status All other systems reviewed and are otherwise negative except as noted above.  Physical Exam Vitals: Blood pressure 150/80, pulse 72, weight 127 lb (57.607 kg).  General: Well developed, well appearing 79 y.o. female in no acute distress. HEENT: Normocephalic, atraumatic. EOMs intact. Sclera nonicteric. Oropharynx clear.  Neck: Supple. No JVD. Lungs: Respirations regular and unlabored, CTA bilaterally. No wheezes, rales or  rhonchi. Heart: RRR. S1, S2 present. No murmurs, rub, S3 or S4. Abdomen: Soft, non-distended.  Extremities: No clubbing, cyanosis or edema. PT/Radials 2+ and equal bilaterally. Psych: Normal affect. Neuro: Alert and oriented X 3. Moves all extremities spontaneously.   Diagnostics 12-lead ECG today - NSR at 53 bpm with normal intervals; no ST-T wave abnormalities  Assessment and Plan 1. PAF - maintaining SR - continue BB for rate control - continue low dose Eliquis for  stroke prevention - follow-up with Dr. Graciela Husbands in 6 months 2. Neurally mediated syncope - stable without recurrence 3. Moderate AS - asymptomatic, without CP, SOB, dizziness or syncope - no need for repeat echo at this time  Signed, Rick Duff, PA-C 10/01/2013, 4:44 PM

## 2013-10-01 NOTE — Patient Instructions (Signed)
Your physician recommends that you continue on your current medications as directed. Please refer to the Current Medication list given to you today.  Your physician wants you to follow-up in: 6 months with Dr. Klein. You will receive a reminder letter in the mail two months in advance. If you don't receive a letter, please call our office to schedule the follow-up appointment.  

## 2013-10-03 ENCOUNTER — Other Ambulatory Visit: Payer: Self-pay | Admitting: *Deleted

## 2013-10-03 DIAGNOSIS — I4891 Unspecified atrial fibrillation: Secondary | ICD-10-CM

## 2013-10-03 MED ORDER — APIXABAN 2.5 MG PO TABS: ORAL_TABLET | ORAL | Status: DC

## 2014-07-04 ENCOUNTER — Inpatient Hospital Stay (HOSPITAL_COMMUNITY): Payer: Medicare Other

## 2014-07-04 ENCOUNTER — Inpatient Hospital Stay (HOSPITAL_COMMUNITY)
Admission: EM | Admit: 2014-07-04 | Discharge: 2014-07-09 | DRG: 492 | Disposition: A | Discharge status: Skilled Nursing Facility | Payer: Medicare Other | Attending: Internal Medicine | Admitting: Internal Medicine

## 2014-07-04 ENCOUNTER — Encounter (HOSPITAL_COMMUNITY): Payer: Self-pay | Admitting: Emergency Medicine

## 2014-07-04 ENCOUNTER — Emergency Department (HOSPITAL_COMMUNITY): Payer: Medicare Other

## 2014-07-04 DIAGNOSIS — E119 Type 2 diabetes mellitus without complications: Secondary | ICD-10-CM | POA: Diagnosis present

## 2014-07-04 DIAGNOSIS — S92002A Unspecified fracture of left calcaneus, initial encounter for closed fracture: Secondary | ICD-10-CM

## 2014-07-04 DIAGNOSIS — IMO0002 Reserved for concepts with insufficient information to code with codable children: Secondary | ICD-10-CM

## 2014-07-04 DIAGNOSIS — Z8673 Personal history of transient ischemic attack (TIA), and cerebral infarction without residual deficits: Secondary | ICD-10-CM | POA: Diagnosis not present

## 2014-07-04 DIAGNOSIS — Z7901 Long term (current) use of anticoagulants: Secondary | ICD-10-CM | POA: Diagnosis not present

## 2014-07-04 DIAGNOSIS — E7800 Pure hypercholesterolemia, unspecified: Secondary | ICD-10-CM

## 2014-07-04 DIAGNOSIS — Z79899 Other long term (current) drug therapy: Secondary | ICD-10-CM | POA: Diagnosis not present

## 2014-07-04 DIAGNOSIS — I350 Nonrheumatic aortic (valve) stenosis: Secondary | ICD-10-CM

## 2014-07-04 DIAGNOSIS — G9340 Encephalopathy, unspecified: Secondary | ICD-10-CM | POA: Diagnosis present

## 2014-07-04 DIAGNOSIS — S92009A Unspecified fracture of unspecified calcaneus, initial encounter for closed fracture: Secondary | ICD-10-CM | POA: Diagnosis present

## 2014-07-04 DIAGNOSIS — E860 Dehydration: Secondary | ICD-10-CM | POA: Diagnosis present

## 2014-07-04 DIAGNOSIS — D72829 Elevated white blood cell count, unspecified: Secondary | ICD-10-CM

## 2014-07-04 DIAGNOSIS — R9431 Abnormal electrocardiogram [ECG] [EKG]: Secondary | ICD-10-CM

## 2014-07-04 DIAGNOSIS — I359 Nonrheumatic aortic valve disorder, unspecified: Secondary | ICD-10-CM | POA: Diagnosis present

## 2014-07-04 DIAGNOSIS — Y92009 Unspecified place in unspecified non-institutional (private) residence as the place of occurrence of the external cause: Secondary | ICD-10-CM | POA: Diagnosis not present

## 2014-07-04 DIAGNOSIS — S82209A Unspecified fracture of shaft of unspecified tibia, initial encounter for closed fracture: Secondary | ICD-10-CM | POA: Diagnosis present

## 2014-07-04 DIAGNOSIS — R739 Hyperglycemia, unspecified: Secondary | ICD-10-CM

## 2014-07-04 DIAGNOSIS — I4891 Unspecified atrial fibrillation: Secondary | ICD-10-CM | POA: Diagnosis present

## 2014-07-04 DIAGNOSIS — S82409A Unspecified fracture of shaft of unspecified fibula, initial encounter for closed fracture: Secondary | ICD-10-CM | POA: Diagnosis present

## 2014-07-04 DIAGNOSIS — Z66 Do not resuscitate: Secondary | ICD-10-CM | POA: Diagnosis present

## 2014-07-04 DIAGNOSIS — I480 Paroxysmal atrial fibrillation: Secondary | ICD-10-CM

## 2014-07-04 DIAGNOSIS — F0390 Unspecified dementia without behavioral disturbance: Secondary | ICD-10-CM

## 2014-07-04 DIAGNOSIS — S82899A Other fracture of unspecified lower leg, initial encounter for closed fracture: Secondary | ICD-10-CM | POA: Diagnosis present

## 2014-07-04 DIAGNOSIS — D62 Acute posthemorrhagic anemia: Secondary | ICD-10-CM | POA: Diagnosis not present

## 2014-07-04 DIAGNOSIS — I248 Other forms of acute ischemic heart disease: Secondary | ICD-10-CM | POA: Diagnosis present

## 2014-07-04 DIAGNOSIS — W19XXXA Unspecified fall, initial encounter: Secondary | ICD-10-CM | POA: Diagnosis present

## 2014-07-04 DIAGNOSIS — I2489 Other forms of acute ischemic heart disease: Secondary | ICD-10-CM | POA: Diagnosis present

## 2014-07-04 DIAGNOSIS — Z01810 Encounter for preprocedural cardiovascular examination: Secondary | ICD-10-CM

## 2014-07-04 DIAGNOSIS — M79609 Pain in unspecified limb: Secondary | ICD-10-CM | POA: Diagnosis present

## 2014-07-04 DIAGNOSIS — R7309 Other abnormal glucose: Secondary | ICD-10-CM

## 2014-07-04 DIAGNOSIS — R001 Bradycardia, unspecified: Secondary | ICD-10-CM

## 2014-07-04 DIAGNOSIS — I10 Essential (primary) hypertension: Secondary | ICD-10-CM

## 2014-07-04 DIAGNOSIS — E1165 Type 2 diabetes mellitus with hyperglycemia: Secondary | ICD-10-CM

## 2014-07-04 DIAGNOSIS — S82402A Unspecified fracture of shaft of left fibula, initial encounter for closed fracture: Secondary | ICD-10-CM | POA: Diagnosis present

## 2014-07-04 DIAGNOSIS — S82202A Unspecified fracture of shaft of left tibia, initial encounter for closed fracture: Secondary | ICD-10-CM

## 2014-07-04 LAB — CK
CK TOTAL: 605 U/L — AB (ref 7–177)
CK TOTAL: 640 U/L — AB (ref 7–177)

## 2014-07-04 LAB — I-STAT TROPONIN, ED
TROPONIN I, POC: 0.07 ng/mL (ref 0.00–0.08)
Troponin i, poc: 0.04 ng/mL (ref 0.00–0.08)

## 2014-07-04 LAB — CBC WITH DIFFERENTIAL/PLATELET
BASOS ABS: 0 10*3/uL (ref 0.0–0.1)
Basophils Relative: 0 % (ref 0–1)
Eosinophils Absolute: 0 10*3/uL (ref 0.0–0.7)
Eosinophils Relative: 0 % (ref 0–5)
HCT: 33.7 % — ABNORMAL LOW (ref 36.0–46.0)
Hemoglobin: 11.3 g/dL — ABNORMAL LOW (ref 12.0–15.0)
LYMPHS PCT: 3 % — AB (ref 12–46)
Lymphs Abs: 0.7 10*3/uL (ref 0.7–4.0)
MCH: 29.5 pg (ref 26.0–34.0)
MCHC: 33.5 g/dL (ref 30.0–36.0)
MCV: 88 fL (ref 78.0–100.0)
MONO ABS: 1.4 10*3/uL — AB (ref 0.1–1.0)
Monocytes Relative: 7 % (ref 3–12)
NEUTROS ABS: 18 10*3/uL — AB (ref 1.7–7.7)
Neutrophils Relative %: 90 % — ABNORMAL HIGH (ref 43–77)
Platelets: 261 10*3/uL (ref 150–400)
RBC: 3.83 MIL/uL — ABNORMAL LOW (ref 3.87–5.11)
RDW: 13.8 % (ref 11.5–15.5)
WBC: 20.1 10*3/uL — AB (ref 4.0–10.5)

## 2014-07-04 LAB — HEPATIC FUNCTION PANEL
ALBUMIN: 3.5 g/dL (ref 3.5–5.2)
ALT: 21 U/L (ref 0–35)
AST: 29 U/L (ref 0–37)
Alkaline Phosphatase: 47 U/L (ref 39–117)
Bilirubin, Direct: <0.2 mg/dL (ref 0.0–0.3)
Total Bilirubin: 0.8 mg/dL (ref 0.3–1.2)
Total Protein: 6 g/dL (ref 6.0–8.3)

## 2014-07-04 LAB — I-STAT CHEM 8, ED
BUN: 24 mg/dL — ABNORMAL HIGH (ref 6–23)
Calcium, Ion: 1.13 mmol/L (ref 1.13–1.30)
Chloride: 106 mEq/L (ref 96–112)
Creatinine, Ser: 0.9 mg/dL (ref 0.50–1.10)
Glucose, Bld: 353 mg/dL — ABNORMAL HIGH (ref 70–99)
HEMATOCRIT: 35 % — AB (ref 36.0–46.0)
HEMOGLOBIN: 11.9 g/dL — AB (ref 12.0–15.0)
Potassium: 4.2 mEq/L (ref 3.7–5.3)
SODIUM: 139 meq/L (ref 137–147)
TCO2: 19 mmol/L (ref 0–100)

## 2014-07-04 LAB — CBG MONITORING, ED
GLUCOSE-CAPILLARY: 287 mg/dL — AB (ref 70–99)
Glucose-Capillary: 299 mg/dL — ABNORMAL HIGH (ref 70–99)

## 2014-07-04 LAB — PROTIME-INR
INR: 1.35 (ref 0.00–1.49)
Prothrombin Time: 16.7 seconds — ABNORMAL HIGH (ref 11.6–15.2)

## 2014-07-04 LAB — I-STAT CG4 LACTIC ACID, ED: Lactic Acid, Venous: 4.6 mmol/L — ABNORMAL HIGH (ref 0.5–2.2)

## 2014-07-04 LAB — TROPONIN I: Troponin I: <0.3 ng/mL (ref <0.30)

## 2014-07-04 LAB — CREATININE, SERUM
Creatinine, Ser: 0.86 mg/dL (ref 0.50–1.10)
GFR calc non Af Amer: 58 mL/min — ABNORMAL LOW (ref >90)
GFR, EST AFRICAN AMERICAN: 67 mL/min — AB (ref >90)

## 2014-07-04 LAB — LACTIC ACID, PLASMA: Lactic Acid, Venous: 3.8 mmol/L — ABNORMAL HIGH (ref 0.5–2.2)

## 2014-07-04 MED ORDER — SODIUM CHLORIDE 0.9 % IV SOLN
INTRAVENOUS | Status: DC
  Administered 2014-07-04 – 2014-07-05 (×2): via INTRAVENOUS

## 2014-07-04 MED ORDER — HYDROMORPHONE HCL PF 1 MG/ML IJ SOLN
0.5000 mg | INTRAMUSCULAR | Status: DC | PRN
  Administered 2014-07-05: 0.5 mg via INTRAVENOUS
  Administered 2014-07-06: 1 mg via INTRAVENOUS
  Administered 2014-07-06 (×2): 0.5 mg via INTRAVENOUS
  Filled 2014-07-04 (×4): qty 1

## 2014-07-04 MED ORDER — SODIUM CHLORIDE 0.9 % IV BOLUS (SEPSIS)
1000.0000 mL | Freq: Once | INTRAVENOUS | Status: AC
  Administered 2014-07-04: 1000 mL via INTRAVENOUS

## 2014-07-04 MED ORDER — ONDANSETRON HCL 4 MG/2ML IJ SOLN
INTRAMUSCULAR | Status: AC
  Administered 2014-07-04: 4 mg
  Filled 2014-07-04: qty 2

## 2014-07-04 MED ORDER — FENTANYL CITRATE 0.05 MG/ML IJ SOLN
50.0000 ug | Freq: Once | INTRAMUSCULAR | Status: AC
  Administered 2014-07-04: 50 ug via INTRAVENOUS
  Filled 2014-07-04: qty 2

## 2014-07-04 MED ORDER — INSULIN ASPART 100 UNIT/ML ~~LOC~~ SOLN
0.0000 [IU] | SUBCUTANEOUS | Status: DC
  Administered 2014-07-04: 5 [IU] via SUBCUTANEOUS
  Administered 2014-07-05 (×4): 2 [IU] via SUBCUTANEOUS
  Administered 2014-07-05: 5 [IU] via SUBCUTANEOUS
  Administered 2014-07-05: 2 [IU] via SUBCUTANEOUS
  Administered 2014-07-06: 3 [IU] via SUBCUTANEOUS
  Administered 2014-07-06: 1 [IU] via SUBCUTANEOUS
  Administered 2014-07-06: 5 [IU] via SUBCUTANEOUS
  Administered 2014-07-07 (×2): 2 [IU] via SUBCUTANEOUS
  Administered 2014-07-07 (×3): 3 [IU] via SUBCUTANEOUS
  Administered 2014-07-08 (×2): 2 [IU] via SUBCUTANEOUS
  Administered 2014-07-08: 1 [IU] via SUBCUTANEOUS
  Administered 2014-07-08: 3 [IU] via SUBCUTANEOUS
  Administered 2014-07-08: 2 [IU] via SUBCUTANEOUS
  Filled 2014-07-04: qty 1

## 2014-07-04 MED ORDER — HEPARIN SODIUM (PORCINE) 5000 UNIT/ML IJ SOLN
5000.0000 [IU] | Freq: Three times a day (TID) | INTRAMUSCULAR | Status: AC
  Administered 2014-07-04 – 2014-07-06 (×6): 5000 [IU] via SUBCUTANEOUS
  Filled 2014-07-04 (×10): qty 1

## 2014-07-04 NOTE — H&P (Signed)
Hospitalist Admission History and Physical  Patient name: Lorraine Tran Medical record number: [id number] Date of birth: May 22, 1922 Age: 80 y.o. Gender: female  Primary Care Provider: Darrow Bussing, MD  Chief Complaint: fall, leg fracture, encephalopathy, hyperglycemia, dehydration, leukocytosis   History of Present Illness:This is a 80 y.o. year old female with significant past medical history of dementia, type 2 DM, CVA, afib on eliquus,  presenting with fall. Per daughter, pt was found down in front of her house earlier this afternoon. Daughter is unaware of how long pt was down for. However, did see pt trying to eat grass. Pt was found off edge of front door stoop, which is about 3-4 feet off of the ground. Did notice L leg swelling. Pt was confused. Per the daughter, pt and husband (who is also demented) live at home alone. Daughter is only caregiver who visits pt 3-4 times per week. Pt relocated her from [place] about 3 years ago. Daughter called EMS.  On arrival to ER, tmax 99.1, HR 90s-100. RR 10s-20s. BP 130s-150s/60s-100s. Satting 96% on RA. WBC 20.1, Hgb 11.3. Chem 8 w/ Cr 0.9, BUN 24, Glu 353. CK 605, lactate 4.6.  Multiple imaging studies done. L tib fib w/ mildly displace comminuted proximal tib-fib fx, L calcaneal intraarticular fracture, old compressin fractures on T and L spine-no acute findings. Pelvis and CXR WNL. Head CT w/ old cerebral infarcts-no acute findings. No c spine bony injury. EKG w/ ? Anterolateral ST depression.   Assessment and Plan: Lorraine Tran is a 80 y.o. year old female presenting with fall, leg fracture, dehydration, encephalopathy, hyperglycemia, leukocytosis.    Active Problems:   Fall   Leg fracture, left   Encephalopathy   Hyperglycemia   1-Fall/Encephalopathy  -Unclear etiology -Likely multifactorial with contributions including dementia, hyperglycemia, dehydration  -Hydrate pt -MRI brain -check ammonia level  -CXR w/ signs of  infection. F/u on UA  -trend CK and lactate   2-Leg fracture -multiple fractures on imaging including L tib fib and calcaneus  -ortho consulted -f/u recs  -hold eliquus  -pain control   3-Hyperglycemia  -Dehydration likely exacerbating hyperglycemia  -hydrate pt  -SSI, A1C   4-Afib  -previously on eliquus -hold given leg fracture  -hemodynamically stable currently  -noted ? ST depression on EKG -cycle CEs  -Cards c/s by EDP   5-Leukocytosis -likely reactive in setting of above  -dry on exam  -will pan culture -f/u UA  -HOLD abx in the interim. Low threshold if pt spikes fever.      FEN/GI: NPO  Prophylaxis: sub q heparin  Disposition: pending further evaluation  Code Status:DNR    Patient Active Problem List   Diagnosis Date Noted  . Fall 07/04/2014  . Leg fracture, left 07/04/2014  . Encephalopathy 07/04/2014  . Hyperglycemia 07/04/2014  . Atrial fibrillation 06/06/2012  . Symptomatic bradycardia 04/23/2012  . Elevated cholesterol 04/23/2012  . Elevated LDL cholesterol level 04/23/2012  . Aortic stenosis 04/23/2012  . Dementia 04/23/2012  . Leukocytosis 04/23/2012  . Dehydration 04/23/2012  . Lumbar compression fracture 04/23/2012  . Syncope 04/22/2012  . HTN (hypertension) 04/22/2012  . Diabetes type 2, uncontrolled 04/22/2012   Past Medical History: Past Medical History  Diagnosis Date  . Diabetes mellitus   . Dementia   . Stroke   . Atrial fibrillation 06/06/2012    Eliquis started 8/13  . Aortic stenosis     a. Echo 6/13:  mild LVH, EF 65-70%, LV vigorous, mod AS, mean 26 mmHg,  MAC, mild MS, mean 4 mmHg, mild LAE, PASP 48    Past Surgical History: Past Surgical History  Procedure Laterality Date  . Abdominal hysterectomy    . Cholecystectomy      Social History: History   Social History  . Marital Status: Married    Spouse Name: N/A    Number of Children: N/A  . Years of Education: N/A   Social History Main Topics  . Smoking  status: Never Smoker   . Smokeless tobacco: Never Used  . Alcohol Use: No  . Drug Use: No  . Sexual Activity: None   Other Topics Concern  . None   Social History Narrative  . None    Family History: No family history on file.  Allergies: Allergies  Allergen Reactions  . Penicillins     Current Facility-Administered Medications  Medication Dose Route Frequency Provider Last Rate Last Dose  . 0.9 %  sodium chloride infusion   Intravenous Continuous Doree Albee, MD      . heparin injection 5,000 Units  5,000 Units Subcutaneous 3 times per day Doree Albee, MD      . HYDROmorphone (DILAUDID) injection 0.5-1 mg  0.5-1 mg Intravenous Q2H PRN Doree Albee, MD      . insulin aspart (novoLOG) injection 0-9 Units  0-9 Units Subcutaneous 6 times per day Doree Albee, MD       Current Outpatient Prescriptions  Medication Sig Dispense Refill  . apixaban (ELIQUIS) 2.5 MG TABS tablet TAKE 1 TABLET BY MOUTH TWICE DAILY  60 tablet  6  . glipiZIDE (GLUCOTROL) 10 MG tablet Take 10 mg by mouth 2 (two) times daily before a meal.      . metoprolol tartrate (LOPRESSOR) 25 MG tablet TAKE 1 TABLET BY MOUTH TWICE DAILY  60 tablet  11  . sitaGLIPtin (JANUVIA) 100 MG tablet Take 100 mg by mouth daily.       Review Of Systems: 12 point ROS negative except as noted above in HPI.  Physical Exam: Filed Vitals:   07/04/14 1900  BP: 145/113  Pulse: 99  Temp:   Resp: 22    General: confused, non verbal  HEENT: PERRLA, extra ocular movement intact and dry oral mucosa Heart: S1, S2 normal, no murmur, rub or gallop, regular rate and rhythm Lungs: clear to auscultation Abdomen: abdomen is soft without significant tenderness, masses, organomegaly or guarding Extremities: RLE swelling, bruising, TTP, externally rotated  Skin:as above  Neurology: confused, minimally cooperative to exam   Labs and Imaging: Lab Results  Component Value Date/Time   NA 139 07/04/2014  5:22 PM   K 4.2 07/04/2014   5:22 PM   CL 106 07/04/2014  5:22 PM   CO2 30 07/23/2012  4:51 PM   BUN 24* 07/04/2014  5:22 PM   CREATININE 0.90 07/04/2014  5:22 PM   GLUCOSE 353* 07/04/2014  5:22 PM   Lab Results  Component Value Date   WBC 20.1* 07/04/2014   HGB 11.9* 07/04/2014   HCT 35.0* 07/04/2014   MCV 88.0 07/04/2014   PLT 261 07/04/2014    Dg Chest 1 View  07/04/2014   CLINICAL DATA:  Found down.  Altered mental status.  EXAM: CHEST - 1 VIEW  COMPARISON:  Chest x-ray 04/22/2012.  FINDINGS: Lung volumes are low. No consolidative airspace disease. No pleural effusions. No pneumothorax. No pulmonary nodule or mass noted. Pulmonary vasculature and the cardiomediastinal silhouette are within normal limits. Atherosclerosis in the thoracic aorta. Mild bilateral apical pleural  parenchymal thickening, presumably post infectious or inflammatory scarring (unchanged).  IMPRESSION: 1. Low lung volumes without radiographic evidence of acute cardiopulmonary disease. 2. Atherosclerosis.   Electronically Signed   By: Trudie Reed M.D.   On: 07/04/2014 18:58   Dg Thoracic Spine 2 View  07/04/2014   CLINICAL DATA:  Patient fell.  Altered mental status.  EXAM: THORACIC SPINE - 2 VIEW  COMPARISON:  Chest x-rays dated 07/04/2014 and 04/22/2012  FINDINGS: There is a very slight anterior wedge deformity of the superior endplate of T4, age indeterminate. There is diffuse osteopenia. T1 is not well visualized on the available images.  Heart size is normal.  Suggestion of a moderate hiatal hernia.  IMPRESSION: Slight compression deformity of the superior endplate of T4, age indeterminate. T1 is not well visualized on the lateral view.   Electronically Signed   By: Geanie Cooley M.D.   On: 07/04/2014 19:02   Dg Lumbar Spine Complete  07/04/2014   CLINICAL DATA:  Found down.  Altered mental status.  EXAM: LUMBAR SPINE - COMPLETE 4+ VIEW  COMPARISON:  Lumbar spine MRI 04/23/2012.  FINDINGS: Old compression fracture of L1 with approximately 70% loss of  anterior vertebral body height. No new acute displaced fractures or compression type fractures are otherwise noted. Multilevel degenerative disc disease, most severe at L5-S1. Severe multilevel facet arthropathy, most severe at L4-L5 and L5-S1. There is a 5 mm of anterolisthesis of L4 upon L5. 8 mm of anterolisthesis of L5 upon S1. Mild exaggeration of normal lumbar lordosis and mild levoscoliosis in the mid lumbar spine, but alignment is otherwise anatomic, although there is a mild acute kyphosis at the level of T12-L1, related to the chronic compression fracture. Extensive atherosclerosis.  IMPRESSION: 1. No acute findings. 2. Extensive multilevel degenerative disc disease, lumbar spondylosis, mild lumbar levoscoliosis, and old compression fracture at L2 are similar to prior examinations, as above.   Electronically Signed   By: Trudie Reed M.D.   On: 07/04/2014 19:04   Dg Pelvis 1-2 Views  07/04/2014   CLINICAL DATA:  Patient found down.  EXAM: PELVIS - 1-2 VIEW  COMPARISON:  Leg radiographs today.  FINDINGS: Diffuse osteopenia. No displaced pelvic fracture. Atherosclerosis. Both hips are externally rotated. No displaced hip fracture is identified. Lumbar spondylosis. Moderate to large amount of stool in the rectosigmoid.  IMPRESSION: No acute osseous abnormality.   Electronically Signed   By: Andreas Newport M.D.   On: 07/04/2014 19:01   Dg Tibia/fibula Left  07/04/2014   CLINICAL DATA:  Altered mental status.  Leg pain.  Found down.  EXAM: LEFT TIBIA AND FIBULA - 2 VIEW  COMPARISON:  None.  FINDINGS: Comminuted proximal tibial diaphysis fracture is present. 7 mm medial displacement of the medial cortical shard. Nonstandard projections are submitted. There also is a proximal to mid fibular shaft fracture with one shaft width medial displacement. Diffuse osteopenia. Atherosclerosis.  IMPRESSION: Study degraded by nonstandard projections. Mildly displaced comminuted proximal tibial shaft fracture.  Proximal to mid fibular shaft fracture with 1 shaft width medial displacement.   Electronically Signed   By: Andreas Newport M.D.   On: 07/04/2014 18:58   Dg Ankle Complete Left  07/04/2014   CLINICAL DATA:  Left ankle swelling.  EXAM: LEFT ANKLE COMPLETE - 3+ VIEW  COMPARISON:  None.  FINDINGS: An oblique calcaneal fracture is identified extending from the subtalar joint to the inferior surface.  No other fracture, subluxation or dislocation identified.  Mild degenerative changes at the  tibiotalar joint noted.  Heavy vascular calcifications are identified.  IMPRESSION: Intraarticular calcaneal fracture extending from the subtalar joint to the inferior surface.   Electronically Signed   By: Laveda Abbe M.D.   On: 07/04/2014 19:02   Ct Head Wo Contrast  07/04/2014   CLINICAL DATA:  Fall.  Found down.  Confused.  EXAM: CT HEAD WITHOUT CONTRAST  CT CERVICAL SPINE WITHOUT CONTRAST  TECHNIQUE: Multidetector CT imaging of the head and cervical spine was performed following the standard protocol without intravenous contrast. Multiplanar CT image reconstructions of the cervical spine were also generated.  COMPARISON:  CT head and cervical spine 04/22/2012  FINDINGS: CT HEAD FINDINGS  Stable extensive encephalomalacia in the parietal lobes bilaterally and in the left occipital lobe, suggestive of prior infarctions. Stable chronic hypodensity in the periventricular white matter bilaterally. Remote basal ganglia infarcts bilaterally are unchanged. Diffuse cerebral volume loss noted. Ventricles are stable in size. Negative for intra or extra-axial hemorrhage, evidence of hydrocephalus, or CT findings of acute cortically based infarction.  Extensive atherosclerotic calcification in the visualized vertebral arteries and the visualized intracranial internal carotid arteries. Skull is intact. Visualized paranasal sinuses and mastoid air cells are clear.  CT CERVICAL SPINE FINDINGS  Cervical spine is normally aligned from the  skullbase through the cervicothoracic junction. Moderate disc height narrowing at C4-5, C5-6, and C6-7 is without significant change. Posterior osseous spurring and anterior osteophyte formation is seen at each of these levels. There is also mild posterior osseous spurring at C3-C4. Facet joint degenerative changes are noted bilaterally at C3-C4 and C4-C5.  Incomplete fusion of the posterior arch of C1 is again noted.  Moderate osseous neural foraminal narrowing on the right at C3-C4. Mild bilateral neural foraminal narrowing at C4-C5. Mild bilateral neural foraminal narrowing at C5-C6. Mild bilateral neural foraminal narrowing at C6-C7.  IMPRESSION: 1. No acute intracranial abnormality identified. Extensive chronic remote cerebral infarcts and chronic white matter ischemic changes. 2. Chronic cervical spondylosis. 3. No evidence of acute bony injury to the cervical spine.   Electronically Signed   By: Britta Mccreedy M.D.   On: 07/04/2014 18:24   Ct Cervical Spine Wo Contrast  07/04/2014   CLINICAL DATA:  Patient found down.  Confusion.  EXAM: CT CERVICAL SPINE WITHOUT CONTRAST  TECHNIQUE: Multidetector CT imaging of the cervical spine was performed without intravenous contrast. Multiplanar CT image reconstructions were also generated.  COMPARISON:  Cervical spine CT scan 04/22/2012.  FINDINGS: No acute displaced fractures in the cervical spine. Severe multilevel degenerative disc disease again noted, most pronounced at C6-C7. Moderate multilevel facet arthropathy. Alignment is anatomic. Prevertebral soft tissues are normal. Incidental note is again made of incomplete fusion of the posterior arch of C1 (normal variant). Incidental imaging of the upper thorax is unremarkable.  IMPRESSION: 1. No acute abnormality of the cervical spine. 2. Severe multilevel degenerative disc disease and cervical spondylosis again noted, as above.   Electronically Signed   By: Trudie Reed M.D.   On: 07/04/2014 18:18            Doree Albee MD  Pager: [phone]

## 2014-07-04 NOTE — ED Notes (Signed)
[phone] Daughter Wilhemina Cash.

## 2014-07-04 NOTE — Consult Note (Signed)
Cardiology Consult Note Darrow Bussing, MD  Reason for consult: ST depressions on EKG  History of Present Illness (and review of medical records): Lorraine Tran is a 80 y.o. female with PMHx significant for paroxsymal atrial fibrillation, CVA, HTN, DM, and dementia seen last by Dr. Graciela Husbands in cardiology 2013.  She is now admitted to the family medicine service after being down outside near her house by daugther.  Unfortunately, husband is also dementia, and is unclear how long patient was outside and down. Pt. Is unable to state how long she was down for.  Pt was confused per daughter. Blood glucose was in 200s.  BPs was 130-170 systolic, hR 95-110. Labs were remarkable for CK 605, WBC 20.1, Venous lactic acid 4.6.  Trop was 0.04.  She had no acute findings on head CT.  She did have Xrays revealing left tib/fib fracture and left calcaneus fracture.  There was concern for her ST depression on ekgs and cardiology was consulted for further evaluation.  Review of Systems Review of systems not obtained due to patient factors.  Patient Active Problem List   Diagnosis Date Noted  . Fall 07/04/2014  . Leg fracture, left 07/04/2014  . Encephalopathy 07/04/2014  . Hyperglycemia 07/04/2014  . Atrial fibrillation 06/06/2012  . Symptomatic bradycardia 04/23/2012  . Elevated cholesterol 04/23/2012  . Elevated LDL cholesterol level 04/23/2012  . Aortic stenosis 04/23/2012  . Dementia 04/23/2012  . Leukocytosis 04/23/2012  . Dehydration 04/23/2012  . Lumbar compression fracture 04/23/2012  . Syncope 04/22/2012  . HTN (hypertension) 04/22/2012  . Diabetes type 2, uncontrolled 04/22/2012   Past Medical History  Diagnosis Date  . Diabetes mellitus   . Dementia   . Stroke   . Atrial fibrillation 06/06/2012    Eliquis started 8/13  . Aortic stenosis     a. Echo 6/13:  mild LVH, EF 65-70%, LV vigorous, mod AS, mean 26 mmHg, MAC, mild MS, mean 4 mmHg, mild LAE, PASP 48    Past Surgical History   Procedure Laterality Date  . Abdominal hysterectomy    . Cholecystectomy      Prescriptions prior to admission  Medication Sig Dispense Refill  . apixaban (ELIQUIS) 2.5 MG TABS tablet TAKE 1 TABLET BY MOUTH TWICE DAILY  60 tablet  6  . glipiZIDE (GLUCOTROL) 10 MG tablet Take 10 mg by mouth 2 (two) times daily before a meal.      . metoprolol tartrate (LOPRESSOR) 25 MG tablet TAKE 1 TABLET BY MOUTH TWICE DAILY  60 tablet  11  . sitaGLIPtin (JANUVIA) 100 MG tablet Take 100 mg by mouth daily.       Allergies  Allergen Reactions  . Penicillins     History  Substance Use Topics  . Smoking status: Never Smoker   . Smokeless tobacco: Never Used  . Alcohol Use: No    No family history on file.   Objective:  Patient Vitals for the past 8 hrs:  BP Temp Temp src Pulse Resp SpO2 Height Weight  07/04/14 2220 125/60 mmHg 97.6 F (36.4 C) Oral 110 20 100 % 5\' 3"  (1.6 m) 57.5 kg (126 lb 12.2 oz)  07/04/14 2030 170/58 mmHg - - 110 21 99 % - -  07/04/14 2000 152/43 mmHg - - 95 15 90 % - -  07/04/14 1900 145/113 mmHg - - 99 22 98 % - -  07/04/14 1743 - 99.5 F (37.5 C) Rectal - - - - -  07/04/14 1730 135/108 mmHg - -  99 20 98 % - -  07/04/14 1715 152/69 mmHg - - 97 20 96 % - -  07/04/14 1711 136/48 mmHg 98.3 F (36.8 C) Oral 100 18 100 % - -  07/04/14 1710 145/49 mmHg 98.3 F (36.8 C) Oral 97 25 96 % - -   General appearance: elderly appearing female, no acute distress Head: No obvious head trauma Eyes: conjunctivae/corneas clear.  Neck: supple, no JVD Lungs: clear to auscultation anteriorly Heart: regular rate and rhythm, systolic murmur present Abdomen: soft, non-tender; bowel sounds normal; Extremities: left leg in brace from knee to foot Neurologic: disoriented  Results for orders placed during the hospital encounter of 07/04/14 (from the past 48 hour(s))  PROTIME-INR     Status: Abnormal   Collection Time    07/04/14  5:15 PM      Result Value Ref Range   Prothrombin Time  16.7 (*) 11.6 - 15.2 seconds   INR 1.35  0.00 - 1.49  CBC WITH DIFFERENTIAL     Status: Abnormal   Collection Time    07/04/14  5:15 PM      Result Value Ref Range   WBC 20.1 (*) 4.0 - 10.5 K/uL   RBC 3.83 (*) 3.87 - 5.11 MIL/uL   Hemoglobin 11.3 (*) 12.0 - 15.0 g/dL   HCT 33.7 (*) 36.0 - 46.0 %   MCV 88.0  78.0 - 100.0 fL   MCH 29.5  26.0 - 34.0 pg   MCHC 33.5  30.0 - 36.0 g/dL   RDW 13.8  11.5 - 15.5 %   Platelets 261  150 - 400 K/uL   Neutrophils Relative % 90 (*) 43 - 77 %   Neutro Abs 18.0 (*) 1.7 - 7.7 K/uL   Lymphocytes Relative 3 (*) 12 - 46 %   Lymphs Abs 0.7  0.7 - 4.0 K/uL   Monocytes Relative 7  3 - 12 %   Monocytes Absolute 1.4 (*) 0.1 - 1.0 K/uL   Eosinophils Relative 0  0 - 5 %   Eosinophils Absolute 0.0  0.0 - 0.7 K/uL   Basophils Relative 0  0 - 1 %   Basophils Absolute 0.0  0.0 - 0.1 K/uL  CK     Status: Abnormal   Collection Time    07/04/14  5:15 PM      Result Value Ref Range   Total CK 605 (*) 7 - 177 U/L  HEPATIC FUNCTION PANEL     Status: None   Collection Time    07/04/14  5:15 PM      Result Value Ref Range   Total Protein 6.0  6.0 - 8.3 g/dL   Albumin 3.5  3.5 - 5.2 g/dL   AST 29  0 - 37 U/L   ALT 21  0 - 35 U/L   Alkaline Phosphatase 47  39 - 117 U/L   Total Bilirubin 0.8  0.3 - 1.2 mg/dL   Bilirubin, Direct <0.2  0.0 - 0.3 mg/dL   Indirect Bilirubin NOT CALCULATED  0.3 - 0.9 mg/dL  CREATININE, SERUM     Status: Abnormal   Collection Time    07/04/14  5:15 PM      Result Value Ref Range   Creatinine, Ser 0.86  0.50 - 1.10 mg/dL   GFR calc non Af Amer 58 (*) >90 mL/min   GFR calc Af Amer 67 (*) >90 mL/min   Comment: (NOTE)     The eGFR has been calculated using the  CKD EPI equation.     This calculation has not been validated in all clinical situations.     eGFR's persistently <90 mL/min signify possible Chronic Kidney     Disease.  CBG MONITORING, ED     Status: Abnormal   Collection Time    07/04/14  5:16 PM      Result Value Ref  Range   Glucose-Capillary 299 (*) 70 - 99 mg/dL   Comment 1 Documented in Chart     Comment 2 Notify RN    Randolm Idol, ED     Status: None   Collection Time    07/04/14  5:20 PM      Result Value Ref Range   Troponin i, poc 0.04  0.00 - 0.08 ng/mL   Comment 3            Comment: Due to the release kinetics of cTnI,     a negative result within the first hours     of the onset of symptoms does not rule out     myocardial infarction with certainty.     If myocardial infarction is still suspected,     repeat the test at appropriate intervals.  I-STAT CHEM 8, ED     Status: Abnormal   Collection Time    07/04/14  5:22 PM      Result Value Ref Range   Sodium 139  137 - 147 mEq/L   Potassium 4.2  3.7 - 5.3 mEq/L   Chloride 106  96 - 112 mEq/L   BUN 24 (*) 6 - 23 mg/dL   Creatinine, Ser 0.90  0.50 - 1.10 mg/dL   Glucose, Bld 353 (*) 70 - 99 mg/dL   Calcium, Ion 1.13  1.13 - 1.30 mmol/L   TCO2 19  0 - 100 mmol/L   Hemoglobin 11.9 (*) 12.0 - 15.0 g/dL   HCT 35.0 (*) 36.0 - 46.0 %  I-STAT CG4 LACTIC ACID, ED     Status: Abnormal   Collection Time    07/04/14  5:26 PM      Result Value Ref Range   Lactic Acid, Venous 4.60 (*) 0.5 - 2.2 mmol/L  LACTIC ACID, PLASMA     Status: Abnormal   Collection Time    07/04/14  8:47 PM      Result Value Ref Range   Lactic Acid, Venous 3.8 (*) 0.5 - 2.2 mmol/L  CK     Status: Abnormal   Collection Time    07/04/14  8:47 PM      Result Value Ref Range   Total CK 640 (*) 7 - 177 U/L  TROPONIN I     Status: None   Collection Time    07/04/14  8:47 PM      Result Value Ref Range   Troponin I <0.30  <0.30 ng/mL   Comment:            Due to the release kinetics of cTnI,     a negative result within the first hours     of the onset of symptoms does not rule out     myocardial infarction with certainty.     If myocardial infarction is still suspected,     repeat the test at appropriate intervals.  CBG MONITORING, ED     Status: Abnormal    Collection Time    07/04/14  8:53 PM      Result Value Ref Range   Glucose-Capillary 287 (*) 70 -  99 mg/dL  Randolm Idol, ED     Status: None   Collection Time    07/04/14  9:07 PM      Result Value Ref Range   Troponin i, poc 0.07  0.00 - 0.08 ng/mL   Comment 3            Comment: Due to the release kinetics of cTnI,     a negative result within the first hours     of the onset of symptoms does not rule out     myocardial infarction with certainty.     If myocardial infarction is still suspected,     repeat the test at appropriate intervals.   Dg Chest 1 View  07/04/2014   CLINICAL DATA:  Found down.  Altered mental status.  EXAM: CHEST - 1 VIEW  COMPARISON:  Chest x-ray 04/22/2012.  FINDINGS: Lung volumes are low. No consolidative airspace disease. No pleural effusions. No pneumothorax. No pulmonary nodule or mass noted. Pulmonary vasculature and the cardiomediastinal silhouette are within normal limits. Atherosclerosis in the thoracic aorta. Mild bilateral apical pleural parenchymal thickening, presumably post infectious or inflammatory scarring (unchanged).  IMPRESSION: 1. Low lung volumes without radiographic evidence of acute cardiopulmonary disease. 2. Atherosclerosis.   Electronically Signed   By: Vinnie Langton M.D.   On: 07/04/2014 18:58   Dg Thoracic Spine 2 View  07/04/2014   CLINICAL DATA:  Patient fell.  Altered mental status.  EXAM: THORACIC SPINE - 2 VIEW  COMPARISON:  Chest x-rays dated 07/04/2014 and 04/22/2012  FINDINGS: There is a very slight anterior wedge deformity of the superior endplate of T4, age indeterminate. There is diffuse osteopenia. T1 is not well visualized on the available images.  Heart size is normal.  Suggestion of a moderate hiatal hernia.  IMPRESSION: Slight compression deformity of the superior endplate of T4, age indeterminate. T1 is not well visualized on the lateral view.   Electronically Signed   By: Rozetta Nunnery M.D.   On: 07/04/2014 19:02    Dg Lumbar Spine Complete  07/04/2014   CLINICAL DATA:  Found down.  Altered mental status.  EXAM: LUMBAR SPINE - COMPLETE 4+ VIEW  COMPARISON:  Lumbar spine MRI 04/23/2012.  FINDINGS: Old compression fracture of L1 with approximately 70% loss of anterior vertebral body height. No new acute displaced fractures or compression type fractures are otherwise noted. Multilevel degenerative disc disease, most severe at L5-S1. Severe multilevel facet arthropathy, most severe at L4-L5 and L5-S1. There is a 5 mm of anterolisthesis of L4 upon L5. 8 mm of anterolisthesis of L5 upon S1. Mild exaggeration of normal lumbar lordosis and mild levoscoliosis in the mid lumbar spine, but alignment is otherwise anatomic, although there is a mild acute kyphosis at the level of T12-L1, related to the chronic compression fracture. Extensive atherosclerosis.  IMPRESSION: 1. No acute findings. 2. Extensive multilevel degenerative disc disease, lumbar spondylosis, mild lumbar levoscoliosis, and old compression fracture at L2 are similar to prior examinations, as above.   Electronically Signed   By: Vinnie Langton M.D.   On: 07/04/2014 19:04   Dg Pelvis 1-2 Views  07/04/2014   CLINICAL DATA:  Patient found down.  EXAM: PELVIS - 1-2 VIEW  COMPARISON:  Leg radiographs today.  FINDINGS: Diffuse osteopenia. No displaced pelvic fracture. Atherosclerosis. Both hips are externally rotated. No displaced hip fracture is identified. Lumbar spondylosis. Moderate to large amount of stool in the rectosigmoid.  IMPRESSION: No acute osseous abnormality.   Electronically Signed  By: Dereck Ligas M.D.   On: 07/04/2014 19:01   Dg Tibia/fibula Left  07/04/2014   CLINICAL DATA:  Altered mental status.  Leg pain.  Found down.  EXAM: LEFT TIBIA AND FIBULA - 2 VIEW  COMPARISON:  None.  FINDINGS: Comminuted proximal tibial diaphysis fracture is present. 7 mm medial displacement of the medial cortical shard. Nonstandard projections are submitted. There  also is a proximal to mid fibular shaft fracture with one shaft width medial displacement. Diffuse osteopenia. Atherosclerosis.  IMPRESSION: Study degraded by nonstandard projections. Mildly displaced comminuted proximal tibial shaft fracture. Proximal to mid fibular shaft fracture with 1 shaft width medial displacement.   Electronically Signed   By: Dereck Ligas M.D.   On: 07/04/2014 18:58   Dg Ankle Complete Left  07/04/2014   CLINICAL DATA:  Left ankle swelling.  EXAM: LEFT ANKLE COMPLETE - 3+ VIEW  COMPARISON:  None.  FINDINGS: An oblique calcaneal fracture is identified extending from the subtalar joint to the inferior surface.  No other fracture, subluxation or dislocation identified.  Mild degenerative changes at the tibiotalar joint noted.  Heavy vascular calcifications are identified.  IMPRESSION: Intraarticular calcaneal fracture extending from the subtalar joint to the inferior surface.   Electronically Signed   By: Hassan Rowan M.D.   On: 07/04/2014 19:02   Ct Head Wo Contrast  07/04/2014   CLINICAL DATA:  Fall.  Found down.  Confused.  EXAM: CT HEAD WITHOUT CONTRAST  CT CERVICAL SPINE WITHOUT CONTRAST  TECHNIQUE: Multidetector CT imaging of the head and cervical spine was performed following the standard protocol without intravenous contrast. Multiplanar CT image reconstructions of the cervical spine were also generated.  COMPARISON:  CT head and cervical spine 04/22/2012  FINDINGS: CT HEAD FINDINGS  Stable extensive encephalomalacia in the parietal lobes bilaterally and in the left occipital lobe, suggestive of prior infarctions. Stable chronic hypodensity in the periventricular white matter bilaterally. Remote basal ganglia infarcts bilaterally are unchanged. Diffuse cerebral volume loss noted. Ventricles are stable in size. Negative for intra or extra-axial hemorrhage, evidence of hydrocephalus, or CT findings of acute cortically based infarction.  Extensive atherosclerotic calcification in the  visualized vertebral arteries and the visualized intracranial internal carotid arteries. Skull is intact. Visualized paranasal sinuses and mastoid air cells are clear.  CT CERVICAL SPINE FINDINGS  Cervical spine is normally aligned from the skullbase through the cervicothoracic junction. Moderate disc height narrowing at C4-5, C5-6, and C6-7 is without significant change. Posterior osseous spurring and anterior osteophyte formation is seen at each of these levels. There is also mild posterior osseous spurring at C3-C4. Facet joint degenerative changes are noted bilaterally at C3-C4 and C4-C5.  Incomplete fusion of the posterior arch of C1 is again noted.  Moderate osseous neural foraminal narrowing on the right at C3-C4. Mild bilateral neural foraminal narrowing at C4-C5. Mild bilateral neural foraminal narrowing at C5-C6. Mild bilateral neural foraminal narrowing at C6-C7.  IMPRESSION: 1. No acute intracranial abnormality identified. Extensive chronic remote cerebral infarcts and chronic white matter ischemic changes. 2. Chronic cervical spondylosis. 3. No evidence of acute bony injury to the cervical spine.   Electronically Signed   By: Curlene Dolphin M.D.   On: 07/04/2014 18:24   Ct Cervical Spine Wo Contrast  07/04/2014   CLINICAL DATA:  Patient found down.  Confusion.  EXAM: CT CERVICAL SPINE WITHOUT CONTRAST  TECHNIQUE: Multidetector CT imaging of the cervical spine was performed without intravenous contrast. Multiplanar CT image reconstructions were also generated.  COMPARISON:  Cervical spine CT scan 04/22/2012.  FINDINGS: No acute displaced fractures in the cervical spine. Severe multilevel degenerative disc disease again noted, most pronounced at C6-C7. Moderate multilevel facet arthropathy. Alignment is anatomic. Prevertebral soft tissues are normal. Incidental note is again made of incomplete fusion of the posterior arch of C1 (normal variant). Incidental imaging of the upper thorax is unremarkable.   IMPRESSION: 1. No acute abnormality of the cervical spine. 2. Severe multilevel degenerative disc disease and cervical spondylosis again noted, as above.   Electronically Signed   By: Vinnie Langton M.D.   On: 07/04/2014 18:18    ECG:  1705 reviewed, sinus rhythm HR 96, diffuse ST depressions which are new, previous ecg normal sinus rhythm with no acute ischemic ST-T changes.  Impression: 77F hx of Atrial fibrillation, DM, HTN, CVA, severe dementia presents after being found down for unknown period of time with  left tib/fib fracture and left calcaneus fracture.  At this time , it is unclear what the circumstances were surrounding her fall and being down.  She has ST depressions on her ekg.  She has negative initial troponin.  She denies any chest pain but is demented. Suspect that her ecg changes may most likely represent demand ischemia likely multifactorial.  She does have known moderate stenosis on last echo in 2013.  Recommendations: -No clear cardiac etiology for presentation. No indications for emergent invasive ischemic evaluation. -Agree with cardiac monitoring in step down unit. -Cycle cardiac enzymes -Repeat ekg prn with symptoms or noted rhythm changes -Consider repeat TTE to assess LV function, wall motion, and valvular disease -Would continue home beta blocker -Volume resuscitation with IVFs   Thank you for this consult.  We will follow up on initial test and make further recommendations as indicated.  Please do not hesitate to call with any questions.

## 2014-07-04 NOTE — ED Notes (Signed)
To ED from home via [facility], found on ground outside, unknown how long, swelling and deformity to left leg, hx of dementia, baseline neuro unknown, VSS

## 2014-07-04 NOTE — ED Notes (Signed)
CBG 299 

## 2014-07-04 NOTE — ED Notes (Signed)
Patient transported to CT 

## 2014-07-04 NOTE — Consult Note (Signed)
Reason for Consult:  Left tib/fib fracture and left calcaneus fracture Referring Physician: EDP  Lorraine Tran is an 80 y.o. female.  HPI:   80 yo demented female who was found down outside of her home.  She was down for an unknown period of time.  She was brought to the [facility] ED via EMS.  She is awake and follows some commands.  Her daughter is at the bedside.  From an orthopedic standpoint, she was found to haw a left tibia/fibula fracture and a left calcaneus fracture.  She is being admitted by [facility] to likely stepdown given her multiple medical problems and due to likely dehydration from being down.  She has also been on the blood thinner Eliquis.  Past Medical History  Diagnosis Date  . Diabetes mellitus   . Dementia   . Stroke   . Atrial fibrillation 06/06/2012    Eliquis started 8/13  . Aortic stenosis     a. Echo 6/13:  mild LVH, EF 65-70%, LV vigorous, mod AS, mean 26 mmHg, MAC, mild MS, mean 4 mmHg, mild LAE, PASP 48    Past Surgical History  Procedure Laterality Date  . Abdominal hysterectomy    . Cholecystectomy      No family history on file.  Social History:  reports that she has never smoked. She has never used smokeless tobacco. She reports that she does not drink alcohol or use illicit drugs.  Allergies:  Allergies  Allergen Reactions  . Penicillins     Medications: I have reviewed the patient's current medications.  Results for orders placed during the hospital encounter of 07/04/14 (from the past 48 hour(s))  PROTIME-INR     Status: Abnormal   Collection Time    07/04/14  5:15 PM      Result Value Ref Range   Prothrombin Time 16.7 (*) 11.6 - 15.2 seconds   INR 1.35  0.00 - 1.49  CBC WITH DIFFERENTIAL     Status: Abnormal   Collection Time    07/04/14  5:15 PM      Result Value Ref Range   WBC 20.1 (*) 4.0 - 10.5 K/uL   RBC 3.83 (*) 3.87 - 5.11 MIL/uL   Hemoglobin 11.3 (*) 12.0 - 15.0 g/dL   HCT 16.1 (*) 09.6 - 04.5 %   MCV 88.0   78.0 - 100.0 fL   MCH 29.5  26.0 - 34.0 pg   MCHC 33.5  30.0 - 36.0 g/dL   RDW 40.9  81.1 - 91.4 %   Platelets 261  150 - 400 K/uL   Neutrophils Relative % 90 (*) 43 - 77 %   Neutro Abs 18.0 (*) 1.7 - 7.7 K/uL   Lymphocytes Relative 3 (*) 12 - 46 %   Lymphs Abs 0.7  0.7 - 4.0 K/uL   Monocytes Relative 7  3 - 12 %   Monocytes Absolute 1.4 (*) 0.1 - 1.0 K/uL   Eosinophils Relative 0  0 - 5 %   Eosinophils Absolute 0.0  0.0 - 0.7 K/uL   Basophils Relative 0  0 - 1 %   Basophils Absolute 0.0  0.0 - 0.1 K/uL  CK     Status: Abnormal   Collection Time    07/04/14  5:15 PM      Result Value Ref Range   Total CK 605 (*) 7 - 177 U/L  HEPATIC FUNCTION PANEL     Status: None   Collection Time  07/04/14  5:15 PM      Result Value Ref Range   Total Protein 6.0  6.0 - 8.3 g/dL   Albumin 3.5  3.5 - 5.2 g/dL   AST 29  0 - 37 U/L   ALT 21  0 - 35 U/L   Alkaline Phosphatase 47  39 - 117 U/L   Total Bilirubin 0.8  0.3 - 1.2 mg/dL   Bilirubin, Direct <1.9  0.0 - 0.3 mg/dL   Indirect Bilirubin NOT CALCULATED  0.3 - 0.9 mg/dL  CBG MONITORING, ED     Status: Abnormal   Collection Time    07/04/14  5:16 PM      Result Value Ref Range   Glucose-Capillary 299 (*) 70 - 99 mg/dL   Comment 1 Documented in Chart     Comment 2 Notify RN    Rosezena Sensor, ED     Status: None   Collection Time    07/04/14  5:20 PM      Result Value Ref Range   Troponin i, poc 0.04  0.00 - 0.08 ng/mL   Comment 3            Comment: Due to the release kinetics of cTnI,     a negative result within the first hours     of the onset of symptoms does not rule out     myocardial infarction with certainty.     If myocardial infarction is still suspected,     repeat the test at appropriate intervals.  I-STAT CHEM 8, ED     Status: Abnormal   Collection Time    07/04/14  5:22 PM      Result Value Ref Range   Sodium 139  137 - 147 mEq/L   Potassium 4.2  3.7 - 5.3 mEq/L   Chloride 106  96 - 112 mEq/L   BUN 24 (*)  6 - 23 mg/dL   Creatinine, Ser 1.47  0.50 - 1.10 mg/dL   Glucose, Bld 829 (*) 70 - 99 mg/dL   Calcium, Ion 5.62  1.30 - 1.30 mmol/L   TCO2 19  0 - 100 mmol/L   Hemoglobin 11.9 (*) 12.0 - 15.0 g/dL   HCT 86.5 (*) 78.4 - 69.6 %  I-STAT CG4 LACTIC ACID, ED     Status: Abnormal   Collection Time    07/04/14  5:26 PM      Result Value Ref Range   Lactic Acid, Venous 4.60 (*) 0.5 - 2.2 mmol/L    Dg Chest 1 View  07/04/2014   CLINICAL DATA:  Found down.  Altered mental status.  EXAM: CHEST - 1 VIEW  COMPARISON:  Chest x-ray 04/22/2012.  FINDINGS: Lung volumes are low. No consolidative airspace disease. No pleural effusions. No pneumothorax. No pulmonary nodule or mass noted. Pulmonary vasculature and the cardiomediastinal silhouette are within normal limits. Atherosclerosis in the thoracic aorta. Mild bilateral apical pleural parenchymal thickening, presumably post infectious or inflammatory scarring (unchanged).  IMPRESSION: 1. Low lung volumes without radiographic evidence of acute cardiopulmonary disease. 2. Atherosclerosis.   Electronically Signed   By: Trudie Reed M.D.   On: 07/04/2014 18:58   Dg Thoracic Spine 2 View  07/04/2014   CLINICAL DATA:  Patient fell.  Altered mental status.  EXAM: THORACIC SPINE - 2 VIEW  COMPARISON:  Chest x-rays dated 07/04/2014 and 04/22/2012  FINDINGS: There is a very slight anterior wedge deformity of the superior endplate of T4, age indeterminate. There is diffuse  osteopenia. T1 is not well visualized on the available images.  Heart size is normal.  Suggestion of a moderate hiatal hernia.  IMPRESSION: Slight compression deformity of the superior endplate of T4, age indeterminate. T1 is not well visualized on the lateral view.   Electronically Signed   By: Geanie Cooley M.D.   On: 07/04/2014 19:02   Dg Lumbar Spine Complete  07/04/2014   CLINICAL DATA:  Found down.  Altered mental status.  EXAM: LUMBAR SPINE - COMPLETE 4+ VIEW  COMPARISON:  Lumbar spine MRI  04/23/2012.  FINDINGS: Old compression fracture of L1 with approximately 70% loss of anterior vertebral body height. No new acute displaced fractures or compression type fractures are otherwise noted. Multilevel degenerative disc disease, most severe at L5-S1. Severe multilevel facet arthropathy, most severe at L4-L5 and L5-S1. There is a 5 mm of anterolisthesis of L4 upon L5. 8 mm of anterolisthesis of L5 upon S1. Mild exaggeration of normal lumbar lordosis and mild levoscoliosis in the mid lumbar spine, but alignment is otherwise anatomic, although there is a mild acute kyphosis at the level of T12-L1, related to the chronic compression fracture. Extensive atherosclerosis.  IMPRESSION: 1. No acute findings. 2. Extensive multilevel degenerative disc disease, lumbar spondylosis, mild lumbar levoscoliosis, and old compression fracture at L2 are similar to prior examinations, as above.   Electronically Signed   By: Trudie Reed M.D.   On: 07/04/2014 19:04   Dg Pelvis 1-2 Views  07/04/2014   CLINICAL DATA:  Patient found down.  EXAM: PELVIS - 1-2 VIEW  COMPARISON:  Leg radiographs today.  FINDINGS: Diffuse osteopenia. No displaced pelvic fracture. Atherosclerosis. Both hips are externally rotated. No displaced hip fracture is identified. Lumbar spondylosis. Moderate to large amount of stool in the rectosigmoid.  IMPRESSION: No acute osseous abnormality.   Electronically Signed   By: Andreas Newport M.D.   On: 07/04/2014 19:01   Dg Tibia/fibula Left  07/04/2014   CLINICAL DATA:  Altered mental status.  Leg pain.  Found down.  EXAM: LEFT TIBIA AND FIBULA - 2 VIEW  COMPARISON:  None.  FINDINGS: Comminuted proximal tibial diaphysis fracture is present. 7 mm medial displacement of the medial cortical shard. Nonstandard projections are submitted. There also is a proximal to mid fibular shaft fracture with one shaft width medial displacement. Diffuse osteopenia. Atherosclerosis.  IMPRESSION: Study degraded by  nonstandard projections. Mildly displaced comminuted proximal tibial shaft fracture. Proximal to mid fibular shaft fracture with 1 shaft width medial displacement.   Electronically Signed   By: Andreas Newport M.D.   On: 07/04/2014 18:58   Dg Ankle Complete Left  07/04/2014   CLINICAL DATA:  Left ankle swelling.  EXAM: LEFT ANKLE COMPLETE - 3+ VIEW  COMPARISON:  None.  FINDINGS: An oblique calcaneal fracture is identified extending from the subtalar joint to the inferior surface.  No other fracture, subluxation or dislocation identified.  Mild degenerative changes at the tibiotalar joint noted.  Heavy vascular calcifications are identified.  IMPRESSION: Intraarticular calcaneal fracture extending from the subtalar joint to the inferior surface.   Electronically Signed   By: Laveda Abbe M.D.   On: 07/04/2014 19:02   Ct Head Wo Contrast  07/04/2014   CLINICAL DATA:  Fall.  Found down.  Confused.  EXAM: CT HEAD WITHOUT CONTRAST  CT CERVICAL SPINE WITHOUT CONTRAST  TECHNIQUE: Multidetector CT imaging of the head and cervical spine was performed following the standard protocol without intravenous contrast. Multiplanar CT image reconstructions of the cervical  spine were also generated.  COMPARISON:  CT head and cervical spine 04/22/2012  FINDINGS: CT HEAD FINDINGS  Stable extensive encephalomalacia in the parietal lobes bilaterally and in the left occipital lobe, suggestive of prior infarctions. Stable chronic hypodensity in the periventricular white matter bilaterally. Remote basal ganglia infarcts bilaterally are unchanged. Diffuse cerebral volume loss noted. Ventricles are stable in size. Negative for intra or extra-axial hemorrhage, evidence of hydrocephalus, or CT findings of acute cortically based infarction.  Extensive atherosclerotic calcification in the visualized vertebral arteries and the visualized intracranial internal carotid arteries. Skull is intact. Visualized paranasal sinuses and mastoid air cells  are clear.  CT CERVICAL SPINE FINDINGS  Cervical spine is normally aligned from the skullbase through the cervicothoracic junction. Moderate disc height narrowing at C4-5, C5-6, and C6-7 is without significant change. Posterior osseous spurring and anterior osteophyte formation is seen at each of these levels. There is also mild posterior osseous spurring at C3-C4. Facet joint degenerative changes are noted bilaterally at C3-C4 and C4-C5.  Incomplete fusion of the posterior arch of C1 is again noted.  Moderate osseous neural foraminal narrowing on the right at C3-C4. Mild bilateral neural foraminal narrowing at C4-C5. Mild bilateral neural foraminal narrowing at C5-C6. Mild bilateral neural foraminal narrowing at C6-C7.  IMPRESSION: 1. No acute intracranial abnormality identified. Extensive chronic remote cerebral infarcts and chronic white matter ischemic changes. 2. Chronic cervical spondylosis. 3. No evidence of acute bony injury to the cervical spine.   Electronically Signed   By: Britta Mccreedy M.D.   On: 07/04/2014 18:24   Ct Cervical Spine Wo Contrast  07/04/2014   CLINICAL DATA:  Patient found down.  Confusion.  EXAM: CT CERVICAL SPINE WITHOUT CONTRAST  TECHNIQUE: Multidetector CT imaging of the cervical spine was performed without intravenous contrast. Multiplanar CT image reconstructions were also generated.  COMPARISON:  Cervical spine CT scan 04/22/2012.  FINDINGS: No acute displaced fractures in the cervical spine. Severe multilevel degenerative disc disease again noted, most pronounced at C6-C7. Moderate multilevel facet arthropathy. Alignment is anatomic. Prevertebral soft tissues are normal. Incidental note is again made of incomplete fusion of the posterior arch of C1 (normal variant). Incidental imaging of the upper thorax is unremarkable.  IMPRESSION: 1. No acute abnormality of the cervical spine. 2. Severe multilevel degenerative disc disease and cervical spondylosis again noted, as above.    Electronically Signed   By: Trudie Reed M.D.   On: 07/04/2014 18:18    ROS Blood pressure 145/113, pulse 99, temperature 99.5 F (37.5 C), temperature source Rectal, resp. rate 22, SpO2 98.00%. Physical Exam  Musculoskeletal:       Left lower leg: She exhibits bony tenderness, swelling, edema and deformity.       Left foot: She exhibits bony tenderness and swelling.   Her left calf is quite swollen and significantly bruised, but her compartments are all soft. Her left foot is warm and well-perfused  Assessment/Plan: Left tibia/fibula fracture and left calcaneus fracture 1)  Tonight, she will be placed in a well-padded splint and instruction for nursing to elevate her left leg with pillows and place ice on her shin.  I have spoken to her daughter about the recommendation of surgery to place an intramedullary rod/nail to stabilize her tibia once she is cleared for surgery medically.  She is certainly at high risk for multiple complications from both operative and non-operative treatment.  Non-operative treatment would be eventual long-leg cast placement.  With this comes a high non-union risk given  her osteopenic bone and her diabetes.  Also, this could lead to significant soft-tissue issues.  Of course, surgery is not without the risks of infection, bleeding, and associated risks related to anesthesia, etc. 2)  Her daughter understands the risks and benefits of treatment of her orthopedic injuries and understands we will wait for her to be optimized medically before a decision is made for surgery, again, pending medical and cardiac clearance.  The earliest surgery would be scheduled would be tomorrow afternoon vs Sunday.  Kathryne Hitch 07/04/2014, 8:22 PM

## 2014-07-04 NOTE — ED Notes (Signed)
Attempted report 

## 2014-07-04 NOTE — ED Provider Notes (Signed)
Patient seen/examined in the Emergency Department in conjunction with Resident Physician Provider Stengel Patient found down at home, unknown down time.  Pt is confused, and unable to give any history Exam : sleeping but arousable.  She has dirt throughout her body including in her mouth.  She has tenderness/ecchymosis to left LE but distal pulses intact Plan: will need admission I have spoken to social work about this patient to assist with evaluation    Joya Gaskins, MD 07/04/14 1742

## 2014-07-04 NOTE — ED Provider Notes (Signed)
CSN: [id number]     Arrival date & time 07/04/14  1649 History   First MD Initiated Contact with Patient 07/04/14 1706     Chief Complaint  Patient presents with  . Fall    HPI  Pt with DM, dementia, a fib on Eliquis found down by the side of her house near a porch by daughter.  Lives with husband who has dementia as well.  Pt does not remember why she was on the ground or if she fell.  Husband per EMS does not know how long she was outside for.  Pt endorses back and left leg pain.  Swelling noted by EMS to left leg with bruising.  Pt was covered in dirt and insects when EMS arrived. BP ranged from SBP 80 to 170.  FSBG 220.  Pt can not contribute much to history. Denies chest pain or dyspnea.    Past Medical History  Diagnosis Date  . Diabetes mellitus   . Dementia   . Stroke   . Atrial fibrillation 06/06/2012    Eliquis started 8/13  . Aortic stenosis     a. Echo 6/13:  mild LVH, EF 65-70%, LV vigorous, mod AS, mean 26 mmHg, MAC, mild MS, mean 4 mmHg, mild LAE, PASP 48   Past Surgical History  Procedure Laterality Date  . Abdominal hysterectomy    . Cholecystectomy     No family history on file. History  Substance Use Topics  . Smoking status: Never Smoker   . Smokeless tobacco: Never Used  . Alcohol Use: No   OB History   Grav Para Term Preterm Abortions TAB SAB Ect Mult Living                 Review of Systems  Unable to perform ROS: Dementia    Allergies  Penicillins  Home Medications   Prior to Admission medications   Medication Sig Start Date End Date Taking? Authorizing Provider  apixaban (ELIQUIS) 2.5 MG TABS tablet TAKE 1 TABLET BY MOUTH TWICE DAILY 10/03/13   Duke Salvia, MD  glipiZIDE (GLUCOTROL) 10 MG tablet Take 10 mg by mouth 2 (two) times daily before a meal.    Historical Provider, MD  metoprolol tartrate (LOPRESSOR) 25 MG tablet TAKE 1 TABLET BY MOUTH TWICE DAILY 10/01/13   Brooke O Edmisten, PA-C  sitaGLIPtin (JANUVIA) 100 MG tablet Take 100 mg  by mouth daily.    Historical Provider, MD   BP 136/48  Pulse 100  Temp(Src) 98.3 F (36.8 C) (Oral)  Resp 18  SpO2 100% Physical Exam  Nursing note and vitals reviewed. Constitutional: No distress.  Elderly, foul smelling, covered in dirt with ants  HENT:  Head: Normocephalic.  Nose: Nose normal.  Very small abrasion to forehead,  Eyes: Conjunctivae are normal. Pupils are equal, round, and reactive to light.  No conj pallor  Neck: Normal range of motion. Neck supple. No tracheal deviation present.  Cardiovascular: Normal rate and regular rhythm.   Murmur (3/6 SEM USB) heard. Intact radial and DP pulses, equal  Pulmonary/Chest: Effort normal and breath sounds normal. No respiratory distress. She has no wheezes. She has no rales. She exhibits no tenderness.  Abdominal: Soft. Bowel sounds are normal. She exhibits no distension and no mass. There is no tenderness.  Musculoskeletal: Normal range of motion.  Left lower leg ecchymoses with edema and TTP, no warmth.  FROM of lower extrmeities.  TTP at midline lumbar, no step offs.  Pelvis stable without pain  Neurological: She is alert.  Oriented to person and place and situation not time.  Normal strength of all extremities. No drift.  CN3-12 tested and intact. Toes flexor. Normal sensation  Skin: Skin is warm and dry. No rash noted. She is not diaphoretic.  Ecchymoses to left lower leg, weeping, no spreading erythema    ED Course  Procedures (including critical care time) Labs Review Labs Reviewed  PROTIME-INR - Abnormal; Notable for the following:    Prothrombin Time 16.7 (*)    All other components within normal limits  CBC WITH DIFFERENTIAL - Abnormal; Notable for the following:    WBC 20.1 (*)    RBC 3.83 (*)    Hemoglobin 11.3 (*)    HCT 33.7 (*)    Neutrophils Relative % 90 (*)    Neutro Abs 18.0 (*)    Lymphocytes Relative 3 (*)    Monocytes Absolute 1.4 (*)    All other components within normal limits  CK -  Abnormal; Notable for the following:    Total CK 605 (*)    All other components within normal limits  CBG MONITORING, ED - Abnormal; Notable for the following:    Glucose-Capillary 299 (*)    All other components within normal limits  I-STAT CHEM 8, ED - Abnormal; Notable for the following:    BUN 24 (*)    Glucose, Bld 353 (*)    Hemoglobin 11.9 (*)    HCT 35.0 (*)    All other components within normal limits  I-STAT CG4 LACTIC ACID, ED - Abnormal; Notable for the following:    Lactic Acid, Venous 4.60 (*)    All other components within normal limits  HEPATIC FUNCTION PANEL  URINALYSIS, ROUTINE W REFLEX MICROSCOPIC  I-STAT TROPOININ, ED    Imaging Review Dg Chest 1 View  07/04/2014   CLINICAL DATA:  Found down.  Altered mental status.  EXAM: CHEST - 1 VIEW  COMPARISON:  Chest x-ray 04/22/2012.  FINDINGS: Lung volumes are low. No consolidative airspace disease. No pleural effusions. No pneumothorax. No pulmonary nodule or mass noted. Pulmonary vasculature and the cardiomediastinal silhouette are within normal limits. Atherosclerosis in the thoracic aorta. Mild bilateral apical pleural parenchymal thickening, presumably post infectious or inflammatory scarring (unchanged).  IMPRESSION: 1. Low lung volumes without radiographic evidence of acute cardiopulmonary disease. 2. Atherosclerosis.   Electronically Signed   By: Trudie Reed M.D.   On: 07/04/2014 18:58   Dg Thoracic Spine 2 View  07/04/2014   CLINICAL DATA:  Patient fell.  Altered mental status.  EXAM: THORACIC SPINE - 2 VIEW  COMPARISON:  Chest x-rays dated 07/04/2014 and 04/22/2012  FINDINGS: There is a very slight anterior wedge deformity of the superior endplate of T4, age indeterminate. There is diffuse osteopenia. T1 is not well visualized on the available images.  Heart size is normal.  Suggestion of a moderate hiatal hernia.  IMPRESSION: Slight compression deformity of the superior endplate of T4, age indeterminate. T1 is  not well visualized on the lateral view.   Electronically Signed   By: Geanie Cooley M.D.   On: 07/04/2014 19:02   Dg Lumbar Spine Complete  07/04/2014   CLINICAL DATA:  Found down.  Altered mental status.  EXAM: LUMBAR SPINE - COMPLETE 4+ VIEW  COMPARISON:  Lumbar spine MRI 04/23/2012.  FINDINGS: Old compression fracture of L1 with approximately 70% loss of anterior vertebral body height. No new acute displaced fractures or compression type fractures are otherwise noted. Multilevel degenerative  disc disease, most severe at L5-S1. Severe multilevel facet arthropathy, most severe at L4-L5 and L5-S1. There is a 5 mm of anterolisthesis of L4 upon L5. 8 mm of anterolisthesis of L5 upon S1. Mild exaggeration of normal lumbar lordosis and mild levoscoliosis in the mid lumbar spine, but alignment is otherwise anatomic, although there is a mild acute kyphosis at the level of T12-L1, related to the chronic compression fracture. Extensive atherosclerosis.  IMPRESSION: 1. No acute findings. 2. Extensive multilevel degenerative disc disease, lumbar spondylosis, mild lumbar levoscoliosis, and old compression fracture at L2 are similar to prior examinations, as above.   Electronically Signed   By: Trudie Reed M.D.   On: 07/04/2014 19:04   Dg Pelvis 1-2 Views  07/04/2014   CLINICAL DATA:  Patient found down.  EXAM: PELVIS - 1-2 VIEW  COMPARISON:  Leg radiographs today.  FINDINGS: Diffuse osteopenia. No displaced pelvic fracture. Atherosclerosis. Both hips are externally rotated. No displaced hip fracture is identified. Lumbar spondylosis. Moderate to large amount of stool in the rectosigmoid.  IMPRESSION: No acute osseous abnormality.   Electronically Signed   By: Andreas Newport M.D.   On: 07/04/2014 19:01   Dg Tibia/fibula Left  07/04/2014   CLINICAL DATA:  Altered mental status.  Leg pain.  Found down.  EXAM: LEFT TIBIA AND FIBULA - 2 VIEW  COMPARISON:  None.  FINDINGS: Comminuted proximal tibial diaphysis fracture  is present. 7 mm medial displacement of the medial cortical shard. Nonstandard projections are submitted. There also is a proximal to mid fibular shaft fracture with one shaft width medial displacement. Diffuse osteopenia. Atherosclerosis.  IMPRESSION: Study degraded by nonstandard projections. Mildly displaced comminuted proximal tibial shaft fracture. Proximal to mid fibular shaft fracture with 1 shaft width medial displacement.   Electronically Signed   By: Andreas Newport M.D.   On: 07/04/2014 18:58   Dg Ankle Complete Left  07/04/2014   CLINICAL DATA:  Left ankle swelling.  EXAM: LEFT ANKLE COMPLETE - 3+ VIEW  COMPARISON:  None.  FINDINGS: An oblique calcaneal fracture is identified extending from the subtalar joint to the inferior surface.  No other fracture, subluxation or dislocation identified.  Mild degenerative changes at the tibiotalar joint noted.  Heavy vascular calcifications are identified.  IMPRESSION: Intraarticular calcaneal fracture extending from the subtalar joint to the inferior surface.   Electronically Signed   By: Laveda Abbe M.D.   On: 07/04/2014 19:02   Ct Head Wo Contrast  07/04/2014   CLINICAL DATA:  Fall.  Found down.  Confused.  EXAM: CT HEAD WITHOUT CONTRAST  CT CERVICAL SPINE WITHOUT CONTRAST  TECHNIQUE: Multidetector CT imaging of the head and cervical spine was performed following the standard protocol without intravenous contrast. Multiplanar CT image reconstructions of the cervical spine were also generated.  COMPARISON:  CT head and cervical spine 04/22/2012  FINDINGS: CT HEAD FINDINGS  Stable extensive encephalomalacia in the parietal lobes bilaterally and in the left occipital lobe, suggestive of prior infarctions. Stable chronic hypodensity in the periventricular white matter bilaterally. Remote basal ganglia infarcts bilaterally are unchanged. Diffuse cerebral volume loss noted. Ventricles are stable in size. Negative for intra or extra-axial hemorrhage, evidence of  hydrocephalus, or CT findings of acute cortically based infarction.  Extensive atherosclerotic calcification in the visualized vertebral arteries and the visualized intracranial internal carotid arteries. Skull is intact. Visualized paranasal sinuses and mastoid air cells are clear.  CT CERVICAL SPINE FINDINGS  Cervical spine is normally aligned from the skullbase through the  cervicothoracic junction. Moderate disc height narrowing at C4-5, C5-6, and C6-7 is without significant change. Posterior osseous spurring and anterior osteophyte formation is seen at each of these levels. There is also mild posterior osseous spurring at C3-C4. Facet joint degenerative changes are noted bilaterally at C3-C4 and C4-C5.  Incomplete fusion of the posterior arch of C1 is again noted.  Moderate osseous neural foraminal narrowing on the right at C3-C4. Mild bilateral neural foraminal narrowing at C4-C5. Mild bilateral neural foraminal narrowing at C5-C6. Mild bilateral neural foraminal narrowing at C6-C7.  IMPRESSION: 1. No acute intracranial abnormality identified. Extensive chronic remote cerebral infarcts and chronic white matter ischemic changes. 2. Chronic cervical spondylosis. 3. No evidence of acute bony injury to the cervical spine.   Electronically Signed   By: Britta Mccreedy M.D.   On: 07/04/2014 18:24   Ct Cervical Spine Wo Contrast  07/04/2014   CLINICAL DATA:  Patient found down.  Confusion.  EXAM: CT CERVICAL SPINE WITHOUT CONTRAST  TECHNIQUE: Multidetector CT imaging of the cervical spine was performed without intravenous contrast. Multiplanar CT image reconstructions were also generated.  COMPARISON:  Cervical spine CT scan 04/22/2012.  FINDINGS: No acute displaced fractures in the cervical spine. Severe multilevel degenerative disc disease again noted, most pronounced at C6-C7. Moderate multilevel facet arthropathy. Alignment is anatomic. Prevertebral soft tissues are normal. Incidental note is again made of  incomplete fusion of the posterior arch of C1 (normal variant). Incidental imaging of the upper thorax is unremarkable.  IMPRESSION: 1. No acute abnormality of the cervical spine. 2. Severe multilevel degenerative disc disease and cervical spondylosis again noted, as above.   Electronically Signed   By: Trudie Reed M.D.   On: 07/04/2014 18:18     EKG Interpretation   Date/Time:  Friday July 04 2014 17:05:41 EDT Ventricular Rate:  96 PR Interval:  145 QRS Duration: 88 QT Interval:  364 QTC Calculation: 460 R Axis:   70 Text Interpretation:  Sinus rhythm Consider left atrial enlargement Repol  abnrm, severe global ischemia (LM/MVD) changed from prior.   Abnormal ekg  Confirmed by Bebe Shaggy  MD, Dorinda Hill ([id number]) on 07/04/2014 5:13:11 PM      MDM   Final diagnosis: Left tibial fibula fracture, closed, initial encounter Left calcaneal fracture, closed, initial encounter Fall Hyperglycemia  Lactic acidosis Dehydration Elevated CK level Anemia  Anticoagulated, may have syncopized (aortic stenosis) or injured leg causing mechanical fall. Unknown how long on ground.  At baseline per daughter.  Broad workup initiated. VSS EKG with diffuse ST depressions and no reciprocal changes. Trop negative. May be demand ischemia related from hypovolemia.  CT head negative. UA pending.   CK mildly elevated, lactic acid 4. Hydrating with IVF judiciously.   SW consulted given obvious lack of proper home care structure and oversight.  7:14 PM XRs confirm left tib fib fx.  Sensation intact, DP pulses equal. Pain is not out of proportion. Motor function intact. Doubt compartment syndrome.  Ortho consulted. Splinted  Admit to hospitalist.    Sofie Rower, MD 07/05/14 [phone]

## 2014-07-04 NOTE — ED Notes (Signed)
Patient transported to MRI 

## 2014-07-04 NOTE — Progress Notes (Signed)
Patient transferred from ER via stretcher on tele, no family at bedside. Patient oriented to unit and room, instructed on callbell and placed at side. Toileting offered but refused. Bed alarm on. Will continue to monitor.

## 2014-07-04 NOTE — ED Notes (Signed)
Back from Xray.

## 2014-07-04 NOTE — Progress Notes (Signed)
Orthopedic Tech Progress Note Patient Details:  Lorraine Tran [facility] [birth year] [id number]  Ortho Devices Type of Ortho Device: Ace wrap;Post (long leg) splint Ortho Device/Splint Location: LLE Ortho Device/Splint Interventions: Ordered;Application   [name] 07/04/2014, 8:18 PM

## 2014-07-05 DIAGNOSIS — F0390 Unspecified dementia without behavioral disturbance: Secondary | ICD-10-CM

## 2014-07-05 DIAGNOSIS — E1165 Type 2 diabetes mellitus with hyperglycemia: Secondary | ICD-10-CM

## 2014-07-05 DIAGNOSIS — Z01810 Encounter for preprocedural cardiovascular examination: Secondary | ICD-10-CM

## 2014-07-05 DIAGNOSIS — IMO0001 Reserved for inherently not codable concepts without codable children: Secondary | ICD-10-CM

## 2014-07-05 DIAGNOSIS — R9431 Abnormal electrocardiogram [ECG] [EKG]: Secondary | ICD-10-CM

## 2014-07-05 DIAGNOSIS — I10 Essential (primary) hypertension: Secondary | ICD-10-CM

## 2014-07-05 LAB — URINALYSIS, ROUTINE W REFLEX MICROSCOPIC
Bilirubin Urine: NEGATIVE
Glucose, UA: 500 mg/dL — AB
Hgb urine dipstick: NEGATIVE
Ketones, ur: 15 mg/dL — AB
LEUKOCYTES UA: NEGATIVE
Nitrite: NEGATIVE
PH: 5 (ref 5.0–8.0)
PROTEIN: NEGATIVE mg/dL
Specific Gravity, Urine: 1.021 (ref 1.005–1.030)
Urobilinogen, UA: 0.2 mg/dL (ref 0.0–1.0)

## 2014-07-05 LAB — RETICULOCYTES
RBC.: 2.85 MIL/uL — ABNORMAL LOW (ref 3.87–5.11)
RETIC COUNT ABSOLUTE: 42.8 10*3/uL (ref 19.0–186.0)
Retic Ct Pct: 1.5 % (ref 0.4–3.1)

## 2014-07-05 LAB — HEMOGLOBIN A1C
Hgb A1c MFr Bld: 6.7 % — ABNORMAL HIGH (ref <5.7)
MEAN PLASMA GLUCOSE: 146 mg/dL — AB (ref <117)

## 2014-07-05 LAB — IRON AND TIBC
IRON: 14 ug/dL — AB (ref 42–135)
SATURATION RATIOS: 6 % — AB (ref 20–55)
TIBC: 224 ug/dL — ABNORMAL LOW (ref 250–470)
UIBC: 210 ug/dL (ref 125–400)

## 2014-07-05 LAB — COMPREHENSIVE METABOLIC PANEL
ALT: 18 U/L (ref 0–35)
AST: 26 U/L (ref 0–37)
Albumin: 2.7 g/dL — ABNORMAL LOW (ref 3.5–5.2)
Alkaline Phosphatase: 36 U/L — ABNORMAL LOW (ref 39–117)
Anion gap: 13 (ref 5–15)
BUN: 27 mg/dL — ABNORMAL HIGH (ref 6–23)
CO2: 19 mEq/L (ref 19–32)
Calcium: 8.1 mg/dL — ABNORMAL LOW (ref 8.4–10.5)
Chloride: 113 mEq/L — ABNORMAL HIGH (ref 96–112)
Creatinine, Ser: 0.88 mg/dL (ref 0.50–1.10)
GFR calc Af Amer: 66 mL/min — ABNORMAL LOW (ref >90)
GFR calc non Af Amer: 57 mL/min — ABNORMAL LOW (ref >90)
Glucose, Bld: 207 mg/dL — ABNORMAL HIGH (ref 70–99)
Potassium: 4.4 mEq/L (ref 3.7–5.3)
SODIUM: 145 meq/L (ref 137–147)
TOTAL PROTEIN: 5 g/dL — AB (ref 6.0–8.3)
Total Bilirubin: 0.4 mg/dL (ref 0.3–1.2)

## 2014-07-05 LAB — GLUCOSE, CAPILLARY
GLUCOSE-CAPILLARY: 157 mg/dL — AB (ref 70–99)
GLUCOSE-CAPILLARY: 272 mg/dL — AB (ref 70–99)
Glucose-Capillary: 187 mg/dL — ABNORMAL HIGH (ref 70–99)
Glucose-Capillary: 178 mg/dL — ABNORMAL HIGH (ref 70–99)
Glucose-Capillary: 191 mg/dL — ABNORMAL HIGH (ref 70–99)

## 2014-07-05 LAB — FERRITIN: FERRITIN: 135 ng/mL (ref 10–291)

## 2014-07-05 LAB — CBC WITH DIFFERENTIAL/PLATELET
Basophils Absolute: 0 10*3/uL (ref 0.0–0.1)
Basophils Relative: 0 % (ref 0–1)
EOS ABS: 0 10*3/uL (ref 0.0–0.7)
Eosinophils Relative: 0 % (ref 0–5)
HCT: 26.3 % — ABNORMAL LOW (ref 36.0–46.0)
HEMOGLOBIN: 8.9 g/dL — AB (ref 12.0–15.0)
LYMPHS ABS: 1.3 10*3/uL (ref 0.7–4.0)
LYMPHS PCT: 10 % — AB (ref 12–46)
MCH: 29.4 pg (ref 26.0–34.0)
MCHC: 33.8 g/dL (ref 30.0–36.0)
MCV: 86.8 fL (ref 78.0–100.0)
MONOS PCT: 10 % (ref 3–12)
Monocytes Absolute: 1.3 10*3/uL — ABNORMAL HIGH (ref 0.1–1.0)
NEUTROS PCT: 80 % — AB (ref 43–77)
Neutro Abs: 10.9 10*3/uL — ABNORMAL HIGH (ref 1.7–7.7)
Platelets: 212 10*3/uL (ref 150–400)
RBC: 3.03 MIL/uL — AB (ref 3.87–5.11)
RDW: 14.1 % (ref 11.5–15.5)
WBC: 13.5 10*3/uL — ABNORMAL HIGH (ref 4.0–10.5)

## 2014-07-05 LAB — CK: Total CK: 678 U/L — ABNORMAL HIGH (ref 7–177)

## 2014-07-05 LAB — MRSA PCR SCREENING: MRSA by PCR: NEGATIVE

## 2014-07-05 LAB — LACTIC ACID, PLASMA: LACTIC ACID, VENOUS: 1.6 mmol/L (ref 0.5–2.2)

## 2014-07-05 LAB — TROPONIN I: Troponin I: <0.3 ng/mL (ref <0.30)

## 2014-07-05 LAB — VITAMIN B12: Vitamin B-12: 288 pg/mL (ref 211–911)

## 2014-07-05 LAB — FOLATE: Folate: >20 ng/mL

## 2014-07-05 MED ORDER — METOPROLOL TARTRATE 25 MG PO TABS
25.0000 mg | ORAL_TABLET | Freq: Two times a day (BID) | ORAL | Status: DC
  Administered 2014-07-05 – 2014-07-09 (×9): 25 mg via ORAL
  Filled 2014-07-05 (×11): qty 1

## 2014-07-05 MED ORDER — CHLORHEXIDINE GLUCONATE 0.12 % MT SOLN
15.0000 mL | Freq: Two times a day (BID) | OROMUCOSAL | Status: DC
  Administered 2014-07-05 – 2014-07-08 (×6): 15 mL via OROMUCOSAL
  Filled 2014-07-05 (×9): qty 15

## 2014-07-05 MED ORDER — CETYLPYRIDINIUM CHLORIDE 0.05 % MT LIQD
7.0000 mL | Freq: Two times a day (BID) | OROMUCOSAL | Status: DC
  Administered 2014-07-05 – 2014-07-09 (×8): 7 mL via OROMUCOSAL

## 2014-07-05 MED ORDER — ACETAMINOPHEN 325 MG PO TABS
650.0000 mg | ORAL_TABLET | Freq: Four times a day (QID) | ORAL | Status: DC | PRN
  Administered 2014-07-08 (×2): 650 mg via ORAL
  Filled 2014-07-05 (×2): qty 2

## 2014-07-05 MED ORDER — ONDANSETRON HCL 4 MG/2ML IJ SOLN: 4.0000 mg | Freq: Four times a day (QID) | INTRAMUSCULAR | Status: DC | PRN

## 2014-07-05 MED ORDER — INSULIN GLARGINE 100 UNIT/ML ~~LOC~~ SOLN
10.0000 [IU] | Freq: Every day | SUBCUTANEOUS | Status: DC
  Administered 2014-07-06: 10 [IU] via SUBCUTANEOUS
  Filled 2014-07-05 (×3): qty 0.1

## 2014-07-05 NOTE — Progress Notes (Signed)
Patient ID: Lorraine Tran, female   DOB: February 14, 1922, 80 y.o.   MRN: [id number] I have spoken to her daughter at the bedside in length and have reviewed the Cardiology note.  Her family does feel that it is in her best interest to proceed with surgery tomorrow to stabilize her broken left tibia.

## 2014-07-05 NOTE — Consult Note (Signed)
Reason for Consult: Preop cardiovascular evaluation, AS, PAFib  Requesting Physician: Jomarie Longs  Cardiologist: Graciela Husbands  HPI: This is a 80 y.o. female with a past medical history significant for moderate AS, paroxysmal atrial fibrillation on Eliquis, history of strokes, DM, dementia and a recent fall with left tibial/fibular fracture. History of neurally mediated syncope in remote past, but current event is suspected to be a mechanical fall.  AS appears to be asymptomatic, albeit with a very sedentary lifestyle. Echo 2013 showed moderate AS (mean grad 26 mm Hg, AVA 1.01 cm sq)  Looks comfortable, smiling.  In NSR. Last Eliquis dose 9/10 in evening.  PMHx:  Past Medical History  Diagnosis Date  . Diabetes mellitus   . Dementia   . Stroke   . Atrial fibrillation 06/06/2012    Eliquis started 8/13  . Aortic stenosis     a. Echo 6/13:  mild LVH, EF 65-70%, LV vigorous, mod AS, mean 26 mmHg, MAC, mild MS, mean 4 mmHg, mild LAE, PASP 48   Past Surgical History  Procedure Laterality Date  . Abdominal hysterectomy    . Cholecystectomy      FAMHx: No family history on file.  SOCHx:  reports that she has never smoked. She has never used smokeless tobacco. She reports that she does not drink alcohol or use illicit drugs.  ALLERGIES: Allergies  Allergen Reactions  . Penicillins     ROS: Review of systems is limited due to patient factors, but she denies exertional dizziness/syncope, angina or dyspnea.  HOME MEDICATIONS: Prescriptions prior to admission  Medication Sig Dispense Refill  . apixaban (ELIQUIS) 2.5 MG TABS tablet TAKE 1 TABLET BY MOUTH TWICE DAILY  60 tablet  6  . glipiZIDE (GLUCOTROL) 10 MG tablet Take 10 mg by mouth 2 (two) times daily before a meal.      . metoprolol tartrate (LOPRESSOR) 25 MG tablet TAKE 1 TABLET BY MOUTH TWICE DAILY  60 tablet  11  . sitaGLIPtin (JANUVIA) 100 MG tablet Take 100 mg by mouth daily.        HOSPITAL MEDICATIONS: Prior to  Admission:  Prescriptions prior to admission  Medication Sig Dispense Refill  . apixaban (ELIQUIS) 2.5 MG TABS tablet TAKE 1 TABLET BY MOUTH TWICE DAILY  60 tablet  6  . glipiZIDE (GLUCOTROL) 10 MG tablet Take 10 mg by mouth 2 (two) times daily before a meal.      . metoprolol tartrate (LOPRESSOR) 25 MG tablet TAKE 1 TABLET BY MOUTH TWICE DAILY  60 tablet  11  . sitaGLIPtin (JANUVIA) 100 MG tablet Take 100 mg by mouth daily.       Scheduled: . antiseptic oral rinse  7 mL Mouth Rinse q12n4p  . chlorhexidine  15 mL Mouth Rinse BID  . heparin  5,000 Units Subcutaneous 3 times per day  . insulin aspart  0-9 Units Subcutaneous 6 times per day  . insulin glargine  10 Units Subcutaneous QHS  . metoprolol tartrate  25 mg Oral BID    VITALS: Blood pressure 128/59, pulse 89, temperature 99.1 F (37.3 C), temperature source Axillary, resp. rate 20, height  (1.6 m), weight 128 lb 15.5 oz (58.5 kg), SpO2 97.00%.  PHYSICAL EXAM:  General: Alert, oriented x1, no distress, smiling pleasantly Head: no evidence of trauma, PERRL, EOMI, no exophtalmos or lid lag, no myxedema, no xanthelasma; normal ears, nose and oropharynx Neck: normal jugular venous pulsations and no hepatojugular reflux; brisk carotid pulses without delay, bilateral  loud transmitted carotid bruits Chest: clear to auscultation, no signs of consolidation by percussion or palpation, normal fremitus, symmetrical and full respiratory excursions Cardiovascular: normal position and quality of the apical impulse, regular rhythm, normal first heart sound and soft second heart sound, no rubs or gallops, 3/6 "honking" early to mid peaking aortic systolic murmur Abdomen: no tenderness or distention, no masses by palpation, no abnormal pulsatility or arterial bruits, normal bowel sounds, no hepatosplenomegaly Extremities: bruising both shins, no clubbing, cyanosis;  no edema; 2+ radial, ulnar and brachial pulses bilaterally; 2+ right femoral,  posterior tibial and dorsalis pedis pulses; 2+ left femoral, posterior tibial and dorsalis pedis pulses; no subclavian or femoral bruits Neurological: grossly nonfocal   LABS  CBC  Recent Labs  07/04/14 1715 07/04/14 1722 07/05/14 0350  WBC 20.1*  --  13.5*  NEUTROABS 18.0*  --  10.9*  HGB 11.3* 11.9* 8.9*  HCT 33.7* 35.0* 26.3*  MCV 88.0  --  86.8  PLT 261  --  212   Basic Metabolic Panel  Recent Labs  07/04/14 1722 07/05/14 0350  NA 139 145  K 4.2 4.4  CL 106 113*  CO2  --  19  GLUCOSE 353* 207*  BUN 24* 27*  CREATININE 0.90 0.88  CALCIUM  --  8.1*   Liver Function Tests  Recent Labs  07/04/14 1715 07/05/14 0350  AST 29 26  ALT 21 18  ALKPHOS 47 36*  BILITOT 0.8 0.4  PROT 6.0 5.0*  ALBUMIN 3.5 2.7*   No results found for this basename: LIPASE, AMYLASE,  in the last 72 hours Cardiac Enzymes  Recent Labs  07/04/14 1715 07/04/14 2047 07/05/14 0350 07/05/14 0955  CKTOTAL 605* 640* 678*  --   TROPONINI  --  <0.30 <0.30 <0.30   BNP No components found with this basename: POCBNP,  D-Dimer No results found for this basename: DDIMER,  in the last 72 hours Hemoglobin A1C  Recent Labs  07/04/14 1715  HGBA1C 6.7*   Fasting Lipid Panel No results found for this basename: CHOL, HDL, LDLCALC, TRIG, CHOLHDL, LDLDIRECT,  in the last 72 hours Thyroid Function Tests No results found for this basename: TSH, T4TOTAL, FREET3, T3FREE, THYROIDAB,  in the last 72 hours    IMAGING: Dg Chest 1 View  07/04/2014   CLINICAL DATA:  Found down.  Altered mental status.  EXAM: CHEST - 1 VIEW  COMPARISON:  Chest x-ray 04/22/2012.  FINDINGS: Lung volumes are low. No consolidative airspace disease. No pleural effusions. No pneumothorax. No pulmonary nodule or mass noted. Pulmonary vasculature and the cardiomediastinal silhouette are within normal limits. Atherosclerosis in the thoracic aorta. Mild bilateral apical pleural parenchymal thickening, presumably post infectious  or inflammatory scarring (unchanged).  IMPRESSION: 1. Low lung volumes without radiographic evidence of acute cardiopulmonary disease. 2. Atherosclerosis.   Electronically Signed   By: Trudie Reed M.D.   On: 07/04/2014 18:58   Dg Thoracic Spine 2 View  07/04/2014   CLINICAL DATA:  Patient fell.  Altered mental status.  EXAM: THORACIC SPINE - 2 VIEW  COMPARISON:  Chest x-rays dated 07/04/2014 and 04/22/2012  FINDINGS: There is a very slight anterior wedge deformity of the superior endplate of T4, age indeterminate. There is diffuse osteopenia. T1 is not well visualized on the available images.  Heart size is normal.  Suggestion of a moderate hiatal hernia.  IMPRESSION: Slight compression deformity of the superior endplate of T4, age indeterminate. T1 is not well visualized on the lateral view.  Electronically Signed   By: Geanie Cooley M.D.   On: 07/04/2014 19:02   Dg Lumbar Spine Complete  07/04/2014   CLINICAL DATA:  Found down.  Altered mental status.  EXAM: LUMBAR SPINE - COMPLETE 4+ VIEW  COMPARISON:  Lumbar spine MRI 04/23/2012.  FINDINGS: Old compression fracture of L1 with approximately 70% loss of anterior vertebral body height. No new acute displaced fractures or compression type fractures are otherwise noted. Multilevel degenerative disc disease, most severe at L5-S1. Severe multilevel facet arthropathy, most severe at L4-L5 and L5-S1. There is a 5 mm of anterolisthesis of L4 upon L5. 8 mm of anterolisthesis of L5 upon S1. Mild exaggeration of normal lumbar lordosis and mild levoscoliosis in the mid lumbar spine, but alignment is otherwise anatomic, although there is a mild acute kyphosis at the level of T12-L1, related to the chronic compression fracture. Extensive atherosclerosis.  IMPRESSION: 1. No acute findings. 2. Extensive multilevel degenerative disc disease, lumbar spondylosis, mild lumbar levoscoliosis, and old compression fracture at L2 are similar to prior examinations, as above.    Electronically Signed   By: Trudie Reed M.D.   On: 07/04/2014 19:04   Dg Pelvis 1-2 Views  07/04/2014   CLINICAL DATA:  Patient found down.  EXAM: PELVIS - 1-2 VIEW  COMPARISON:  Leg radiographs today.  FINDINGS: Diffuse osteopenia. No displaced pelvic fracture. Atherosclerosis. Both hips are externally rotated. No displaced hip fracture is identified. Lumbar spondylosis. Moderate to large amount of stool in the rectosigmoid.  IMPRESSION: No acute osseous abnormality.   Electronically Signed   By: Andreas Newport M.D.   On: 07/04/2014 19:01   Dg Tibia/fibula Left  07/04/2014   CLINICAL DATA:  Altered mental status.  Leg pain.  Found down.  EXAM: LEFT TIBIA AND FIBULA - 2 VIEW  COMPARISON:  None.  FINDINGS: Comminuted proximal tibial diaphysis fracture is present. 7 mm medial displacement of the medial cortical shard. Nonstandard projections are submitted. There also is a proximal to mid fibular shaft fracture with one shaft width medial displacement. Diffuse osteopenia. Atherosclerosis.  IMPRESSION: Study degraded by nonstandard projections. Mildly displaced comminuted proximal tibial shaft fracture. Proximal to mid fibular shaft fracture with 1 shaft width medial displacement.   Electronically Signed   By: Andreas Newport M.D.   On: 07/04/2014 18:58   Dg Ankle Complete Left  07/04/2014   CLINICAL DATA:  Left ankle swelling.  EXAM: LEFT ANKLE COMPLETE - 3+ VIEW  COMPARISON:  None.  FINDINGS: An oblique calcaneal fracture is identified extending from the subtalar joint to the inferior surface.  No other fracture, subluxation or dislocation identified.  Mild degenerative changes at the tibiotalar joint noted.  Heavy vascular calcifications are identified.  IMPRESSION: Intraarticular calcaneal fracture extending from the subtalar joint to the inferior surface.   Electronically Signed   By: Laveda Abbe M.D.   On: 07/04/2014 19:02   Ct Head Wo Contrast  07/04/2014   CLINICAL DATA:  Fall.  Found down.   Confused.  EXAM: CT HEAD WITHOUT CONTRAST  CT CERVICAL SPINE WITHOUT CONTRAST  TECHNIQUE: Multidetector CT imaging of the head and cervical spine was performed following the standard protocol without intravenous contrast. Multiplanar CT image reconstructions of the cervical spine were also generated.  COMPARISON:  CT head and cervical spine 04/22/2012  FINDINGS: CT HEAD FINDINGS  Stable extensive encephalomalacia in the parietal lobes bilaterally and in the left occipital lobe, suggestive of prior infarctions. Stable chronic hypodensity in the periventricular white matter bilaterally.  Remote basal ganglia infarcts bilaterally are unchanged. Diffuse cerebral volume loss noted. Ventricles are stable in size. Negative for intra or extra-axial hemorrhage, evidence of hydrocephalus, or CT findings of acute cortically based infarction.  Extensive atherosclerotic calcification in the visualized vertebral arteries and the visualized intracranial internal carotid arteries. Skull is intact. Visualized paranasal sinuses and mastoid air cells are clear.  CT CERVICAL SPINE FINDINGS  Cervical spine is normally aligned from the skullbase through the cervicothoracic junction. Moderate disc height narrowing at C4-5, C5-6, and C6-7 is without significant change. Posterior osseous spurring and anterior osteophyte formation is seen at each of these levels. There is also mild posterior osseous spurring at C3-C4. Facet joint degenerative changes are noted bilaterally at C3-C4 and C4-C5.  Incomplete fusion of the posterior arch of C1 is again noted.  Moderate osseous neural foraminal narrowing on the right at C3-C4. Mild bilateral neural foraminal narrowing at C4-C5. Mild bilateral neural foraminal narrowing at C5-C6. Mild bilateral neural foraminal narrowing at C6-C7.  IMPRESSION: 1. No acute intracranial abnormality identified. Extensive chronic remote cerebral infarcts and chronic white matter ischemic changes. 2. Chronic cervical  spondylosis. 3. No evidence of acute bony injury to the cervical spine.   Electronically Signed   By: Britta Mccreedy M.D.   On: 07/04/2014 18:24   Ct Cervical Spine Wo Contrast  07/04/2014   CLINICAL DATA:  Patient found down.  Confusion.  EXAM: CT CERVICAL SPINE WITHOUT CONTRAST  TECHNIQUE: Multidetector CT imaging of the cervical spine was performed without intravenous contrast. Multiplanar CT image reconstructions were also generated.  COMPARISON:  Cervical spine CT scan 04/22/2012.  FINDINGS: No acute displaced fractures in the cervical spine. Severe multilevel degenerative disc disease again noted, most pronounced at C6-C7. Moderate multilevel facet arthropathy. Alignment is anatomic. Prevertebral soft tissues are normal. Incidental note is again made of incomplete fusion of the posterior arch of C1 (normal variant). Incidental imaging of the upper thorax is unremarkable.  IMPRESSION: 1. No acute abnormality of the cervical spine. 2. Severe multilevel degenerative disc disease and cervical spondylosis again noted, as above.   Electronically Signed   By: Trudie Reed M.D.   On: 07/04/2014 18:18   Mr Brain Wo Contrast  07/04/2014   CLINICAL DATA:  Fall, encephalopathy.  EXAM: MRI HEAD WITHOUT CONTRAST  TECHNIQUE: Multiplanar, multiecho pulse sequences of the brain and surrounding structures were obtained without intravenous contrast.  COMPARISON:  Prior CT from earlier the same day as well as prior MRI from 04/23/2012.  FINDINGS: Study is degraded by motion artifact.  Diffuse prominence of the CSF containing spaces is compatible with advanced generalized cerebral atrophy. Encephalomalacia within the left occipital lobe is compatible with remote left PCA territory infarct, stable from prior. Remote right parietal-occipital infarct is unchanged. Additional remote lacunar infarcts involving the left basal ganglia also unchanged. Small remote lacunar infarct within the posterior right centrum semi ovale.  Advanced chronic microvascular ischemic changes again noted, stable from prior.  No diffusion-weighted signal abnormality to suggest acute intracranial infarct. Gray-white matter differentiation maintained. No acute intracranial hemorrhage.  No mass lesion or midline shift. Ventricular prominence related to global atrophy present. No hydrocephalus. No extra-axial fluid collection.  Craniocervical junction widely patent. Degenerative spondylolysis noted within the upper cervical spine. Pituitary gland grossly unremarkable. No acute abnormality seen about either orbit.  Calvarium and scalp soft tissues within normal limits.  Paranasal sinuses clear.  No mastoid effusion.  IMPRESSION: 1. No acute intracranial infarct or other abnormality identified. 2. Global atrophy  with chronic small vessel ischemic disease and remote infarcts as above.   Electronically Signed   By: Rise Mu M.D.   On: 07/04/2014 23:22    ECG: NSR, probable LVH with repol changes, but ST changes are new compared to old tracings  TELEMETRY: NSR  IMPRESSION: 1. S/p fall with tib-fib fracture 2. Moderate to severe AS 3. History of paroxysmal AF 4. History of neurally mediated syncope, remote  RECOMMENDATION: 1. Moderate risk for CV complications with planned orthopedic surgery, but risks outweigh benefits 2. Do not stop metoprolol - continue periop on all days 3. Eliquis anticoagulant effect should be completely resolved by tomorrow morning; resume postop as soon as safe from surgical point of view 4. Will review echo, but even if AS is now severe, would recommend to proceed with surgery 5. ECG changes are concerning for ischemia, but in the absence of angina or dyspnea, plan workup only after surgery 6. She will need lengthy rehab and needs SW assistance for long term care planning. She lives alone with her 85 yo husband with dementia.  Time Spent Directly with Patient: 60 minutes  Thurmon Fair, MD, [facility]  [facility] [phone] office [phone] pager   07/05/2014, 11:26 AM

## 2014-07-05 NOTE — Progress Notes (Signed)
Patient ID: Lorraine Tran, female   DOB: 03-02-1922, 80 y.o.   MRN: [id number] Awake and follows commands.  Pleasantly demented.  Denies any significant pain.  Left leg in well-padded splint.  I am recommending surgery to place a rod in her left tibia to stabilize the fracture once she is medically cleared for surgery.  Non-operative treatment would be a long-leg cast if unable to proceed to the OR.

## 2014-07-05 NOTE — Progress Notes (Addendum)
[facility] PROGRESS NOTE  Lorraine Tran ZOX:[id number] DOB: Nov 16, 1921 DOA: 07/04/2014 PCP: Darrow Bussing, MD  Assessment/Plan: 1-Fall/Encephalopathy  -Likely multifactorial with contributions including dementia, hyperglycemia, dehydration  -gentle IVF, MRI without acute findings  -UA /CXR unremarkable -lactic acid improving  2- Fractures of L tib fib and calcaneus  -ortho following, awaiting clearance for IM rod/nail -hold eliquus  -pain control   3-DM/Hyperglycemia  -glipizide and januvia on hold -start low dose lantus -SSI, A1C   4-Afib  -resume metoprolol, hold eliquis   5. Diffuse ST depressions on EKG -loud systolic murmur -check ECHO -cycle enzymes -await cards clearance  6. Anemia -drop in Hb most likley due to hemodilution, got IVF bolus and 100cc/hr -cut down IVF -anemia panel, hemoccult stool -monitor q12  7-Leukocytosis  -likely reactive in setting of above  -FU blood and urine culture   8. Dementia -not on meds  FEN/GI: NPO   Prophylaxis: sub q heparin  Code Status: DNR Family Communication: none at bedside Disposition Plan: will need SNF   Consultants:  Cards  Ortho  HPI/Subjective: Confused,  No complaints  Objective: Filed Vitals:   07/05/14 0400  BP: 127/37  Pulse: 84  Temp: 97.6 F (36.4 C)  Resp:     Intake/Output Summary (Last 24 hours) at 07/05/14 0746 Last data filed at 07/05/14 0700  Gross per 24 hour  Intake   1000 ml  Output    900 ml  Net    100 ml   Filed Weights   07/04/14 2220 07/05/14 0400  Weight: 57.5 kg (126 lb 12.2 oz) 58.5 kg (128 lb 15.5 oz)    Exam:   General:  Alert, awake but disoriented, except to person  Cardiovascular: S1S2/RRR, systolic murmur noted  Respiratory: CTAB  Abdomen: soft, Nt, BS present  Musculoskeletal: L leg in brace from knee down  Data Reviewed: Basic Metabolic Panel:  Recent Labs Lab 07/04/14 1715 07/04/14 1722 07/05/14 0350  NA  --  139 145   K  --  4.2 4.4  CL  --  106 113*  CO2  --   --  19  GLUCOSE  --  353* 207*  BUN  --  24* 27*  CREATININE 0.86 0.90 0.88  CALCIUM  --   --  8.1*   Liver Function Tests:  Recent Labs Lab 07/04/14 1715 07/05/14 0350  AST 29 26  ALT 21 18  ALKPHOS 47 36*  BILITOT 0.8 0.4  PROT 6.0 5.0*  ALBUMIN 3.5 2.7*   No results found for this basename: LIPASE, AMYLASE,  in the last 168 hours No results found for this basename: AMMONIA,  in the last 168 hours CBC:  Recent Labs Lab 07/04/14 1715 07/04/14 1722 07/05/14 0350  WBC 20.1*  --  13.5*  NEUTROABS 18.0*  --  10.9*  HGB 11.3* 11.9* 8.9*  HCT 33.7* 35.0* 26.3*  MCV 88.0  --  86.8  PLT 261  --  212   Cardiac Enzymes:  Recent Labs Lab 07/04/14 1715 07/04/14 2047 07/05/14 0350  CKTOTAL 605* 640* 678*  TROPONINI  --  <0.30 <0.30   BNP (last 3 results) No results found for this basename: PROBNP,  in the last 8760 hours CBG:  Recent Labs Lab 07/04/14 1716 07/04/14 2053 07/05/14 0013 07/05/14 0507  GLUCAP 299* 287* 272* 187*    Recent Results (from the past 240 hour(s))  MRSA PCR SCREENING     Status: None   Collection Time    07/04/14 10:36 PM  Result Value Ref Range Status   MRSA by PCR NEGATIVE  NEGATIVE Final   Comment:            The GeneXpert MRSA Assay (FDA     approved for NASAL specimens     only), is one component of a     comprehensive MRSA colonization     surveillance program. It is not     intended to diagnose MRSA     infection nor to guide or     monitor treatment for     MRSA infections.     Studies: Dg Chest 1 View  07/04/2014   CLINICAL DATA:  Found down.  Altered mental status.  EXAM: CHEST - 1 VIEW  COMPARISON:  Chest x-ray 04/22/2012.  FINDINGS: Lung volumes are low. No consolidative airspace disease. No pleural effusions. No pneumothorax. No pulmonary nodule or mass noted. Pulmonary vasculature and the cardiomediastinal silhouette are within normal limits. Atherosclerosis in  the thoracic aorta. Mild bilateral apical pleural parenchymal thickening, presumably post infectious or inflammatory scarring (unchanged).  IMPRESSION: 1. Low lung volumes without radiographic evidence of acute cardiopulmonary disease. 2. Atherosclerosis.   Electronically Signed   By: Trudie Reed M.D.   On: 07/04/2014 18:58   Dg Thoracic Spine 2 View  07/04/2014   CLINICAL DATA:  Patient fell.  Altered mental status.  EXAM: THORACIC SPINE - 2 VIEW  COMPARISON:  Chest x-rays dated 07/04/2014 and 04/22/2012  FINDINGS: There is a very slight anterior wedge deformity of the superior endplate of T4, age indeterminate. There is diffuse osteopenia. T1 is not well visualized on the available images.  Heart size is normal.  Suggestion of a moderate hiatal hernia.  IMPRESSION: Slight compression deformity of the superior endplate of T4, age indeterminate. T1 is not well visualized on the lateral view.   Electronically Signed   By: Geanie Cooley M.D.   On: 07/04/2014 19:02   Dg Lumbar Spine Complete  07/04/2014   CLINICAL DATA:  Found down.  Altered mental status.  EXAM: LUMBAR SPINE - COMPLETE 4+ VIEW  COMPARISON:  Lumbar spine MRI 04/23/2012.  FINDINGS: Old compression fracture of L1 with approximately 70% loss of anterior vertebral body height. No new acute displaced fractures or compression type fractures are otherwise noted. Multilevel degenerative disc disease, most severe at L5-S1. Severe multilevel facet arthropathy, most severe at L4-L5 and L5-S1. There is a 5 mm of anterolisthesis of L4 upon L5. 8 mm of anterolisthesis of L5 upon S1. Mild exaggeration of normal lumbar lordosis and mild levoscoliosis in the mid lumbar spine, but alignment is otherwise anatomic, although there is a mild acute kyphosis at the level of T12-L1, related to the chronic compression fracture. Extensive atherosclerosis.  IMPRESSION: 1. No acute findings. 2. Extensive multilevel degenerative disc disease, lumbar spondylosis, mild  lumbar levoscoliosis, and old compression fracture at L2 are similar to prior examinations, as above.   Electronically Signed   By: Trudie Reed M.D.   On: 07/04/2014 19:04   Dg Pelvis 1-2 Views  07/04/2014   CLINICAL DATA:  Patient found down.  EXAM: PELVIS - 1-2 VIEW  COMPARISON:  Leg radiographs today.  FINDINGS: Diffuse osteopenia. No displaced pelvic fracture. Atherosclerosis. Both hips are externally rotated. No displaced hip fracture is identified. Lumbar spondylosis. Moderate to large amount of stool in the rectosigmoid.  IMPRESSION: No acute osseous abnormality.   Electronically Signed   By: Andreas Newport M.D.   On: 07/04/2014 19:01   Dg Tibia/fibula Left  07/04/2014   CLINICAL DATA:  Altered mental status.  Leg pain.  Found down.  EXAM: LEFT TIBIA AND FIBULA - 2 VIEW  COMPARISON:  None.  FINDINGS: Comminuted proximal tibial diaphysis fracture is present. 7 mm medial displacement of the medial cortical shard. Nonstandard projections are submitted. There also is a proximal to mid fibular shaft fracture with one shaft width medial displacement. Diffuse osteopenia. Atherosclerosis.  IMPRESSION: Study degraded by nonstandard projections. Mildly displaced comminuted proximal tibial shaft fracture. Proximal to mid fibular shaft fracture with 1 shaft width medial displacement.   Electronically Signed   By: Andreas Newport M.D.   On: 07/04/2014 18:58   Dg Ankle Complete Left  07/04/2014   CLINICAL DATA:  Left ankle swelling.  EXAM: LEFT ANKLE COMPLETE - 3+ VIEW  COMPARISON:  None.  FINDINGS: An oblique calcaneal fracture is identified extending from the subtalar joint to the inferior surface.  No other fracture, subluxation or dislocation identified.  Mild degenerative changes at the tibiotalar joint noted.  Heavy vascular calcifications are identified.  IMPRESSION: Intraarticular calcaneal fracture extending from the subtalar joint to the inferior surface.   Electronically Signed   By: Laveda Abbe M.D.    On: 07/04/2014 19:02   Ct Head Wo Contrast  07/04/2014   CLINICAL DATA:  Fall.  Found down.  Confused.  EXAM: CT HEAD WITHOUT CONTRAST  CT CERVICAL SPINE WITHOUT CONTRAST  TECHNIQUE: Multidetector CT imaging of the head and cervical spine was performed following the standard protocol without intravenous contrast. Multiplanar CT image reconstructions of the cervical spine were also generated.  COMPARISON:  CT head and cervical spine 04/22/2012  FINDINGS: CT HEAD FINDINGS  Stable extensive encephalomalacia in the parietal lobes bilaterally and in the left occipital lobe, suggestive of prior infarctions. Stable chronic hypodensity in the periventricular white matter bilaterally. Remote basal ganglia infarcts bilaterally are unchanged. Diffuse cerebral volume loss noted. Ventricles are stable in size. Negative for intra or extra-axial hemorrhage, evidence of hydrocephalus, or CT findings of acute cortically based infarction.  Extensive atherosclerotic calcification in the visualized vertebral arteries and the visualized intracranial internal carotid arteries. Skull is intact. Visualized paranasal sinuses and mastoid air cells are clear.  CT CERVICAL SPINE FINDINGS  Cervical spine is normally aligned from the skullbase through the cervicothoracic junction. Moderate disc height narrowing at C4-5, C5-6, and C6-7 is without significant change. Posterior osseous spurring and anterior osteophyte formation is seen at each of these levels. There is also mild posterior osseous spurring at C3-C4. Facet joint degenerative changes are noted bilaterally at C3-C4 and C4-C5.  Incomplete fusion of the posterior arch of C1 is again noted.  Moderate osseous neural foraminal narrowing on the right at C3-C4. Mild bilateral neural foraminal narrowing at C4-C5. Mild bilateral neural foraminal narrowing at C5-C6. Mild bilateral neural foraminal narrowing at C6-C7.  IMPRESSION: 1. No acute intracranial abnormality identified. Extensive  chronic remote cerebral infarcts and chronic white matter ischemic changes. 2. Chronic cervical spondylosis. 3. No evidence of acute bony injury to the cervical spine.   Electronically Signed   By: Britta Mccreedy M.D.   On: 07/04/2014 18:24   Ct Cervical Spine Wo Contrast  07/04/2014   CLINICAL DATA:  Patient found down.  Confusion.  EXAM: CT CERVICAL SPINE WITHOUT CONTRAST  TECHNIQUE: Multidetector CT imaging of the cervical spine was performed without intravenous contrast. Multiplanar CT image reconstructions were also generated.  COMPARISON:  Cervical spine CT scan 04/22/2012.  FINDINGS: No acute displaced fractures in the cervical  spine. Severe multilevel degenerative disc disease again noted, most pronounced at C6-C7. Moderate multilevel facet arthropathy. Alignment is anatomic. Prevertebral soft tissues are normal. Incidental note is again made of incomplete fusion of the posterior arch of C1 (normal variant). Incidental imaging of the upper thorax is unremarkable.  IMPRESSION: 1. No acute abnormality of the cervical spine. 2. Severe multilevel degenerative disc disease and cervical spondylosis again noted, as above.   Electronically Signed   By: Trudie Reed M.D.   On: 07/04/2014 18:18   Mr Brain Wo Contrast  07/04/2014   CLINICAL DATA:  Fall, encephalopathy.  EXAM: MRI HEAD WITHOUT CONTRAST  TECHNIQUE: Multiplanar, multiecho pulse sequences of the brain and surrounding structures were obtained without intravenous contrast.  COMPARISON:  Prior CT from earlier the same day as well as prior MRI from 04/23/2012.  FINDINGS: Study is degraded by motion artifact.  Diffuse prominence of the CSF containing spaces is compatible with advanced generalized cerebral atrophy. Encephalomalacia within the left occipital lobe is compatible with remote left PCA territory infarct, stable from prior. Remote right parietal-occipital infarct is unchanged. Additional remote lacunar infarcts involving the left basal ganglia  also unchanged. Small remote lacunar infarct within the posterior right centrum semi ovale. Advanced chronic microvascular ischemic changes again noted, stable from prior.  No diffusion-weighted signal abnormality to suggest acute intracranial infarct. Gray-white matter differentiation maintained. No acute intracranial hemorrhage.  No mass lesion or midline shift. Ventricular prominence related to global atrophy present. No hydrocephalus. No extra-axial fluid collection.  Craniocervical junction widely patent. Degenerative spondylolysis noted within the upper cervical spine. Pituitary gland grossly unremarkable. No acute abnormality seen about either orbit.  Calvarium and scalp soft tissues within normal limits.  Paranasal sinuses clear.  No mastoid effusion.  IMPRESSION: 1. No acute intracranial infarct or other abnormality identified. 2. Global atrophy with chronic small vessel ischemic disease and remote infarcts as above.   Electronically Signed   By: Rise Mu M.D.   On: 07/04/2014 23:22    Scheduled Meds: . antiseptic oral rinse  7 mL Mouth Rinse q12n4p  . chlorhexidine  15 mL Mouth Rinse BID  . heparin  5,000 Units Subcutaneous 3 times per day  . insulin aspart  0-9 Units Subcutaneous 6 times per day  . insulin glargine  10 Units Subcutaneous QHS  . metoprolol tartrate  25 mg Oral BID   Continuous Infusions: . sodium chloride 100 mL/hr at 07/04/14 2220   Antibiotics Given (last 72 hours)   None      Active Problems:   Fall   Leg fracture, left   Encephalopathy   Hyperglycemia    Time spent:    [facility]  [facility] Pager [phone]. If 7PM-7AM, please contact night-coverage at [email or web address], password [facility] 07/05/2014, 7:46 AM  LOS: 1 day

## 2014-07-05 NOTE — Progress Notes (Signed)
PT Cancellation Note  Patient Details Name: Lorraine Tran MRN: [id number] DOB: May 06, 1922   Cancelled Treatment:    Reason Eval/Treat Not Completed: Patient not medically ready. Awaiting decision regarding surgical intervention. Further clarification regarding weight bearing status needed, pending surgical intervention.  Will await updated orders.   Fabio Asa 07/05/2014, 8:17 AM Charlotte Crumb, PT DPT  [phone]

## 2014-07-06 ENCOUNTER — Inpatient Hospital Stay (HOSPITAL_COMMUNITY): Payer: Medicare Other | Admitting: Anesthesiology

## 2014-07-06 ENCOUNTER — Encounter (HOSPITAL_COMMUNITY)
Admission: EM | Disposition: A | Discharge status: Skilled Nursing Facility | Payer: Self-pay | Source: Home / Self Care | Attending: Internal Medicine

## 2014-07-06 ENCOUNTER — Inpatient Hospital Stay (HOSPITAL_COMMUNITY): Payer: Medicare Other

## 2014-07-06 ENCOUNTER — Encounter (HOSPITAL_COMMUNITY): Payer: Medicare Other | Admitting: Anesthesiology

## 2014-07-06 DIAGNOSIS — I359 Nonrheumatic aortic valve disorder, unspecified: Secondary | ICD-10-CM

## 2014-07-06 HISTORY — PX: TIBIA IM NAIL INSERTION: SHX2516

## 2014-07-06 LAB — GLUCOSE, CAPILLARY
GLUCOSE-CAPILLARY: 150 mg/dL — AB (ref 70–99)
GLUCOSE-CAPILLARY: 180 mg/dL — AB (ref 70–99)
GLUCOSE-CAPILLARY: 244 mg/dL — AB (ref 70–99)
Glucose-Capillary: 162 mg/dL — ABNORMAL HIGH (ref 70–99)
Glucose-Capillary: 223 mg/dL — ABNORMAL HIGH (ref 70–99)
Glucose-Capillary: 224 mg/dL — ABNORMAL HIGH (ref 70–99)
Glucose-Capillary: 254 mg/dL — ABNORMAL HIGH (ref 70–99)

## 2014-07-06 LAB — COMPREHENSIVE METABOLIC PANEL
ALT: 20 U/L (ref 0–35)
ANION GAP: 12 (ref 5–15)
AST: 39 U/L — AB (ref 0–37)
Albumin: 2.5 g/dL — ABNORMAL LOW (ref 3.5–5.2)
Alkaline Phosphatase: 42 U/L (ref 39–117)
BUN: 21 mg/dL (ref 6–23)
CO2: 21 meq/L (ref 19–32)
CREATININE: 0.64 mg/dL (ref 0.50–1.10)
Calcium: 7.9 mg/dL — ABNORMAL LOW (ref 8.4–10.5)
Chloride: 106 mEq/L (ref 96–112)
GFR calc Af Amer: 89 mL/min — ABNORMAL LOW (ref >90)
GFR, EST NON AFRICAN AMERICAN: 77 mL/min — AB (ref >90)
Glucose, Bld: 150 mg/dL — ABNORMAL HIGH (ref 70–99)
Potassium: 4.3 mEq/L (ref 3.7–5.3)
Sodium: 139 mEq/L (ref 137–147)
Total Bilirubin: 0.4 mg/dL (ref 0.3–1.2)
Total Protein: 5 g/dL — ABNORMAL LOW (ref 6.0–8.3)

## 2014-07-06 LAB — CBC WITH DIFFERENTIAL/PLATELET
BASOS ABS: 0 10*3/uL (ref 0.0–0.1)
Basophils Relative: 0 % (ref 0–1)
EOS PCT: 2 % (ref 0–5)
Eosinophils Absolute: 0.3 10*3/uL (ref 0.0–0.7)
HCT: 25.7 % — ABNORMAL LOW (ref 36.0–46.0)
Hemoglobin: 8.5 g/dL — ABNORMAL LOW (ref 12.0–15.0)
LYMPHS ABS: 1.6 10*3/uL (ref 0.7–4.0)
LYMPHS PCT: 12 % (ref 12–46)
MCH: 28.9 pg (ref 26.0–34.0)
MCHC: 33.1 g/dL (ref 30.0–36.0)
MCV: 87.4 fL (ref 78.0–100.0)
Monocytes Absolute: 1.5 10*3/uL — ABNORMAL HIGH (ref 0.1–1.0)
Monocytes Relative: 11 % (ref 3–12)
NEUTROS ABS: 10.5 10*3/uL — AB (ref 1.7–7.7)
Neutrophils Relative %: 75 % (ref 43–77)
PLATELETS: 198 10*3/uL (ref 150–400)
RBC: 2.94 MIL/uL — AB (ref 3.87–5.11)
RDW: 14.6 % (ref 11.5–15.5)
WBC: 13.9 10*3/uL — AB (ref 4.0–10.5)

## 2014-07-06 LAB — POCT I-STAT 4, (NA,K, GLUC, HGB,HCT)
Glucose, Bld: 227 mg/dL — ABNORMAL HIGH (ref 70–99)
HEMATOCRIT: 19 % — AB (ref 36.0–46.0)
Hemoglobin: 6.5 g/dL — CL (ref 12.0–15.0)
Potassium: 4.2 mEq/L (ref 3.7–5.3)
SODIUM: 140 meq/L (ref 137–147)

## 2014-07-06 LAB — ABO/RH: ABO/RH(D): A POS

## 2014-07-06 LAB — PREPARE RBC (CROSSMATCH)

## 2014-07-06 SURGERY — INSERTION, INTRAMEDULLARY ROD, TIBIA
Anesthesia: General | Site: Leg Lower | Laterality: Right

## 2014-07-06 MED ORDER — PROPOFOL 10 MG/ML IV BOLUS
INTRAVENOUS | Status: DC | PRN
  Administered 2014-07-06: 100 mg via INTRAVENOUS

## 2014-07-06 MED ORDER — ROCURONIUM BROMIDE 50 MG/5ML IV SOLN
INTRAVENOUS | Status: AC
  Filled 2014-07-06: qty 1

## 2014-07-06 MED ORDER — LIDOCAINE HCL (CARDIAC) 20 MG/ML IV SOLN
INTRAVENOUS | Status: DC | PRN
  Administered 2014-07-06: 60 mg via INTRAVENOUS

## 2014-07-06 MED ORDER — PROPOFOL 10 MG/ML IV BOLUS
INTRAVENOUS | Status: AC
  Filled 2014-07-06: qty 20

## 2014-07-06 MED ORDER — FENTANYL CITRATE 0.05 MG/ML IJ SOLN
INTRAMUSCULAR | Status: DC | PRN
  Administered 2014-07-06: 25 ug via INTRAVENOUS
  Administered 2014-07-06: 100 ug via INTRAVENOUS
  Administered 2014-07-06: 25 ug via INTRAVENOUS
  Administered 2014-07-06 (×2): 50 ug via INTRAVENOUS

## 2014-07-06 MED ORDER — SODIUM CHLORIDE 0.9 % IV SOLN
INTRAVENOUS | Status: DC
  Administered 2014-07-06: 22:00:00 via INTRAVENOUS

## 2014-07-06 MED ORDER — LABETALOL HCL 5 MG/ML IV SOLN
INTRAVENOUS | Status: DC | PRN
  Administered 2014-07-06: 10 mg via INTRAVENOUS

## 2014-07-06 MED ORDER — SODIUM CHLORIDE 0.9 % IV SOLN: INTRAVENOUS | Status: AC

## 2014-07-06 MED ORDER — FENTANYL CITRATE 0.05 MG/ML IJ SOLN
INTRAMUSCULAR | Status: AC
  Filled 2014-07-06: qty 5

## 2014-07-06 MED ORDER — APIXABAN 2.5 MG PO TABS
2.5000 mg | ORAL_TABLET | Freq: Two times a day (BID) | ORAL | Status: DC
  Administered 2014-07-07 – 2014-07-09 (×5): 2.5 mg via ORAL
  Filled 2014-07-06 (×6): qty 1

## 2014-07-06 MED ORDER — ROCURONIUM BROMIDE 100 MG/10ML IV SOLN
INTRAVENOUS | Status: DC | PRN
  Administered 2014-07-06: 25 mg via INTRAVENOUS

## 2014-07-06 MED ORDER — LACTATED RINGERS IV SOLN
INTRAVENOUS | Status: DC | PRN
  Administered 2014-07-06: 11:00:00 via INTRAVENOUS

## 2014-07-06 MED ORDER — INSULIN ASPART 100 UNIT/ML ~~LOC~~ SOLN
4.0000 [IU] | Freq: Once | SUBCUTANEOUS | Status: AC
  Administered 2014-07-06: 4 [IU] via INTRAVENOUS
  Filled 2014-07-06: qty 0.04

## 2014-07-06 MED ORDER — ONDANSETRON HCL 4 MG/2ML IJ SOLN: 4.0000 mg | Freq: Once | INTRAMUSCULAR | Status: AC | PRN

## 2014-07-06 MED ORDER — SODIUM CHLORIDE 0.9 % IR SOLN
Status: DC | PRN
  Administered 2014-07-06: 1000 mL

## 2014-07-06 MED ORDER — DEXTROSE 5 % IV SOLN
10.0000 mg | INTRAVENOUS | Status: DC | PRN
  Administered 2014-07-06: 80 ug/min via INTRAVENOUS

## 2014-07-06 MED ORDER — SODIUM CHLORIDE 0.9 % IV SOLN
INTRAVENOUS | Status: DC | PRN
  Administered 2014-07-06: 12:00:00 via INTRAVENOUS

## 2014-07-06 MED ORDER — LIDOCAINE HCL (CARDIAC) 20 MG/ML IV SOLN
INTRAVENOUS | Status: AC
  Filled 2014-07-06: qty 5

## 2014-07-06 MED ORDER — CLINDAMYCIN PHOSPHATE 600 MG/50ML IV SOLN
INTRAVENOUS | Status: AC
  Administered 2014-07-06: 600 mg via INTRAVENOUS
  Filled 2014-07-06: qty 50

## 2014-07-06 MED ORDER — FENTANYL CITRATE 0.05 MG/ML IJ SOLN
INTRAMUSCULAR | Status: AC
  Filled 2014-07-06: qty 2

## 2014-07-06 MED ORDER — FENTANYL CITRATE 0.05 MG/ML IJ SOLN
25.0000 ug | INTRAMUSCULAR | Status: DC | PRN
  Administered 2014-07-06: 25 ug via INTRAVENOUS

## 2014-07-06 MED ORDER — LABETALOL HCL 5 MG/ML IV SOLN
INTRAVENOUS | Status: AC
  Filled 2014-07-06: qty 4

## 2014-07-06 MED ORDER — ONDANSETRON HCL 4 MG/2ML IJ SOLN
INTRAMUSCULAR | Status: AC
  Filled 2014-07-06: qty 2

## 2014-07-06 SURGICAL SUPPLY — 75 items
BANDAGE ELASTIC 4 VELCRO ST LF (GAUZE/BANDAGES/DRESSINGS) ×8 IMPLANT
BANDAGE ELASTIC 6 VELCRO ST LF (GAUZE/BANDAGES/DRESSINGS) ×8 IMPLANT
BIT DRILL 3.8X6 NS (BIT) ×4 IMPLANT
BIT DRILL 4.4 NS (BIT) ×4 IMPLANT
BLADE SURG 10 STRL SS (BLADE) ×4 IMPLANT
BNDG COHESIVE 4X5 TAN STRL (GAUZE/BANDAGES/DRESSINGS) ×4 IMPLANT
BNDG GAUZE ELAST 4 BULKY (GAUZE/BANDAGES/DRESSINGS) ×4 IMPLANT
COVER MAYO STAND STRL (DRAPES) ×4 IMPLANT
COVER SURGICAL LIGHT HANDLE (MISCELLANEOUS) ×8 IMPLANT
CUFF TOURNIQUET SINGLE 34IN LL (TOURNIQUET CUFF) IMPLANT
CUFF TOURNIQUET SINGLE 44IN (TOURNIQUET CUFF) IMPLANT
DRAPE C-ARM 42X72 X-RAY (DRAPES) ×8 IMPLANT
DRAPE C-ARMOR (DRAPES) ×8 IMPLANT
DRAPE INCISE IOBAN 66X45 STRL (DRAPES) IMPLANT
DRAPE ORTHO SPLIT 77X108 STRL (DRAPES) ×4
DRAPE PROXIMA HALF (DRAPES) ×16 IMPLANT
DRAPE SURG ORHT 6 SPLT 77X108 (DRAPES) ×4 IMPLANT
DRAPE U-SHAPE 47X51 STRL (DRAPES) ×8 IMPLANT
DRESSING OPSITE X SMALL 2X3 (GAUZE/BANDAGES/DRESSINGS) ×4 IMPLANT
DRSG PAD ABDOMINAL 8X10 ST (GAUZE/BANDAGES/DRESSINGS) ×16 IMPLANT
DURAPREP 26ML APPLICATOR (WOUND CARE) ×8 IMPLANT
ELECT PENCIL ROCKER SW 15FT (MISCELLANEOUS) ×4 IMPLANT
ELECT REM PT RETURN 9FT ADLT (ELECTROSURGICAL) ×4
ELECTRODE REM PT RTRN 9FT ADLT (ELECTROSURGICAL) ×2 IMPLANT
FACESHIELD STD STERILE (MASK) ×4 IMPLANT
GAUZE SPONGE 4X4 12PLY STRL (GAUZE/BANDAGES/DRESSINGS) ×4 IMPLANT
GAUZE XEROFORM 1X8 LF (GAUZE/BANDAGES/DRESSINGS) ×4 IMPLANT
GAUZE XEROFORM 5X9 LF (GAUZE/BANDAGES/DRESSINGS) ×4 IMPLANT
GLOVE BIO SURGEON STRL SZ8 (GLOVE) ×4 IMPLANT
GLOVE BIOGEL PI IND STRL 8 (GLOVE) ×8 IMPLANT
GLOVE BIOGEL PI INDICATOR 8 (GLOVE) ×8
GLOVE ORTHO TXT STRL SZ7.5 (GLOVE) ×16 IMPLANT
GOWN STRL REUS W/ TWL LRG LVL3 (GOWN DISPOSABLE) ×4 IMPLANT
GOWN STRL REUS W/ TWL XL LVL3 (GOWN DISPOSABLE) ×8 IMPLANT
GOWN STRL REUS W/TWL LRG LVL3 (GOWN DISPOSABLE) ×4
GOWN STRL REUS W/TWL XL LVL3 (GOWN DISPOSABLE) ×8
GUIDEWIRE BALL NOSE 80CM (WIRE) ×4 IMPLANT
KIT BASIN OR (CUSTOM PROCEDURE TRAY) ×4 IMPLANT
KIT ROOM TURNOVER OR (KITS) ×4 IMPLANT
MANIFOLD NEPTUNE II (INSTRUMENTS) IMPLANT
NAIL TIBIAL 11MMX31.5CM (Nail) ×4 IMPLANT
NAIL TIBIAL 11X30CM (Nail) ×4 IMPLANT
NEEDLE HYPO 21X1.5 SAFETY (NEEDLE) IMPLANT
NS IRRIG 1000ML POUR BTL (IV SOLUTION) ×4 IMPLANT
PACK ORTHO EXTREMITY (CUSTOM PROCEDURE TRAY) ×4 IMPLANT
PAD ARMBOARD 7.5X6 YLW CONV (MISCELLANEOUS) ×8 IMPLANT
PAD CAST 4YDX4 CTTN HI CHSV (CAST SUPPLIES) ×2 IMPLANT
PADDING CAST ABS 4INX4YD NS (CAST SUPPLIES) ×2
PADDING CAST ABS 6INX4YD NS (CAST SUPPLIES) ×2
PADDING CAST ABS COTTON 4X4 ST (CAST SUPPLIES) ×2 IMPLANT
PADDING CAST ABS COTTON 6X4 NS (CAST SUPPLIES) ×2 IMPLANT
PADDING CAST COTTON 4X4 STRL (CAST SUPPLIES) ×2
PADDING CAST COTTON 6X4 STRL (CAST SUPPLIES) ×4 IMPLANT
PIN GUIDE 3.2X14 1401214 (MISCELLANEOUS) ×4 IMPLANT
SCREW ACECAP 36MM (Screw) ×4 IMPLANT
SCREW ACECAP 40MM (Screw) ×4 IMPLANT
SCREW CORT BONE 4.5X38 1402238 (Screw) ×4 IMPLANT
SCREW CORT BONE 4.5X50 1402250 (Screw) ×4 IMPLANT
SCREW CORTICAL 5.5 35MM (Screw) ×4 IMPLANT
SCREW PROXIMAL DEPUY (Screw) ×4 IMPLANT
SCREW PRXML FT 55X5.5XNS TIB (Screw) ×4 IMPLANT
SPONGE LAP 18X18 X RAY DECT (DISPOSABLE) ×4 IMPLANT
SPONGE LAP 4X18 X RAY DECT (DISPOSABLE) ×4 IMPLANT
STAPLER VISISTAT 35W (STAPLE) ×4 IMPLANT
STOCKINETTE IMPERVIOUS LG (DRAPES) ×4 IMPLANT
SUT VIC AB 0 CTB1 27 (SUTURE) ×4 IMPLANT
SUT VIC AB 2-0 CT1 27 (SUTURE) ×2
SUT VIC AB 2-0 CT1 TAPERPNT 27 (SUTURE) ×2 IMPLANT
SYR CONTROL 10ML LL (SYRINGE) IMPLANT
TOWEL OR 17X24 6PK STRL BLUE (TOWEL DISPOSABLE) ×4 IMPLANT
TOWEL OR 17X26 10 PK STRL BLUE (TOWEL DISPOSABLE) ×4 IMPLANT
TUBE CONNECTING 12'X1/4 (SUCTIONS) ×2
TUBE CONNECTING 12X1/4 (SUCTIONS) ×6 IMPLANT
WATER STERILE IRR 1000ML POUR (IV SOLUTION) IMPLANT
YANKAUER SUCT BULB TIP NO VENT (SUCTIONS) ×4 IMPLANT

## 2014-07-06 NOTE — Anesthesia Procedure Notes (Signed)
Procedure Name: Intubation Date/Time: 07/06/2014 11:36 AM Performed by: Alanda Amass A Pre-anesthesia Checklist: Patient identified, Timeout performed, Emergency Drugs available, Suction available and Patient being monitored Patient Re-evaluated:Patient Re-evaluated prior to inductionOxygen Delivery Method: Circle system utilized Preoxygenation: Pre-oxygenation with 100% oxygen Intubation Type: IV induction Ventilation: Mask ventilation without difficulty Laryngoscope Size: Mac and 3 Grade View: Grade IV Tube type: Oral Tube size: 7.5 mm Number of attempts: 1 Airway Equipment and Method: Stylet Placement Confirmation: positive ETCO2 and breath sounds checked- equal and bilateral Secured at: 21 cm Tube secured with: Tape Dental Injury: Teeth and Oropharynx as per pre-operative assessment

## 2014-07-06 NOTE — Op Note (Signed)
NAME:  Lorraine Tran, Lorraine Tran NO.:  [id number]  MEDICAL RECORD NO.:  [id number]  LOCATION:  3S05C                        FACILITY:  [facility]  PHYSICIAN:  Vanita Panda. Magnus Ivan, M.D.DATE OF BIRTH:  09-Dec-1921  DATE OF PROCEDURE:  07/06/2014 DATE OF DISCHARGE:                              OPERATIVE REPORT   PREOPERATIVE DIAGNOSIS:  Left proximal third tibia/fibula fracture.  POSTOPERATIVE DIAGNOSES: 1. Left proximal third tibia/fibula fracture. 2. Right distal third tibia fracture.  PROCEDURES:  Bilateral and tibial intramedullary nail placement.  IMPLANTS: 1. Left leg Biomet 11 x 30 tibial nail with 2 proximal and 2 distal     interlocks. 2. Right Biomet tibial nail in the right tibia 11 x 31.5 with 1     proximal and 2 distal interlocks.  SURGEON:  Vanita Panda. Magnus Ivan, M.D.  ASSISTANT:  Richardean Canal, PA-C  ANESTHESIA:  General.  BLOOD LOSS:  Less than 100 mL.  ANTIBIOTICS:  900 mg IV clindamycin.  COMPLICATIONS:  None.  INDICATIONS:  Ms. Laham is an very pleasantly demented 80 year old female, who lives with her daughter and her demented husband.  On Friday, she was found down out in her yard and having sustained a mechanical fall with inability to ambulate.  She was down for a while, as brought to the emergency room and had obvious deformity of her left leg with considerable swelling and bruising.  X-rays, she withdrew to pain, but did not report any other injuries other than severe left leg pain.  She was found to have a comminuted proximal third tibia shaft fracture with a fibular fracture as well.  She did not complain of any symptoms on her right side.  There was also no outwardly bruising.  Her left leg was placed in a long leg splint and she was admitted to the step-down unit on the Medicine Service.  She needed to have Medical and Cardiology clearance prior to surgery as well as medical optimizing due to her dehydration.  She is now  presenting to have her left tibia placed with IM nail.  I have talked to her about this in length and her daughter who is her healthcare power of attorney who is only one who can give informed consent given her severe dementia.  She did give informed consent in person for the left leg.  The left leg was then marked as well.  She was taken to the operating room and as she is being placed supine on the operating table, she was complaining a lot of more pain in her right leg that was not splinted.  At this point, we did note bruising of the distal third of the tibia and after she was put under general anesthesia, I examined the left distal tibia.  I could not feel a specific deformity, but we did bring in the radiographic unit and found that she had a spiral nondisplaced distal third right tibia fracture.  With that being said and have her in her under general anesthesia, I talked to the daughter on the phone in length about our findings in the operating room on the right leg and recommended intramedullary nail be placed in this leg in the same setting.  I was given the go ahead by the daughter and we had this verbal consent and telephone consent witnessed by staff and anesthesia concurred.  We then proceeded with surgery.  DESCRIPTION OF PROCEDURE:  First, the left leg was prepped and draped with Betadine scrub and paint.  A time-out was called identifying the correct patient, correct left leg.  With a radiolucent triangle under the knee to flex the knee, we then made incision directly over the patellar tendon and carried this down to the patellar tendon which we divided longitudinally.  Under direct fluoroscopic guidance, we placed a temporary guide pin in the anterior portion of the tibia in a center- center position.  We used a initiating reamer to open up the tibial canal.  We then placed a long temporary guide pin down the middle of the tibial canal traversing the fracture and down to  the distal tibia.  We then began reaming from 5 mm increments from size 8 up to a size 12.  We then took a measurement off the rod as well and chose a 11 x 30 tibial nail. Once we made this measurement, we then placed in the left tibia over the guide rod a 11 x 30 tibial nail down the canal traversing the fracture.  The guide pin was then removed and we placed 2 proximal interlocks and then 2 distal interlocks through a separate stab incisions.  We then copiously irrigated all wounds and closed the deep wounds with 0 Vicryl followed by 2-0 Vicryl in the subcutaneous tissue and staples on all skin incisions.  Well-padded sterile dressing was applied throughout.  We then took down the drapes and kept the back table sterile.  We then re-prepped and draped the right leg for proceeding with the right leg.  Anesthesia said that she was doing well and was okay to proceed on that side.  We then prepped the right leg with DuraPrep and sterile drapes.  A time-out was called identifying the correct patient, correct right leg.  We then made incision just like the one on the left side over the right patella tendon and dissected down the patellar tendon itself.  We opened up the patella tendon longitudinally and then placed a temporary guide pin in a center-center position of the right knee.  We then passed a temporary guide pin and reamed up to a size 12.  We then placed a 11 x 31.5 tibial nail on that side because we needed to go more distal due to the distal nature of her fracture.  Once this was done, we placed a 1 proximal interlock and 2 distal interlocks 1 from anterior to posterior and 1 from medial to lateral.  We then irrigated all those wounds and closed the deep tissue with 0 Vicryl followed by 2-0 Vicryl in subcutaneous tissue, interrupted staples on the skin.  Both legs were again placed in a well-padded sterile dressing.  She was awakened, extubated, and taken to the recovery room in  stable condition.  All final counts were correct. There were no complications noted.  Of note, Richardean Canal, PA-C, his assistance was integral throughout the whole case.     Vanita Panda. Magnus Ivan, M.D.     CYB/MEDQ  D:  07/06/2014  T:  07/06/2014  Job:  [id number]

## 2014-07-06 NOTE — Brief Op Note (Signed)
07/04/2014 - 07/06/2014  1:44 PM  PATIENT:  Lorraine Tran  80 y.o. female  PRE-OPERATIVE DIAGNOSIS:  LEFT TIBIA FRACTURE, RIGHT TIBIA FRACTURE  POST-OPERATIVE DIAGNOSIS:  1)  LEFT TIBIA FRACTURE             2)  RIGHT TIBIA FRACTURE  PROCEDURE:  Procedure(s): INTRAMEDULLARY (IM) NAIL TIBIAL (Left) INTRAMEDULLARY (IM) NAIL TIBIAL (Right)  SURGEON:  Surgeon(s) and Role: Panel 1:    * Kathryne Hitch, MD - Primary  Panel 2:    * Kathryne Hitch, MD - Primary  PHYSICIAN ASSISTANT: Rexene Edison, PA-C  ANESTHESIA:   general  EBL:  Total I/O In: 1670 [I.V.:1000; Blood:670] Out: 665 [Urine:515; Blood:150]  BLOOD ADMINISTERED:  1 unit PRBC  COUNTS:  YES  TOURNIQUET:  * No tourniquets in log *  DICTATION: .Other Dictation: Dictation Number [id number]  PLAN OF CARE: Admit to inpatient   PATIENT DISPOSITION:  PACU - hemodynamically stable.   Delay start of Pharmacological VTE agent (>24hrs) due to surgical blood loss or risk of bleeding: no

## 2014-07-06 NOTE — Anesthesia Preprocedure Evaluation (Addendum)
Anesthesia Evaluation  Patient identified by MRN, date of birth, ID band Patient awake    Reviewed: Allergy & Precautions, H&P , NPO status , Patient's Chart, lab work & pertinent test results  Airway Mallampati: II TM Distance: >3 FB Neck ROM: full    Dental  (+) Dental Advisory Given   Pulmonary neg pulmonary ROS,          Cardiovascular hypertension, + dysrhythmias Atrial Fibrillation + Valvular Problems/Murmurs AS  Moderate AS by TTE 2013   Neuro/Psych Dementia CVA    GI/Hepatic   Endo/Other  diabetes, Type 2  Renal/GU      Musculoskeletal   Abdominal   Peds  Hematology   Anesthesia Other Findings   Reproductive/Obstetrics                          Anesthesia Physical Anesthesia Plan  ASA: III  Anesthesia Plan: General   Post-op Pain Management:    Induction: Intravenous  Airway Management Planned: Oral ETT  Additional Equipment:   Intra-op Plan:   Post-operative Plan: Extubation in OR  Informed Consent: I have reviewed the patients History and Physical, chart, labs and discussed the procedure including the risks, benefits and alternatives for the proposed anesthesia with the patient or authorized representative who has indicated his/her understanding and acceptance.     Plan Discussed with: CRNA, Anesthesiologist and Surgeon  Anesthesia Plan Comments:         Anesthesia Quick Evaluation

## 2014-07-06 NOTE — Transfer of Care (Signed)
Immediate Anesthesia Transfer of Care Note  Patient: Lorraine Tran  Procedure(s) Performed: Procedure(s): INTRAMEDULLARY (IM) NAIL TIBIAL (Left) INTRAMEDULLARY (IM) NAIL TIBIAL (Right)  Patient Location: PACU  Anesthesia Type:General  Level of Consciousness: awake  Airway & Oxygen Therapy: Patient Spontanous Breathing and Patient connected to nasal cannula oxygen  Post-op Assessment: Report given to PACU RN and Post -op Vital signs reviewed and stable  Post vital signs: Reviewed and stable  Complications: No apparent anesthesia complications

## 2014-07-06 NOTE — Anesthesia Postprocedure Evaluation (Signed)
Anesthesia Post Note  Patient: Lorraine Tran  Procedure(s) Performed: Procedure(s) (LRB): INTRAMEDULLARY (IM) NAIL TIBIAL (Left) INTRAMEDULLARY (IM) NAIL TIBIAL (Right)  Anesthesia type: General  Patient location: PACU  Post pain: Pain level controlled and Adequate analgesia  Post assessment: Post-op Vital signs reviewed, Patient's Cardiovascular Status Stable, Respiratory Function Stable, Patent Airway and Pain level controlled  Last Vitals:  Filed Vitals:   07/06/14 1524  BP:   Pulse:   Temp: 36.5 C  Resp:     Post vital signs: Reviewed and stable  Level of consciousness: awake, alert  and oriented  Complications: No apparent anesthesia complications

## 2014-07-06 NOTE — Progress Notes (Signed)
Subjective:  She had a good night, no complaints except pain at fracture site, controlled with meds. Echo not yet done.  Objective:  Temp:  [98.1 F (36.7 C)-98.9 F (37.2 C)] 98.2 F (36.8 C) (09/13 0732) Pulse Rate:  [77-99] 86 (09/13 0409) Resp:  [16-20] 17 (09/13 0732) BP: (127-147)/(43-62) 146/62 mmHg (09/13 0732) SpO2:  [94 %-99 %] 97 % (09/13 0732) Weight change:   Intake/Output from previous day: 09/12 0701 - 09/13 0700 In: 2377.9 [P.O.:480; I.V.:1897.9] Out: 325 [Urine:325]  Intake/Output from this shift:    Medications: Current Facility-Administered Medications  Medication Dose Route Frequency Provider Last Rate Last Dose  . 0.9 %  sodium chloride infusion   Intravenous Continuous Domenic Polite, MD 75 mL/hr at 07/05/14 2118    . acetaminophen (TYLENOL) tablet 650 mg  650 mg Oral Q6H PRN Domenic Polite, MD      . antiseptic oral rinse (CPC / CETYLPYRIDINIUM CHLORIDE 0.05%) solution 7 mL  7 mL Mouth Rinse q12n4p Shanda Howells, MD   7 mL at 07/05/14 1600  . chlorhexidine (PERIDEX) 0.12 % solution 15 mL  15 mL Mouth Rinse BID Shanda Howells, MD   15 mL at 07/05/14 2100  . heparin injection 5,000 Units  5,000 Units Subcutaneous 3 times per day Shanda Howells, MD   5,000 Units at 07/06/14 0500  . HYDROmorphone (DILAUDID) injection 0.5-1 mg  0.5-1 mg Intravenous Q2H PRN Shanda Howells, MD   0.5 mg at 07/06/14 0555  . insulin aspart (novoLOG) injection 0-9 Units  0-9 Units Subcutaneous 6 times per day Shanda Howells, MD   1 Units at 07/06/14 0458  . insulin glargine (LANTUS) injection 10 Units  10 Units Subcutaneous QHS Domenic Polite, MD      . metoprolol tartrate (LOPRESSOR) tablet 25 mg  25 mg Oral BID Domenic Polite, MD   25 mg at 07/05/14 2117  . ondansetron (ZOFRAN) injection 4 mg  4 mg Intravenous Q6H PRN Domenic Polite, MD        Physical Exam: General: Alert, oriented x1, no distress, smiling pleasantly  Head: no evidence of trauma, PERRL, EOMI, no exophtalmos  or lid lag, no myxedema, no xanthelasma; normal ears, nose and oropharynx  Neck: normal jugular venous pulsations and no hepatojugular reflux; brisk carotid pulses without delay, bilateral loud transmitted carotid bruits  Chest: clear to auscultation, no signs of consolidation by percussion or palpation, normal fremitus, symmetrical and full respiratory excursions  Cardiovascular: normal position and quality of the apical impulse, regular rhythm, normal first heart sound and soft second heart sound, no rubs or gallops, 3/6 "honking" early to mid peaking aortic systolic murmur  Abdomen: no tenderness or distention, no masses by palpation, no abnormal pulsatility or arterial bruits, normal bowel sounds, no hepatosplenomegaly  Extremities: bruising both shins, no clubbing, cyanosis; no edema; 2+ radial, ulnar and brachial pulses bilaterally; 2+ right femoral, posterior tibial and dorsalis pedis pulses; 2+ left femoral, posterior tibial and dorsalis pedis pulses; no subclavian or femoral bruits  Neurological: grossly nonfocal   Lab Results: Results for orders placed during the hospital encounter of 07/04/14 (from the past 48 hour(s))  PROTIME-INR     Status: Abnormal   Collection Time    07/04/14  5:15 PM      Result Value Ref Range   Prothrombin Time 16.7 (*) 11.6 - 15.2 seconds   INR 1.35  0.00 - 1.49  CBC WITH DIFFERENTIAL     Status: Abnormal   Collection Time  07/04/14  5:15 PM      Result Value Ref Range   WBC 20.1 (*) 4.0 - 10.5 K/uL   RBC 3.83 (*) 3.87 - 5.11 MIL/uL   Hemoglobin 11.3 (*) 12.0 - 15.0 g/dL   HCT 33.7 (*) 36.0 - 46.0 %   MCV 88.0  78.0 - 100.0 fL   MCH 29.5  26.0 - 34.0 pg   MCHC 33.5  30.0 - 36.0 g/dL   RDW 13.8  11.5 - 15.5 %   Platelets 261  150 - 400 K/uL   Neutrophils Relative % 90 (*) 43 - 77 %   Neutro Abs 18.0 (*) 1.7 - 7.7 K/uL   Lymphocytes Relative 3 (*) 12 - 46 %   Lymphs Abs 0.7  0.7 - 4.0 K/uL   Monocytes Relative 7  3 - 12 %   Monocytes Absolute  1.4 (*) 0.1 - 1.0 K/uL   Eosinophils Relative 0  0 - 5 %   Eosinophils Absolute 0.0  0.0 - 0.7 K/uL   Basophils Relative 0  0 - 1 %   Basophils Absolute 0.0  0.0 - 0.1 K/uL  CK     Status: Abnormal   Collection Time    07/04/14  5:15 PM      Result Value Ref Range   Total CK 605 (*) 7 - 177 U/L  HEPATIC FUNCTION PANEL     Status: None   Collection Time    07/04/14  5:15 PM      Result Value Ref Range   Total Protein 6.0  6.0 - 8.3 g/dL   Albumin 3.5  3.5 - 5.2 g/dL   AST 29  0 - 37 U/L   ALT 21  0 - 35 U/L   Alkaline Phosphatase 47  39 - 117 U/L   Total Bilirubin 0.8  0.3 - 1.2 mg/dL   Bilirubin, Direct <0.2  0.0 - 0.3 mg/dL   Indirect Bilirubin NOT CALCULATED  0.3 - 0.9 mg/dL  CREATININE, SERUM     Status: Abnormal   Collection Time    07/04/14  5:15 PM      Result Value Ref Range   Creatinine, Ser 0.86  0.50 - 1.10 mg/dL   GFR calc non Af Amer 58 (*) >90 mL/min   GFR calc Af Amer 67 (*) >90 mL/min   Comment: (NOTE)     The eGFR has been calculated using the CKD EPI equation.     This calculation has not been validated in all clinical situations.     eGFR's persistently <90 mL/min signify possible Chronic Kidney     Disease.  HEMOGLOBIN A1C     Status: Abnormal   Collection Time    07/04/14  5:15 PM      Result Value Ref Range   Hemoglobin A1C 6.7 (*) <5.7 %   Comment: (NOTE)                                                                               According to the ADA Clinical Practice Recommendations for 2011, when     HbA1c is used as a screening test:      >=6.5%   Diagnostic  of Diabetes Mellitus               (if abnormal result is confirmed)     5.7-6.4%   Increased risk of developing Diabetes Mellitus     References:Diagnosis and Classification of Diabetes Mellitus,Diabetes     BPZW,2585,27(POEUM 1):S62-S69 and Standards of Medical Care in             Diabetes - 2011,Diabetes PNTI,1443,15 (Suppl 1):S11-S61.   Mean Plasma Glucose 146 (*) <117 mg/dL    Comment: Performed at [facility] MONITORING, ED     Status: Abnormal   Collection Time    07/04/14  5:16 PM      Result Value Ref Range   Glucose-Capillary 299 (*) 70 - 99 mg/dL   Comment 1 Documented in Chart     Comment 2 Notify RN    Randolm Idol, ED     Status: None   Collection Time    07/04/14  5:20 PM      Result Value Ref Range   Troponin i, poc 0.04  0.00 - 0.08 ng/mL   Comment 3            Comment: Due to the release kinetics of cTnI,     a negative result within the first hours     of the onset of symptoms does not rule out     myocardial infarction with certainty.     If myocardial infarction is still suspected,     repeat the test at appropriate intervals.  I-STAT CHEM 8, ED     Status: Abnormal   Collection Time    07/04/14  5:22 PM      Result Value Ref Range   Sodium 139  137 - 147 mEq/L   Potassium 4.2  3.7 - 5.3 mEq/L   Chloride 106  96 - 112 mEq/L   BUN 24 (*) 6 - 23 mg/dL   Creatinine, Ser 0.90  0.50 - 1.10 mg/dL   Glucose, Bld 353 (*) 70 - 99 mg/dL   Calcium, Ion 1.13  1.13 - 1.30 mmol/L   TCO2 19  0 - 100 mmol/L   Hemoglobin 11.9 (*) 12.0 - 15.0 g/dL   HCT 35.0 (*) 36.0 - 46.0 %  I-STAT CG4 LACTIC ACID, ED     Status: Abnormal   Collection Time    07/04/14  5:26 PM      Result Value Ref Range   Lactic Acid, Venous 4.60 (*) 0.5 - 2.2 mmol/L  LACTIC ACID, PLASMA     Status: Abnormal   Collection Time    07/04/14  8:47 PM      Result Value Ref Range   Lactic Acid, Venous 3.8 (*) 0.5 - 2.2 mmol/L  CK     Status: Abnormal   Collection Time    07/04/14  8:47 PM      Result Value Ref Range   Total CK 640 (*) 7 - 177 U/L  TROPONIN I     Status: None   Collection Time    07/04/14  8:47 PM      Result Value Ref Range   Troponin I <0.30  <0.30 ng/mL   Comment:            Due to the release kinetics of cTnI,     a negative result within the first hours     of the onset of symptoms does not rule out     myocardial infarction with  certainty.     If myocardial infarction is still suspected,     repeat the test at appropriate intervals.  CBG MONITORING, ED     Status: Abnormal   Collection Time    07/04/14  8:53 PM      Result Value Ref Range   Glucose-Capillary 287 (*) 70 - 99 mg/dL  I-STAT TROPOININ, ED     Status: None   Collection Time    07/04/14  9:07 PM      Result Value Ref Range   Troponin i, poc 0.07  0.00 - 0.08 ng/mL   Comment 3            Comment: Due to the release kinetics of cTnI,     a negative result within the first hours     of the onset of symptoms does not rule out     myocardial infarction with certainty.     If myocardial infarction is still suspected,     repeat the test at appropriate intervals.  MRSA PCR SCREENING     Status: None   Collection Time    07/04/14 10:36 PM      Result Value Ref Range   MRSA by PCR NEGATIVE  NEGATIVE   Comment:            The GeneXpert MRSA Assay (FDA     approved for NASAL specimens     only), is one component of a     comprehensive MRSA colonization     surveillance program. It is not     intended to diagnose MRSA     infection nor to guide or     monitor treatment for     MRSA infections.  GLUCOSE, CAPILLARY     Status: Abnormal   Collection Time    07/05/14 12:13 AM      Result Value Ref Range   Glucose-Capillary 272 (*) 70 - 99 mg/dL  COMPREHENSIVE METABOLIC PANEL     Status: Abnormal   Collection Time    07/05/14  3:50 AM      Result Value Ref Range   Sodium 145  137 - 147 mEq/L   Potassium 4.4  3.7 - 5.3 mEq/L   Chloride 113 (*) 96 - 112 mEq/L   CO2 19  19 - 32 mEq/L   Glucose, Bld 207 (*) 70 - 99 mg/dL   BUN 27 (*) 6 - 23 mg/dL   Creatinine, Ser 0.88  0.50 - 1.10 mg/dL   Calcium 8.1 (*) 8.4 - 10.5 mg/dL   Total Protein 5.0 (*) 6.0 - 8.3 g/dL   Albumin 2.7 (*) 3.5 - 5.2 g/dL   AST 26  0 - 37 U/L   ALT 18  0 - 35 U/L   Alkaline Phosphatase 36 (*) 39 - 117 U/L   Total Bilirubin 0.4  0.3 - 1.2 mg/dL   GFR calc non Af Amer 57 (*)  >90 mL/min   GFR calc Af Amer 66 (*) >90 mL/min   Comment: (NOTE)     The eGFR has been calculated using the CKD EPI equation.     This calculation has not been validated in all clinical situations.     eGFR's persistently <90 mL/min signify possible Chronic Kidney     Disease.   Anion gap 13  5 - 15  CBC WITH DIFFERENTIAL     Status: Abnormal   Collection Time    07/05/14  3:50 AM  Result Value Ref Range   WBC 13.5 (*) 4.0 - 10.5 K/uL   RBC 3.03 (*) 3.87 - 5.11 MIL/uL   Hemoglobin 8.9 (*) 12.0 - 15.0 g/dL   Comment: DELTA CHECK NOTED     REPEATED TO VERIFY     SPECIMEN CHECKED FOR CLOTS   HCT 26.3 (*) 36.0 - 46.0 %   MCV 86.8  78.0 - 100.0 fL   MCH 29.4  26.0 - 34.0 pg   MCHC 33.8  30.0 - 36.0 g/dL   RDW 14.1  11.5 - 15.5 %   Platelets 212  150 - 400 K/uL   Neutrophils Relative % 80 (*) 43 - 77 %   Neutro Abs 10.9 (*) 1.7 - 7.7 K/uL   Lymphocytes Relative 10 (*) 12 - 46 %   Lymphs Abs 1.3  0.7 - 4.0 K/uL   Monocytes Relative 10  3 - 12 %   Monocytes Absolute 1.3 (*) 0.1 - 1.0 K/uL   Eosinophils Relative 0  0 - 5 %   Eosinophils Absolute 0.0  0.0 - 0.7 K/uL   Basophils Relative 0  0 - 1 %   Basophils Absolute 0.0  0.0 - 0.1 K/uL  LACTIC ACID, PLASMA     Status: None   Collection Time    07/05/14  3:50 AM      Result Value Ref Range   Lactic Acid, Venous 1.6  0.5 - 2.2 mmol/L  CK     Status: Abnormal   Collection Time    07/05/14  3:50 AM      Result Value Ref Range   Total CK 678 (*) 7 - 177 U/L  TROPONIN I     Status: None   Collection Time    07/05/14  3:50 AM      Result Value Ref Range   Troponin I <0.30  <0.30 ng/mL   Comment:            Due to the release kinetics of cTnI,     a negative result within the first hours     of the onset of symptoms does not rule out     myocardial infarction with certainty.     If myocardial infarction is still suspected,     repeat the test at appropriate intervals.  GLUCOSE, CAPILLARY     Status: Abnormal   Collection  Time    07/05/14  5:07 AM      Result Value Ref Range   Glucose-Capillary 187 (*) 70 - 99 mg/dL  URINALYSIS, ROUTINE W REFLEX MICROSCOPIC     Status: Abnormal   Collection Time    07/05/14  6:36 AM      Result Value Ref Range   Color, Urine YELLOW  YELLOW   APPearance CLEAR  CLEAR   Specific Gravity, Urine 1.021  1.005 - 1.030   pH 5.0  5.0 - 8.0   Glucose, UA 500 (*) NEGATIVE mg/dL   Hgb urine dipstick NEGATIVE  NEGATIVE   Bilirubin Urine NEGATIVE  NEGATIVE   Ketones, ur 15 (*) NEGATIVE mg/dL   Protein, ur NEGATIVE  NEGATIVE mg/dL   Urobilinogen, UA 0.2  0.0 - 1.0 mg/dL   Nitrite NEGATIVE  NEGATIVE   Leukocytes, UA NEGATIVE  NEGATIVE   Comment: MICROSCOPIC NOT DONE ON URINES WITH NEGATIVE PROTEIN, BLOOD, LEUKOCYTES, NITRITE, OR GLUCOSE <1000 mg/dL.  GLUCOSE, CAPILLARY     Status: Abnormal   Collection Time    07/05/14  8:06 AM  Result Value Ref Range   Glucose-Capillary 178 (*) 70 - 99 mg/dL   Comment 1 Documented in Chart     Comment 2 Notify RN    TROPONIN I     Status: None   Collection Time    07/05/14  9:55 AM      Result Value Ref Range   Troponin I <0.30  <0.30 ng/mL   Comment:            Due to the release kinetics of cTnI,     a negative result within the first hours     of the onset of symptoms does not rule out     myocardial infarction with certainty.     If myocardial infarction is still suspected,     repeat the test at appropriate intervals.  VITAMIN B12     Status: None   Collection Time    07/05/14  9:55 AM      Result Value Ref Range   Vitamin B-12 288  211 - 911 pg/mL   Comment: Performed at [place]     Status: None   Collection Time    07/05/14  9:55 AM      Result Value Ref Range   Folate >20.0     Comment: (NOTE)     Reference Ranges            Deficient:       0.4 - 3.3 ng/mL            Indeterminate:   3.4 - 5.4 ng/mL            Normal:              > 5.4 ng/mL     Performed at [facility]  IRON AND  [facility]     Status: Abnormal   Collection Time    07/05/14  9:55 AM      Result Value Ref Range   Iron 14 (*) 42 - 135 ug/dL   TIBC 224 (*) 250 - 470 ug/dL   Saturation Ratios 6 (*) 20 - 55 %   UIBC 210  125 - 400 ug/dL   Comment: Performed at [place]     Status: None   Collection Time    07/05/14  9:55 AM      Result Value Ref Range   Ferritin 135  10 - 291 ng/mL   Comment: Performed at [facility]     Status: Abnormal   Collection Time    07/05/14  9:55 AM      Result Value Ref Range   Retic Ct Pct 1.5  0.4 - 3.1 %   RBC. 2.85 (*) 3.87 - 5.11 MIL/uL   Retic Count, Manual 42.8  19.0 - 186.0 K/uL  GLUCOSE, CAPILLARY     Status: Abnormal   Collection Time    07/05/14 11:49 AM      Result Value Ref Range   Glucose-Capillary 191 (*) 70 - 99 mg/dL   Comment 1 Documented in Chart     Comment 2 Notify RN    GLUCOSE, CAPILLARY     Status: Abnormal   Collection Time    07/05/14  7:40 PM      Result Value Ref Range   Glucose-Capillary 157 (*) 70 - 99 mg/dL   Comment 1 Notify RN    GLUCOSE, CAPILLARY     Status: Abnormal   Collection  Time    07/05/14 11:39 PM      Result Value Ref Range   Glucose-Capillary 180 (*) 70 - 99 mg/dL   Comment 1 Notify RN    GLUCOSE, CAPILLARY     Status: Abnormal   Collection Time    07/06/14  4:05 AM      Result Value Ref Range   Glucose-Capillary 150 (*) 70 - 99 mg/dL   Comment 1 Notify RN    COMPREHENSIVE METABOLIC PANEL     Status: Abnormal   Collection Time    07/06/14  5:00 AM      Result Value Ref Range   Sodium 139  137 - 147 mEq/L   Potassium 4.3  3.7 - 5.3 mEq/L   Chloride 106  96 - 112 mEq/L   CO2 21  19 - 32 mEq/L   Glucose, Bld 150 (*) 70 - 99 mg/dL   BUN 21  6 - 23 mg/dL   Creatinine, Ser 0.64  0.50 - 1.10 mg/dL   Calcium 7.9 (*) 8.4 - 10.5 mg/dL   Total Protein 5.0 (*) 6.0 - 8.3 g/dL   Albumin 2.5 (*) 3.5 - 5.2 g/dL   AST 39 (*) 0 - 37 U/L   ALT 20  0 - 35 U/L   Alkaline  Phosphatase 42  39 - 117 U/L   Total Bilirubin 0.4  0.3 - 1.2 mg/dL   GFR calc non Af Amer 77 (*) >90 mL/min   GFR calc Af Amer 89 (*) >90 mL/min   Comment: (NOTE)     The eGFR has been calculated using the CKD EPI equation.     This calculation has not been validated in all clinical situations.     eGFR's persistently <90 mL/min signify possible Chronic Kidney     Disease.   Anion gap 12  5 - 15  CBC WITH DIFFERENTIAL     Status: Abnormal   Collection Time    07/06/14  5:00 AM      Result Value Ref Range   WBC 13.9 (*) 4.0 - 10.5 K/uL   RBC 2.94 (*) 3.87 - 5.11 MIL/uL   Hemoglobin 8.5 (*) 12.0 - 15.0 g/dL   HCT 25.7 (*) 36.0 - 46.0 %   MCV 87.4  78.0 - 100.0 fL   MCH 28.9  26.0 - 34.0 pg   MCHC 33.1  30.0 - 36.0 g/dL   RDW 14.6  11.5 - 15.5 %   Platelets 198  150 - 400 K/uL   Neutrophils Relative % 75  43 - 77 %   Neutro Abs 10.5 (*) 1.7 - 7.7 K/uL   Lymphocytes Relative 12  12 - 46 %   Lymphs Abs 1.6  0.7 - 4.0 K/uL   Monocytes Relative 11  3 - 12 %   Monocytes Absolute 1.5 (*) 0.1 - 1.0 K/uL   Eosinophils Relative 2  0 - 5 %   Eosinophils Absolute 0.3  0.0 - 0.7 K/uL   Basophils Relative 0  0 - 1 %   Basophils Absolute 0.0  0.0 - 0.1 K/uL    Imaging: Imaging results have been reviewed and Dg Chest 1 View  07/04/2014   CLINICAL DATA:  Found down.  Altered mental status.  EXAM: CHEST - 1 VIEW  COMPARISON:  Chest x-ray 04/22/2012.  FINDINGS: Lung volumes are low. No consolidative airspace disease. No pleural effusions. No pneumothorax. No pulmonary nodule or mass noted. Pulmonary vasculature and the cardiomediastinal silhouette are within  normal limits. Atherosclerosis in the thoracic aorta. Mild bilateral apical pleural parenchymal thickening, presumably post infectious or inflammatory scarring (unchanged).  IMPRESSION: 1. Low lung volumes without radiographic evidence of acute cardiopulmonary disease. 2. Atherosclerosis.   Electronically Signed   By: Vinnie Langton M.D.   On:  07/04/2014 18:58   Dg Thoracic Spine 2 View  07/04/2014   CLINICAL DATA:  Patient fell.  Altered mental status.  EXAM: THORACIC SPINE - 2 VIEW  COMPARISON:  Chest x-rays dated 07/04/2014 and 04/22/2012  FINDINGS: There is a very slight anterior wedge deformity of the superior endplate of T4, age indeterminate. There is diffuse osteopenia. T1 is not well visualized on the available images.  Heart size is normal.  Suggestion of a moderate hiatal hernia.  IMPRESSION: Slight compression deformity of the superior endplate of T4, age indeterminate. T1 is not well visualized on the lateral view.   Electronically Signed   By: Rozetta Nunnery M.D.   On: 07/04/2014 19:02   Dg Lumbar Spine Complete  07/04/2014   CLINICAL DATA:  Found down.  Altered mental status.  EXAM: LUMBAR SPINE - COMPLETE 4+ VIEW  COMPARISON:  Lumbar spine MRI 04/23/2012.  FINDINGS: Old compression fracture of L1 with approximately 70% loss of anterior vertebral body height. No new acute displaced fractures or compression type fractures are otherwise noted. Multilevel degenerative disc disease, most severe at L5-S1. Severe multilevel facet arthropathy, most severe at L4-L5 and L5-S1. There is a 5 mm of anterolisthesis of L4 upon L5. 8 mm of anterolisthesis of L5 upon S1. Mild exaggeration of normal lumbar lordosis and mild levoscoliosis in the mid lumbar spine, but alignment is otherwise anatomic, although there is a mild acute kyphosis at the level of T12-L1, related to the chronic compression fracture. Extensive atherosclerosis.  IMPRESSION: 1. No acute findings. 2. Extensive multilevel degenerative disc disease, lumbar spondylosis, mild lumbar levoscoliosis, and old compression fracture at L2 are similar to prior examinations, as above.   Electronically Signed   By: Vinnie Langton M.D.   On: 07/04/2014 19:04   Dg Pelvis 1-2 Views  07/04/2014   CLINICAL DATA:  Patient found down.  EXAM: PELVIS - 1-2 VIEW  COMPARISON:  Leg radiographs today.   FINDINGS: Diffuse osteopenia. No displaced pelvic fracture. Atherosclerosis. Both hips are externally rotated. No displaced hip fracture is identified. Lumbar spondylosis. Moderate to large amount of stool in the rectosigmoid.  IMPRESSION: No acute osseous abnormality.   Electronically Signed   By: Dereck Ligas M.D.   On: 07/04/2014 19:01   Dg Tibia/fibula Left  07/04/2014   CLINICAL DATA:  Altered mental status.  Leg pain.  Found down.  EXAM: LEFT TIBIA AND FIBULA - 2 VIEW  COMPARISON:  None.  FINDINGS: Comminuted proximal tibial diaphysis fracture is present. 7 mm medial displacement of the medial cortical shard. Nonstandard projections are submitted. There also is a proximal to mid fibular shaft fracture with one shaft width medial displacement. Diffuse osteopenia. Atherosclerosis.  IMPRESSION: Study degraded by nonstandard projections. Mildly displaced comminuted proximal tibial shaft fracture. Proximal to mid fibular shaft fracture with 1 shaft width medial displacement.   Electronically Signed   By: Dereck Ligas M.D.   On: 07/04/2014 18:58   Dg Ankle Complete Left  07/04/2014   CLINICAL DATA:  Left ankle swelling.  EXAM: LEFT ANKLE COMPLETE - 3+ VIEW  COMPARISON:  None.  FINDINGS: An oblique calcaneal fracture is identified extending from the subtalar joint to the inferior surface.  No other  fracture, subluxation or dislocation identified.  Mild degenerative changes at the tibiotalar joint noted.  Heavy vascular calcifications are identified.  IMPRESSION: Intraarticular calcaneal fracture extending from the subtalar joint to the inferior surface.   Electronically Signed   By: Hassan Rowan M.D.   On: 07/04/2014 19:02   Ct Head Wo Contrast  07/04/2014   CLINICAL DATA:  Fall.  Found down.  Confused.  EXAM: CT HEAD WITHOUT CONTRAST  CT CERVICAL SPINE WITHOUT CONTRAST  TECHNIQUE: Multidetector CT imaging of the head and cervical spine was performed following the standard protocol without intravenous  contrast. Multiplanar CT image reconstructions of the cervical spine were also generated.  COMPARISON:  CT head and cervical spine 04/22/2012  FINDINGS: CT HEAD FINDINGS  Stable extensive encephalomalacia in the parietal lobes bilaterally and in the left occipital lobe, suggestive of prior infarctions. Stable chronic hypodensity in the periventricular white matter bilaterally. Remote basal ganglia infarcts bilaterally are unchanged. Diffuse cerebral volume loss noted. Ventricles are stable in size. Negative for intra or extra-axial hemorrhage, evidence of hydrocephalus, or CT findings of acute cortically based infarction.  Extensive atherosclerotic calcification in the visualized vertebral arteries and the visualized intracranial internal carotid arteries. Skull is intact. Visualized paranasal sinuses and mastoid air cells are clear.  CT CERVICAL SPINE FINDINGS  Cervical spine is normally aligned from the skullbase through the cervicothoracic junction. Moderate disc height narrowing at C4-5, C5-6, and C6-7 is without significant change. Posterior osseous spurring and anterior osteophyte formation is seen at each of these levels. There is also mild posterior osseous spurring at C3-C4. Facet joint degenerative changes are noted bilaterally at C3-C4 and C4-C5.  Incomplete fusion of the posterior arch of C1 is again noted.  Moderate osseous neural foraminal narrowing on the right at C3-C4. Mild bilateral neural foraminal narrowing at C4-C5. Mild bilateral neural foraminal narrowing at C5-C6. Mild bilateral neural foraminal narrowing at C6-C7.  IMPRESSION: 1. No acute intracranial abnormality identified. Extensive chronic remote cerebral infarcts and chronic white matter ischemic changes. 2. Chronic cervical spondylosis. 3. No evidence of acute bony injury to the cervical spine.   Electronically Signed   By: Curlene Dolphin M.D.   On: 07/04/2014 18:24   Ct Cervical Spine Wo Contrast  07/04/2014   CLINICAL DATA:  Patient  found down.  Confusion.  EXAM: CT CERVICAL SPINE WITHOUT CONTRAST  TECHNIQUE: Multidetector CT imaging of the cervical spine was performed without intravenous contrast. Multiplanar CT image reconstructions were also generated.  COMPARISON:  Cervical spine CT scan 04/22/2012.  FINDINGS: No acute displaced fractures in the cervical spine. Severe multilevel degenerative disc disease again noted, most pronounced at C6-C7. Moderate multilevel facet arthropathy. Alignment is anatomic. Prevertebral soft tissues are normal. Incidental note is again made of incomplete fusion of the posterior arch of C1 (normal variant). Incidental imaging of the upper thorax is unremarkable.  IMPRESSION: 1. No acute abnormality of the cervical spine. 2. Severe multilevel degenerative disc disease and cervical spondylosis again noted, as above.   Electronically Signed   By: Vinnie Langton M.D.   On: 07/04/2014 18:18   Mr Brain Wo Contrast  07/04/2014   CLINICAL DATA:  Fall, encephalopathy.  EXAM: MRI HEAD WITHOUT CONTRAST  TECHNIQUE: Multiplanar, multiecho pulse sequences of the brain and surrounding structures were obtained without intravenous contrast.  COMPARISON:  Prior CT from earlier the same day as well as prior MRI from 04/23/2012.  FINDINGS: Study is degraded by motion artifact.  Diffuse prominence of the CSF containing spaces is  compatible with advanced generalized cerebral atrophy. Encephalomalacia within the left occipital lobe is compatible with remote left PCA territory infarct, stable from prior. Remote right parietal-occipital infarct is unchanged. Additional remote lacunar infarcts involving the left basal ganglia also unchanged. Small remote lacunar infarct within the posterior right centrum semi ovale. Advanced chronic microvascular ischemic changes again noted, stable from prior.  No diffusion-weighted signal abnormality to suggest acute intracranial infarct. Gray-white matter differentiation maintained. No acute  intracranial hemorrhage.  No mass lesion or midline shift. Ventricular prominence related to global atrophy present. No hydrocephalus. No extra-axial fluid collection.  Craniocervical junction widely patent. Degenerative spondylolysis noted within the upper cervical spine. Pituitary gland grossly unremarkable. No acute abnormality seen about either orbit.  Calvarium and scalp soft tissues within normal limits.  Paranasal sinuses clear.  No mastoid effusion.  IMPRESSION: 1. No acute intracranial infarct or other abnormality identified. 2. Global atrophy with chronic small vessel ischemic disease and remote infarcts as above.   Electronically Signed   By: Jeannine Boga M.D.   On: 07/04/2014 23:22    Assessment:  Active Problems:   HTN (hypertension)   Aortic stenosis   Dementia   Atrial fibrillation   Fall   Leg fracture, left   Encephalopathy   Hyperglycemia   Plan:  1. Moderate surgical risk, but other than possible postop atrial fibrillation the risk of major complications is not too high 2. If AF with RVR occurs, would temporarily increase beta blocker dose. If BP too low, IV amiodarone is a good alternative 3. Continue beta blocker uninterrupted 4. Resume anticoagulation (which can serve as DVT/PE prophylaxis also) as soon as deemed safe from a surgical standpoint.  Time Spent Directly with Patient:  15 minutes  Length of Stay:  LOS: 2 days    Dereke Neumann 07/06/2014, 8:07 AM

## 2014-07-06 NOTE — Progress Notes (Signed)
[facility] PROGRESS NOTE  Lorraine Tran ZOX:[id number] DOB: 02/04/1922 DOA: 07/04/2014 PCP: Darrow Bussing, MD  Assessment/Plan: 1-Fall/Encephalopathy  -Likely multifactorial with contributions including dementia, hyperglycemia, dehydration  -gentle IVF, MRI without acute findings  -UA /CXR unremarkable -lactic acid improving  2- Fractures of L tib fib and calcaneus  -ortho following, for IM rod/nail today per Dr.Blackman -eliquus on hold -pain control   3-DM/Hyperglycemia  -glipizide and januvia on hold -started low dose lantus -SSI, A1C 6.7   4-Afib  -resumed metoprolol, hold eliquis  -resume post op  5. Diffuse ST depressions on EKG -loud systolic murmur -2DECHO pending -cardiac enzymes negative -cleared for surgery per Cards with minimal CV risks  6. Anemia -drop in Hb most likley due to hemodilution, got IVF bolus and 100cc/hr on admission -cut down IVF -anemia panel c/w iron defi, hemoccult stool pending -transfuse if <7  7-Leukocytosis  -likely reactive in setting of above  -FU blood and urine culture -negative thus far  8. Dementia -not on meds  FEN/GI: NPO   Prophylaxis: sub q heparin  Code Status: DNR Family Communication: d/w daughter at bedside Disposition Plan: will need SNF   Consultants:  Cards  Ortho  HPI/Subjective: More lucid this am, pain when moves leg  Objective: Filed Vitals:   07/06/14 0732  BP: 146/62  Pulse:   Temp: 98.2 F (36.8 C)  Resp: 17    Intake/Output Summary (Last 24 hours) at 07/06/14 0830 Last data filed at 07/06/14 0700  Gross per 24 hour  Intake 2377.92 ml  Output    325 ml  Net 2052.92 ml   Filed Weights   07/04/14 2220 07/05/14 0400  Weight: 57.5 kg (126 lb 12.2 oz) 58.5 kg (128 lb 15.5 oz)    Exam:   General:  Alert, awake but disoriented, except to person  Cardiovascular: S1S2/RRR, systolic murmur noted  Respiratory: CTAB  Abdomen: soft, Nt, BS present  Musculoskeletal:  L leg in brace from knee down  Data Reviewed: Basic Metabolic Panel:  Recent Labs Lab 07/04/14 1715 07/04/14 1722 07/05/14 0350 07/06/14 0500  NA  --  139 145 139  K  --  4.2 4.4 4.3  CL  --  106 113* 106  CO2  --   --  19 21  GLUCOSE  --  353* 207* 150*  BUN  --  24* 27* 21  CREATININE 0.86 0.90 0.88 0.64  CALCIUM  --   --  8.1* 7.9*   Liver Function Tests:  Recent Labs Lab 07/04/14 1715 07/05/14 0350 07/06/14 0500  AST 29 26 39*  ALT ALKPHOS 47 36* 42  BILITOT 0.8 0.4 0.4  PROT 6.0 5.0* 5.0*  ALBUMIN 3.5 2.7* 2.5*   No results found for this basename: LIPASE, AMYLASE,  in the last 168 hours No results found for this basename: AMMONIA,  in the last 168 hours CBC:  Recent Labs Lab 07/04/14 1715 07/04/14 1722 07/05/14 0350 07/06/14 0500  WBC 20.1*  --  13.5* 13.9*  NEUTROABS 18.0*  --  10.9* 10.5*  HGB 11.3* 11.9* 8.9* 8.5*  HCT 33.7* 35.0* 26.3* 25.7*  MCV 88.0  --  86.8 87.4  PLT 261  --  212 198   Cardiac Enzymes:  Recent Labs Lab 07/04/14 1715 07/04/14 2047 07/05/14 0350 07/05/14 0955  CKTOTAL 605* 640* 678*  --   TROPONINI  --  <0.30 <0.30 <0.30   BNP (last 3 results) No results found for this basename: PROBNP,  in  the last 8760 hours CBG:  Recent Labs Lab 07/05/14 1149 07/05/14 1940 07/05/14 2339 07/06/14 0405 07/06/14 0742  GLUCAP 191* 157* 180* 150* 162*    Recent Results (from the past 240 hour(s))  MRSA PCR SCREENING     Status: None   Collection Time    07/04/14 10:36 PM      Result Value Ref Range Status   MRSA by PCR NEGATIVE  NEGATIVE Final   Comment:            The GeneXpert MRSA Assay (FDA     approved for NASAL specimens     only), is one component of a     comprehensive MRSA colonization     surveillance program. It is not     intended to diagnose MRSA     infection nor to guide or     monitor treatment for     MRSA infections.     Studies: Dg Chest 1 View  07/04/2014   CLINICAL DATA:  Found  down.  Altered mental status.  EXAM: CHEST - 1 VIEW  COMPARISON:  Chest x-ray 04/22/2012.  FINDINGS: Lung volumes are low. No consolidative airspace disease. No pleural effusions. No pneumothorax. No pulmonary nodule or mass noted. Pulmonary vasculature and the cardiomediastinal silhouette are within normal limits. Atherosclerosis in the thoracic aorta. Mild bilateral apical pleural parenchymal thickening, presumably post infectious or inflammatory scarring (unchanged).  IMPRESSION: 1. Low lung volumes without radiographic evidence of acute cardiopulmonary disease. 2. Atherosclerosis.   Electronically Signed   By: Trudie Reed M.D.   On: 07/04/2014 18:58   Dg Thoracic Spine 2 View  07/04/2014   CLINICAL DATA:  Patient fell.  Altered mental status.  EXAM: THORACIC SPINE - 2 VIEW  COMPARISON:  Chest x-rays dated 07/04/2014 and 04/22/2012  FINDINGS: There is a very slight anterior wedge deformity of the superior endplate of T4, age indeterminate. There is diffuse osteopenia. T1 is not well visualized on the available images.  Heart size is normal.  Suggestion of a moderate hiatal hernia.  IMPRESSION: Slight compression deformity of the superior endplate of T4, age indeterminate. T1 is not well visualized on the lateral view.   Electronically Signed   By: Geanie Cooley M.D.   On: 07/04/2014 19:02   Dg Lumbar Spine Complete  07/04/2014   CLINICAL DATA:  Found down.  Altered mental status.  EXAM: LUMBAR SPINE - COMPLETE 4+ VIEW  COMPARISON:  Lumbar spine MRI 04/23/2012.  FINDINGS: Old compression fracture of L1 with approximately 70% loss of anterior vertebral body height. No new acute displaced fractures or compression type fractures are otherwise noted. Multilevel degenerative disc disease, most severe at L5-S1. Severe multilevel facet arthropathy, most severe at L4-L5 and L5-S1. There is a 5 mm of anterolisthesis of L4 upon L5. 8 mm of anterolisthesis of L5 upon S1. Mild exaggeration of normal lumbar lordosis  and mild levoscoliosis in the mid lumbar spine, but alignment is otherwise anatomic, although there is a mild acute kyphosis at the level of T12-L1, related to the chronic compression fracture. Extensive atherosclerosis.  IMPRESSION: 1. No acute findings. 2. Extensive multilevel degenerative disc disease, lumbar spondylosis, mild lumbar levoscoliosis, and old compression fracture at L2 are similar to prior examinations, as above.   Electronically Signed   By: Trudie Reed M.D.   On: 07/04/2014 19:04   Dg Pelvis 1-2 Views  07/04/2014   CLINICAL DATA:  Patient found down.  EXAM: PELVIS - 1-2 VIEW  COMPARISON:  Leg radiographs today.  FINDINGS: Diffuse osteopenia. No displaced pelvic fracture. Atherosclerosis. Both hips are externally rotated. No displaced hip fracture is identified. Lumbar spondylosis. Moderate to large amount of stool in the rectosigmoid.  IMPRESSION: No acute osseous abnormality.   Electronically Signed   By: Andreas Newport M.D.   On: 07/04/2014 19:01   Dg Tibia/fibula Left  07/04/2014   CLINICAL DATA:  Altered mental status.  Leg pain.  Found down.  EXAM: LEFT TIBIA AND FIBULA - 2 VIEW  COMPARISON:  None.  FINDINGS: Comminuted proximal tibial diaphysis fracture is present. 7 mm medial displacement of the medial cortical shard. Nonstandard projections are submitted. There also is a proximal to mid fibular shaft fracture with one shaft width medial displacement. Diffuse osteopenia. Atherosclerosis.  IMPRESSION: Study degraded by nonstandard projections. Mildly displaced comminuted proximal tibial shaft fracture. Proximal to mid fibular shaft fracture with 1 shaft width medial displacement.   Electronically Signed   By: Andreas Newport M.D.   On: 07/04/2014 18:58   Dg Ankle Complete Left  07/04/2014   CLINICAL DATA:  Left ankle swelling.  EXAM: LEFT ANKLE COMPLETE - 3+ VIEW  COMPARISON:  None.  FINDINGS: An oblique calcaneal fracture is identified extending from the subtalar joint to the  inferior surface.  No other fracture, subluxation or dislocation identified.  Mild degenerative changes at the tibiotalar joint noted.  Heavy vascular calcifications are identified.  IMPRESSION: Intraarticular calcaneal fracture extending from the subtalar joint to the inferior surface.   Electronically Signed   By: Laveda Abbe M.D.   On: 07/04/2014 19:02   Ct Head Wo Contrast  07/04/2014   CLINICAL DATA:  Fall.  Found down.  Confused.  EXAM: CT HEAD WITHOUT CONTRAST  CT CERVICAL SPINE WITHOUT CONTRAST  TECHNIQUE: Multidetector CT imaging of the head and cervical spine was performed following the standard protocol without intravenous contrast. Multiplanar CT image reconstructions of the cervical spine were also generated.  COMPARISON:  CT head and cervical spine 04/22/2012  FINDINGS: CT HEAD FINDINGS  Stable extensive encephalomalacia in the parietal lobes bilaterally and in the left occipital lobe, suggestive of prior infarctions. Stable chronic hypodensity in the periventricular white matter bilaterally. Remote basal ganglia infarcts bilaterally are unchanged. Diffuse cerebral volume loss noted. Ventricles are stable in size. Negative for intra or extra-axial hemorrhage, evidence of hydrocephalus, or CT findings of acute cortically based infarction.  Extensive atherosclerotic calcification in the visualized vertebral arteries and the visualized intracranial internal carotid arteries. Skull is intact. Visualized paranasal sinuses and mastoid air cells are clear.  CT CERVICAL SPINE FINDINGS  Cervical spine is normally aligned from the skullbase through the cervicothoracic junction. Moderate disc height narrowing at C4-5, C5-6, and C6-7 is without significant change. Posterior osseous spurring and anterior osteophyte formation is seen at each of these levels. There is also mild posterior osseous spurring at C3-C4. Facet joint degenerative changes are noted bilaterally at C3-C4 and C4-C5.  Incomplete fusion of the  posterior arch of C1 is again noted.  Moderate osseous neural foraminal narrowing on the right at C3-C4. Mild bilateral neural foraminal narrowing at C4-C5. Mild bilateral neural foraminal narrowing at C5-C6. Mild bilateral neural foraminal narrowing at C6-C7.  IMPRESSION: 1. No acute intracranial abnormality identified. Extensive chronic remote cerebral infarcts and chronic white matter ischemic changes. 2. Chronic cervical spondylosis. 3. No evidence of acute bony injury to the cervical spine.   Electronically Signed   By: Britta Mccreedy M.D.   On: 07/04/2014 18:24  Ct Cervical Spine Wo Contrast  07/04/2014   CLINICAL DATA:  Patient found down.  Confusion.  EXAM: CT CERVICAL SPINE WITHOUT CONTRAST  TECHNIQUE: Multidetector CT imaging of the cervical spine was performed without intravenous contrast. Multiplanar CT image reconstructions were also generated.  COMPARISON:  Cervical spine CT scan 04/22/2012.  FINDINGS: No acute displaced fractures in the cervical spine. Severe multilevel degenerative disc disease again noted, most pronounced at C6-C7. Moderate multilevel facet arthropathy. Alignment is anatomic. Prevertebral soft tissues are normal. Incidental note is again made of incomplete fusion of the posterior arch of C1 (normal variant). Incidental imaging of the upper thorax is unremarkable.  IMPRESSION: 1. No acute abnormality of the cervical spine. 2. Severe multilevel degenerative disc disease and cervical spondylosis again noted, as above.   Electronically Signed   By: Trudie Reed M.D.   On: 07/04/2014 18:18   Mr Brain Wo Contrast  07/04/2014   CLINICAL DATA:  Fall, encephalopathy.  EXAM: MRI HEAD WITHOUT CONTRAST  TECHNIQUE: Multiplanar, multiecho pulse sequences of the brain and surrounding structures were obtained without intravenous contrast.  COMPARISON:  Prior CT from earlier the same day as well as prior MRI from 04/23/2012.  FINDINGS: Study is degraded by motion artifact.  Diffuse  prominence of the CSF containing spaces is compatible with advanced generalized cerebral atrophy. Encephalomalacia within the left occipital lobe is compatible with remote left PCA territory infarct, stable from prior. Remote right parietal-occipital infarct is unchanged. Additional remote lacunar infarcts involving the left basal ganglia also unchanged. Small remote lacunar infarct within the posterior right centrum semi ovale. Advanced chronic microvascular ischemic changes again noted, stable from prior.  No diffusion-weighted signal abnormality to suggest acute intracranial infarct. Gray-white matter differentiation maintained. No acute intracranial hemorrhage.  No mass lesion or midline shift. Ventricular prominence related to global atrophy present. No hydrocephalus. No extra-axial fluid collection.  Craniocervical junction widely patent. Degenerative spondylolysis noted within the upper cervical spine. Pituitary gland grossly unremarkable. No acute abnormality seen about either orbit.  Calvarium and scalp soft tissues within normal limits.  Paranasal sinuses clear.  No mastoid effusion.  IMPRESSION: 1. No acute intracranial infarct or other abnormality identified. 2. Global atrophy with chronic small vessel ischemic disease and remote infarcts as above.   Electronically Signed   By: Rise Mu M.D.   On: 07/04/2014 23:22    Scheduled Meds: . antiseptic oral rinse  7 mL Mouth Rinse q12n4p  . chlorhexidine  15 mL Mouth Rinse BID  . heparin  5,000 Units Subcutaneous 3 times per day  . insulin aspart  0-9 Units Subcutaneous 6 times per day  . insulin glargine  10 Units Subcutaneous QHS  . metoprolol tartrate  25 mg Oral BID   Continuous Infusions: . sodium chloride 75 mL/hr at 07/05/14 2118   Antibiotics Given (last 72 hours)   None      Active Problems:   HTN (hypertension)   Aortic stenosis   Dementia   Atrial fibrillation   Fall   Leg fracture, left   Encephalopathy    Hyperglycemia    Time spent:    [facility]  [facility] Pager [phone]. If 7PM-7AM, please contact night-coverage at [email or web address], password [facility] 07/06/2014, 8:30 AM  LOS: 2 days

## 2014-07-06 NOTE — Progress Notes (Signed)
PT Cancellation Note  Patient Details Name: ISOBELLE TUCKETT MRN: [id number] DOB: 1922/05/24   Cancelled Treatment:    Reason Eval/Treat Not Completed: Patient not medically ready.  Note plans for OR today.  Will await postop order to proceed with PT eval.  Please indicate weight bearing status postop.  Thank you,   Dennis Bast 07/06/2014, 8:48 AM

## 2014-07-07 ENCOUNTER — Encounter (HOSPITAL_COMMUNITY): Payer: Self-pay | Admitting: Orthopaedic Surgery

## 2014-07-07 LAB — COMPREHENSIVE METABOLIC PANEL
ALT: 23 U/L (ref 0–35)
AST: 30 U/L (ref 0–37)
Albumin: 2.4 g/dL — ABNORMAL LOW (ref 3.5–5.2)
Alkaline Phosphatase: 44 U/L (ref 39–117)
Anion gap: 11 (ref 5–15)
BILIRUBIN TOTAL: 1.1 mg/dL (ref 0.3–1.2)
BUN: 13 mg/dL (ref 6–23)
CHLORIDE: 105 meq/L (ref 96–112)
CO2: 25 meq/L (ref 19–32)
CREATININE: 0.56 mg/dL (ref 0.50–1.10)
Calcium: 7.8 mg/dL — ABNORMAL LOW (ref 8.4–10.5)
GFR calc Af Amer: >90 mL/min (ref >90)
GFR, EST NON AFRICAN AMERICAN: 80 mL/min — AB (ref >90)
GLUCOSE: 106 mg/dL — AB (ref 70–99)
Potassium: 3.9 mEq/L (ref 3.7–5.3)
Sodium: 141 mEq/L (ref 137–147)
Total Protein: 5 g/dL — ABNORMAL LOW (ref 6.0–8.3)

## 2014-07-07 LAB — GLUCOSE, CAPILLARY
GLUCOSE-CAPILLARY: 150 mg/dL — AB (ref 70–99)
GLUCOSE-CAPILLARY: 231 mg/dL — AB (ref 70–99)
Glucose-Capillary: 119 mg/dL — ABNORMAL HIGH (ref 70–99)
Glucose-Capillary: 194 mg/dL — ABNORMAL HIGH (ref 70–99)
Glucose-Capillary: 244 mg/dL — ABNORMAL HIGH (ref 70–99)
Glucose-Capillary: 245 mg/dL — ABNORMAL HIGH (ref 70–99)

## 2014-07-07 LAB — CBC WITH DIFFERENTIAL/PLATELET
BASOS ABS: 0 10*3/uL (ref 0.0–0.1)
Basophils Relative: 0 % (ref 0–1)
EOS ABS: 0.1 10*3/uL (ref 0.0–0.7)
EOS PCT: 1 % (ref 0–5)
HCT: 32.2 % — ABNORMAL LOW (ref 36.0–46.0)
Hemoglobin: 11.2 g/dL — ABNORMAL LOW (ref 12.0–15.0)
LYMPHS PCT: 9 % — AB (ref 12–46)
Lymphs Abs: 1.1 10*3/uL (ref 0.7–4.0)
MCH: 29.6 pg (ref 26.0–34.0)
MCHC: 34.8 g/dL (ref 30.0–36.0)
MCV: 85.2 fL (ref 78.0–100.0)
Monocytes Absolute: 1.6 10*3/uL — ABNORMAL HIGH (ref 0.1–1.0)
Monocytes Relative: 13 % — ABNORMAL HIGH (ref 3–12)
Neutro Abs: 9.6 10*3/uL — ABNORMAL HIGH (ref 1.7–7.7)
Neutrophils Relative %: 77 % (ref 43–77)
PLATELETS: 146 10*3/uL — AB (ref 150–400)
RBC: 3.78 MIL/uL — ABNORMAL LOW (ref 3.87–5.11)
RDW: 14.2 % (ref 11.5–15.5)
WBC: 12.4 10*3/uL — ABNORMAL HIGH (ref 4.0–10.5)

## 2014-07-07 LAB — URINE CULTURE: Colony Count: 45000

## 2014-07-07 MED ORDER — HYDROMORPHONE HCL PF 1 MG/ML IJ SOLN
0.5000 mg | INTRAMUSCULAR | Status: DC | PRN
  Administered 2014-07-08: 0.5 mg via INTRAVENOUS
  Filled 2014-07-07 (×2): qty 1

## 2014-07-07 MED ORDER — PNEUMOCOCCAL VAC POLYVALENT 25 MCG/0.5ML IJ INJ: 0.5000 mL | INJECTION | INTRAMUSCULAR | Status: DC

## 2014-07-07 MED ORDER — INSULIN GLARGINE 100 UNIT/ML ~~LOC~~ SOLN
20.0000 [IU] | Freq: Every day | SUBCUTANEOUS | Status: DC
  Administered 2014-07-08 (×2): 20 [IU] via SUBCUTANEOUS
  Filled 2014-07-07 (×7): qty 0.2

## 2014-07-07 MED ORDER — INFLUENZA VAC SPLIT QUAD 0.5 ML IM SUSY
0.5000 mL | PREFILLED_SYRINGE | INTRAMUSCULAR | Status: AC
  Administered 2014-07-09: 0.5 mL via INTRAMUSCULAR
  Filled 2014-07-07 (×2): qty 0.5

## 2014-07-07 NOTE — Clinical Social Work Placement (Addendum)
Clinical Social Work Department CLINICAL SOCIAL WORK PLACEMENT NOTE 07/07/2014  Patient:  Lorraine Tran, Lorraine Tran  Account Number:  [id number] Admit date:  07/04/2014  Clinical Social Worker:  Gwynne Edinger, LCSWA  Date/time:  07/07/2014 02:47 PM  Clinical Social Work is seeking post-discharge placement for this patient at the following level of care:   SKILLED NURSING   (*CSW will update this form in Epic as items are completed)   07/07/2014  Patient/family provided with [facility] Department of Clinical Social Work's list of facilities offering this level of care within the geographic area requested by the patient (or if unable, by the patient's family).  07/07/2014  Patient/family informed of their freedom to choose among providers that offer the needed level of care, that participate in Medicare, Medicaid or managed care program needed by the patient, have an available bed and are willing to accept the patient.  07/07/2014  Patient/family informed of [facility]' ownership interest in [facility], as well as of the fact that they are under no obligation to receive care at this facility.  PASARR submitted to EDS on  PASARR number received on   FL2 transmitted to all facilities in geographic area requested by pt/family on  07/07/2014 FL2 transmitted to all facilities within larger geographic area on 07/07/2014  Patient informed that his/her managed care company has contracts with or will negotiate with  certain facilities, including the following:     Patient/family informed of bed offers received:  07/08/2014 Patient chooses bed at [facility]  Physician recommends and patient chooses bed at    Patient to be transferred to  [facility] on  07/10/15 [facility] [place], [place], [phone]) Patient to be transferred to facility by [facility] Patient and family notified of transfer on 07/09/14 [facility] [place], [place], [phone]) Name of family member notified:  Annabell Eileen [place], [employer],  [phone])  The following physician request were entered in Epic:   Additional Comments: Pt has a pervious PASSAR.  Dysheka Bibbs, MSW,LCSWA [phone]

## 2014-07-07 NOTE — Progress Notes (Signed)
[facility] PROGRESS NOTE  Lorraine Tran:[id number] DOB: [birth year] DOA: 07/04/2014 PCP: Darrow Bussing, MD  Assessment/Plan: 1-Fall/Encephalopathy  -Likely multifactorial with contributions including dementia, hyperglycemia, dehydration  -stop IVF, MRI without acute findings  -UA /CXR unremarkable, all cultures negative -lactic acid improved  2- Fractures of L tib fib and calcaneus and R distal tibia Fx -ortho following, s/p Bilateral IM rod/nail 9/13 per Dr.Blackman -Per Ortho she will be non-weight bearing on her legs for the next 4- 6 weeks -eliquis resumed per Ortho -pain control  -incentive spirometry  3-Post op blood loss anemia and hemodilution -s/p 2units PRBC transfusion intra/peri op -CBC in am -Also Iron defi anemia based on panel  4. DM/Hyperglycemia  -glipizide and januvia on hold -increase lantus -SSI, A1C 6.7   4-Afib  -resumed metoprolol, eliquis resumed  5. Moderate AS  -noted on ECHO -cardiac enzymes negative -continue metoprolol  6-Leukocytosis  -likely reactive in setting of above  -FU blood and urine culture -negative thus far  7. Dementia -not on meds  Prophylaxis: on apixaban  Code Status: DNR Family Communication: d/w daughter at bedside 9/13 Disposition Plan: will need SNF   Consultants:  Cards  Ortho  HPI/Subjective: No complaints, pleasantly confused  Objective: Filed Vitals:   07/07/14 0708  BP: 174/61  Pulse: 100  Temp: 97.9 F (36.6 C)  Resp:     Intake/Output Summary (Last 24 hours) at 07/07/14 0848 Last data filed at 07/07/14 8119  Gross per 24 hour  Intake   3145 ml  Output   2090 ml  Net   1055 ml   Filed Weights   07/04/14 2220 07/05/14 0400  Weight: 57.5 kg (126 lb 12.2 oz) 58.5 kg (128 lb 15.5 oz)    Exam:   General:  Alert, awake but disoriented, except to person  Cardiovascular: S1S2/RRR, systolic murmur noted  Respiratory: CTAB  Abdomen: soft, Nt, BS  present  Musculoskeletal: R and L leg in brace from knee down  Data Reviewed: Basic Metabolic Panel:  Recent Labs Lab 07/04/14 1715 07/04/14 1722 07/05/14 0350 07/06/14 0500 07/06/14 1243 07/07/14 0620  NA  --  139 145 139 140 141  K  --  4.2 4.4 4.3 4.2 3.9  CL  --  106 113* 106  --  105  CO2  --   --  19 21  --  25  GLUCOSE  --  353* 207* 150* 227* 106*  BUN  --  24* 27* 21  --  13  CREATININE 0.86 0.90 0.88 0.64  --  0.56  CALCIUM  --   --  8.1* 7.9*  --  7.8*   Liver Function Tests:  Recent Labs Lab 07/04/14 1715 07/05/14 0350 07/06/14 0500 07/07/14 0620  AST 29 26 39* 30  ALT ALKPHOS 47 36* 42 44  BILITOT 0.8 0.4 0.4 1.1  PROT 6.0 5.0* 5.0* 5.0*  ALBUMIN 3.5 2.7* 2.5* 2.4*   No results found for this basename: LIPASE, AMYLASE,  in the last 168 hours No results found for this basename: AMMONIA,  in the last 168 hours CBC:  Recent Labs Lab 07/04/14 1715 07/04/14 1722 07/05/14 0350 07/06/14 0500 07/06/14 1243 07/07/14 0620  WBC 20.1*  --  13.5* 13.9*  --  12.4*  NEUTROABS 18.0*  --  10.9* 10.5*  --  9.6*  HGB 11.3* 11.9* 8.9* 8.5* 6.5* 11.2*  HCT 33.7* 35.0* 26.3* 25.7* 19.0* 32.2*  MCV 88.0  --  86.8 87.4  --  85.2  PLT 261  --  212 198  --  146*   Cardiac Enzymes:  Recent Labs Lab 07/04/14 1715 07/04/14 2047 07/05/14 0350 07/05/14 0955  CKTOTAL 605* 640* 678*  --   TROPONINI  --  <0.30 <0.30 <0.30   BNP (last 3 results) No results found for this basename: PROBNP,  in the last 8760 hours CBG:  Recent Labs Lab 07/06/14 2009 07/06/14 2154 07/06/14 2339 07/07/14 0342 07/07/14 0808  GLUCAP 254* 244* 245* 150* 119*    Recent Results (from the past 240 hour(s))  CULTURE, BLOOD (ROUTINE X 2)     Status: None   Collection Time    07/04/14  8:54 PM      Result Value Ref Range Status   Specimen Description BLOOD ARM RIGHT   Final   Special Requests BOTTLES DRAWN AEROBIC AND ANAEROBIC 10CC   Final   Culture  Setup Time      Final   Value: 07/05/2014 04:25     Performed at [facility]   Culture     Final   Value:        BLOOD CULTURE RECEIVED NO GROWTH TO DATE CULTURE WILL BE HELD FOR 5 DAYS BEFORE ISSUING A FINAL NEGATIVE REPORT     Performed at [facility]   Report Status PENDING   Incomplete  CULTURE, BLOOD (ROUTINE X 2)     Status: None   Collection Time    07/04/14  9:01 PM      Result Value Ref Range Status   Specimen Description BLOOD FOREARM RIGHT   Final   Special Requests BOTTLES DRAWN AEROBIC AND ANAEROBIC B 10CC R 5CC   Final   Culture  Setup Time     Final   Value: 07/05/2014 04:24     Performed at [facility]   Culture     Final   Value:        BLOOD CULTURE RECEIVED NO GROWTH TO DATE CULTURE WILL BE HELD FOR 5 DAYS BEFORE ISSUING A FINAL NEGATIVE REPORT     Performed at [facility]   Report Status PENDING   Incomplete  MRSA PCR SCREENING     Status: None   Collection Time    07/04/14 10:36 PM      Result Value Ref Range Status   MRSA by PCR NEGATIVE  NEGATIVE Final   Comment:            The GeneXpert MRSA Assay (FDA     approved for NASAL specimens     only), is one component of a     comprehensive MRSA colonization     surveillance program. It is not     intended to diagnose MRSA     infection nor to guide or     monitor treatment for     MRSA infections.     Studies: Dg Tibia/fibula Left  07/06/2014   CLINICAL DATA:  ORIF full or comminuted proximal tibia shaft fracture.  EXAM: LEFT TIBIA AND FIBULA - 2 VIEW  COMPARISON:  07/04/2014  FINDINGS: Intraoperative images demonstrate intramedullary nail placement in the tibia, with 2 proximal locking screws and 2 distal locking screws. Fracture fragments are in near anatomic alignment.  IMPRESSION: ORIF of comminuted left proximal tibia fracture.   Electronically Signed   By: Britta Mccreedy M.D.   On: 07/06/2014 14:14   Dg Tibia/fibula Right  07/06/2014   CLINICAL DATA:  Right tibial nail.  EXAM:  RIGHT TIBIA AND FIBULA - 2 VIEW; DG C-ARM 61-120 MIN  COMPARISON:  No priors.  FINDINGS: 7 intraoperative fluoroscopic spot views of the right tibia document placement of an intramedullary nail traversing a spiral fracture of the distal right tibial diaphysis. Restoration of anatomic alignment is been achieved.  IMPRESSION: Intraoperative documentation of intramedullary nail fixation for right tibial fracture.   Electronically Signed   By: Trudie Reed M.D.   On: 07/06/2014 14:15   Dg C-arm 1-60 Min  07/06/2014   CLINICAL DATA:  Right tibial nail.  EXAM: RIGHT TIBIA AND FIBULA - 2 VIEW; DG C-ARM 61-120 MIN  COMPARISON:  No priors.  FINDINGS: 7 intraoperative fluoroscopic spot views of the right tibia document placement of an intramedullary nail traversing a spiral fracture of the distal right tibial diaphysis. Restoration of anatomic alignment is been achieved.  IMPRESSION: Intraoperative documentation of intramedullary nail fixation for right tibial fracture.   Electronically Signed   By: Trudie Reed M.D.   On: 07/06/2014 14:15   Dg C-arm 1-60 Min  07/06/2014   CLINICAL DATA:  Tibial fracture.  ORIF.  EXAM: DG C-ARM 61-120 MIN  TECHNIQUE: Series of intraoperative fluoroscopic spot views  FINDINGS: Antegrade LEFT tibial nail with proximal and distal interlocking screws.  IMPRESSION: ORIF of the tibia.   Electronically Signed   By: Andreas Newport M.D.   On: 07/06/2014 14:14    Scheduled Meds: . antiseptic oral rinse  7 mL Mouth Rinse q12n4p  . apixaban  2.5 mg Oral BID  . chlorhexidine  15 mL Mouth Rinse BID  . [START ON 07/08/2014] Influenza vac split quadrivalent PF  0.5 mL Intramuscular Tomorrow-1000  . insulin aspart  0-9 Units Subcutaneous 6 times per day  . insulin glargine  10 Units Subcutaneous QHS  . metoprolol tartrate  25 mg Oral BID   Continuous Infusions:   Antibiotics Given (last 72 hours)   Date/Time Action Medication Dose   07/06/14 1140 Given   clindamycin (CLEOCIN)  600 MG/50ML IVPB 600 mg      Active Problems:   HTN (hypertension)   Aortic stenosis   Dementia   Atrial fibrillation   Fall   Leg fracture, left   Encephalopathy   Hyperglycemia    Time spent:    [facility]  [facility] Pager [phone]. If 7PM-7AM, please contact night-coverage at [email or web address], password [facility] 07/07/2014, 8:48 AM  LOS: 3 days

## 2014-07-07 NOTE — Clinical Social Work Note (Signed)
CSW left a voice mail for the pt's daughter. CSW is awaiting a call back from the daughter so we can discuss SNF placement.   Cheresa Siers, MSW, LCSWA [phone]

## 2014-07-07 NOTE — Evaluation (Addendum)
Physical Therapy Evaluation Patient Details Name: Lorraine Tran MRN: [id number] DOB: [birth year] Today's Date: 07/07/2014   History of Present Illness  Pt presents s/p B and tibial intramedullary nail placement on 07/06/14 2/2 L tibfib fx, L calcaneal fx, and R tibial fx. Pt with hx of fall, afib, uncontrolled type 2 DM, HTN, and dementia.  Clinical Impression  Pt demostrates reduced independence with mobility, requiring total A for transfers and bed mobility. Pt inconsistently able to follow simple single-step commands, consistency improves with demonstration. Pt would benefit from continued skilled PT to improve functional status. Will continue to treat as indicated and progress as tolerated.    Follow Up Recommendations SNF    Equipment Recommendations  Wheelchair cushion (measurements PT);Wheelchair (measurements PT)    Recommendations for Other Services       Precautions / Restrictions Precautions Precautions: Fall Restrictions Weight Bearing Restrictions: Yes LLE Weight Bearing: Non weight bearing  RLE Weight Bearing: Non weight bearing     Mobility  Bed Mobility Overal bed mobility: Needs Assistance Bed Mobility: Rolling;Supine to Sit Rolling: Total assist   Supine to sit: Total assist     General bed mobility comments: Cueing for hand placement unsuccessful, requires A for placement. Pt's pain level appears to increase with any movement of LE  Transfers Overall transfer level: Needs assistance   Transfers: Squat Pivot Transfers     Squat pivot transfers: Total assist;+2 safety/equipment     General transfer comment: +2 A to prevent WB through L LE. Pt's pain appears to increase with any movement of LE  Ambulation/Gait                Stairs            Wheelchair Mobility    Modified Rankin (Stroke Patients Only)       Balance Overall balance assessment: Needs assistance;History of Falls Sitting-balance support: Bilateral upper  extremity supported Sitting balance-Leahy Scale: Zero Sitting balance - Comments: Pt demonstrates posterior lean with sitting EOB, requires frequent cueing and A to prevent from falling backwards.                                      Pertinent Vitals/Pain Pain Assessment: Faces Faces Pain Scale: Hurts whole lot (With movement of B LE.) Pain Descriptors / Indicators: Grimacing;Moaning    Home Living Family/patient expects to be discharged to:: Private residence Living Arrangements: Spouse/significant other Available Help at Discharge: Family Type of Home: House           Additional Comments: Pt unable to provide details 2/2 cognitive impairments    Prior Function           Comments: Pt unable to provide details 2/2 cognitive impairments     Hand Dominance        Extremity/Trunk Assessment   Upper Extremity Assessment: Defer to OT evaluation           Lower Extremity Assessment: RLE deficits/detail;LLE deficits/detail         Communication   Communication: HOH  Cognition Arousal/Alertness: Awake/alert   Overall Cognitive Status: Impaired/Different from baseline Area of Impairment: Orientation;Following commands;Memory;Attention;Safety/judgement Orientation Level: Person   Memory: Decreased short-term memory Following Commands: Follows one step commands inconsistently;Follows one step commands with increased time Safety/Judgement: Decreased awareness of deficits     General Comments: Pt knows she is in [place] and [place], but thought she was at home. Pt  unable to tell date or year. Pt inconsistently able to follow simple commands, improved consistency with demonstration. Pt demonstrates difficulty with answering hx questions. When asked if she lives with anyone, she reports that she lives alone but another member of her care team reqorts she lives wiith her spouse.     General Comments General comments (skin integrity, edema, etc.): Pt  with B LE bandaging. Due to cognitive status, pt unable to participate in education or goal setting. Pt appears to have some confusion regarding her current condtion. No family members present to assist in pt hx, d/c plan, or education regarding pt's condition.     Exercises        Assessment/Plan    PT Assessment Patient needs continued PT services  PT Diagnosis Acute pain;Generalized weakness   PT Problem List Decreased balance;Decreased cognition;Pain;Decreased activity tolerance;Decreased mobility  PT Treatment Interventions Functional mobility training;Therapeutic activities;Balance training;Therapeutic exercise;Patient/family education   PT Goals (Current goals can be found in the Care Plan section) Acute Rehab PT Goals PT Goal Formulation: Patient unable to participate in goal setting Time For Goal Achievement: 07/21/14 Potential to Achieve Goals: Fair    Frequency Min 2X/week   Barriers to discharge        Co-evaluation               End of Session Equipment Utilized During Treatment: Gait belt;Oxygen Activity Tolerance: Patient limited by pain Patient left: in chair;with call bell/phone within reach;with chair alarm set Nurse Communication: Mobility status         Time: 0865-7846 PT Time Calculation (min): 22 min   Charges:   PT Evaluation $Initial PT Evaluation Tier I: 1 Procedure PT Treatments $Therapeutic Activity: 8-22 mins   PT G Codes:          Charlotte Crumb, PT DPT  [phone]

## 2014-07-07 NOTE — Progress Notes (Signed)
OT Cancellation Note  Patient Details Name: Lorraine Tran MRN: [id number] DOB: [birth year]   Cancelled Treatment:    Reason Eval/Treat Not Completed: OT screened. will sign off.  Pt is Medicare and current D/C plan is SNF. No apparent immediate acute care OT needs, therefore will defer OT to SNF. If OT eval is needed please call Acute Rehab Dept. at [phone] or text page OT at [phone].    Earlie Raveling  OTR/L [phone]  07/07/2014, 10:28 AM

## 2014-07-07 NOTE — Care Management Note (Unsigned)
    Page 1 of 1   07/07/2014     2:49:40 PM CARE MANAGEMENT NOTE 07/07/2014  Patient:  Lorraine Tran, Lorraine Tran   Account Number:  [id number]  Date Initiated:  07/07/2014  Documentation initiated by:  GRAVES-BIGELOW,Penina Reisner  Subjective/Objective Assessment:   Pt admitted for fall, leg fracture, encephalopathy, hyperglycemia, dehydration, leukocytosis.  S/p Bilateral and tibial intramedullary nail placement. Pt is from home with husband.     Action/Plan:   CM will continue to monitor. CSW assisting with possible SNF placement.   Anticipated DC Date:  07/09/2014   Anticipated DC Plan:  SKILLED NURSING FACILITY  In-house referral  Clinical Social Worker      DC Planning Services  CM consult      Choice offered to / List presented to:             Status of service:  In process, will continue to follow Medicare Important Message given?  YES (If response is "NO", the following Medicare IM given date fields will be blank) Date Medicare IM given:  07/07/2014 Medicare IM given by:  GRAVES-BIGELOW,Arrielle Mcginn Date Additional Medicare IM given:   Additional Medicare IM given by:    Discharge Disposition:    Per UR Regulation:  Reviewed for med. necessity/level of care/duration of stay  If discussed at Long Length of Stay Meetings, dates discussed:    Comments:

## 2014-07-07 NOTE — Progress Notes (Signed)
80 y.o. female with a past medical history significant for moderate AS, paroxysmal atrial fibrillation on Eliquis, history of strokes, DM, dementia and a recent fall with left tibial/fibular fracture. History of neurally mediated syncope in remote past, but current event is suspected to be a mechanical fall.  AS appears to be asymptomatic, albeit with a very sedentary lifestyle. Echo 2013 showed moderate AS (mean grad 26 mm Hg, AVA 1.01 cm sq)      Subjective: Recovering post op  Objective: Vital signs in last 24 hours: Temp:  [97.6 F (36.4 C)-98.1 F (36.7 C)] 97.9 F (36.6 C) (09/14 0708) Pulse Rate:  [85-100] 100 (09/14 0708) Resp:  [15-22] 18 (09/14 0346) BP: (146-178)/(50-72) 174/61 mmHg (09/14 0708) SpO2:  [90 %-100 %] 98 % (09/14 0708) Weight change:    Intake/Output from previous day:  +1630 (+3707 since admit) 09/13 0701 - 09/14 0700 In: 3145 [I.V.:2475; Blood:670] Out: 1590 [Urine:1440; Blood:150] Intake/Output this shift: Total I/O In: -  Out: 500 [Urine:500]  PE: Per MD General:Pleasant affect, NAD Skin:Warm and dry, brisk capillary refill HEENT:normocephalic, sclera clear, mucus membranes moist Neck:supple, no JVD, no bruits  Heart:S1S2 RRR without murmur, gallup, rub or click Lungs:clear without rales, rhonchi, or wheezes ZOX:WRUE, non tender, + BS, do not palpate liver spleen or masses Ext:no lower ext edema, 2+ pedal pulses, 2+ radial pulses Neuro:alert and oriented, MAE, follows commands, + facial symmetry  tele:  Short burst of PAF vs. PAT   Lab Results:  Recent Labs  07/06/14 0500 07/06/14 1243 07/07/14 0620  WBC 13.9*  --  12.4*  HGB 8.5* 6.5* 11.2*  HCT 25.7* 19.0* 32.2*  PLT 198  --  146*   BMET  Recent Labs  07/06/14 0500 07/06/14 1243 07/07/14 0620  NA 139 140 141  K 4.3 4.2 3.9  CL 106  --  105  CO2 21  --  25  GLUCOSE 150* 227* 106*  BUN 21  --  13  CREATININE 0.64  --  0.56  CALCIUM 7.9*  --  7.8*    Recent  Labs  07/05/14 0350 07/05/14 0955  TROPONINI <0.30 <0.30    Lab Results  Component Value Date   CHOL 228* 04/23/2012   HDL 51 04/23/2012   LDLCALC 454* 04/23/2012   TRIG 120 04/23/2012   CHOLHDL 4.5 04/23/2012   Lab Results  Component Value Date   HGBA1C 6.7* 07/04/2014     No results found for this basename: TSH    Hepatic Function Panel  Recent Labs  07/04/14 1715  07/07/14 0620  PROT 6.0  < > 5.0*  ALBUMIN 3.5  < > 2.4*  AST 29  < > 30  ALT 21  < > 23  ALKPHOS 47  < > 44  BILITOT 0.8  < > 1.1  BILIDIR <0.2  --   --   IBILI NOT CALCULATED  --   --   < > = values in this interval not displayed. No results found for this basename: CHOL,  in the last 72 hours No results found for this basename: PROTIME,  in the last 72 hours     Studies/Results: Dg Tibia/fibula Left  07/06/2014   CLINICAL DATA:  ORIF full or comminuted proximal tibia shaft fracture.  EXAM: LEFT TIBIA AND FIBULA - 2 VIEW  COMPARISON:  07/04/2014  FINDINGS: Intraoperative images demonstrate intramedullary nail placement in the tibia, with 2 proximal locking screws and 2 distal locking screws. Fracture  fragments are in near anatomic alignment.  IMPRESSION: ORIF of comminuted left proximal tibia fracture.   Electronically Signed   By: Britta Mccreedy M.D.   On: 07/06/2014 14:14   Dg Tibia/fibula Right  07/06/2014   CLINICAL DATA:  Right tibial nail.  EXAM: RIGHT TIBIA AND FIBULA - 2 VIEW; DG C-ARM 61-120 MIN  COMPARISON:  No priors.  FINDINGS: 7 intraoperative fluoroscopic spot views of the right tibia document placement of an intramedullary nail traversing a spiral fracture of the distal right tibial diaphysis. Restoration of anatomic alignment is been achieved.  IMPRESSION: Intraoperative documentation of intramedullary nail fixation for right tibial fracture.   Electronically Signed   By: Trudie Reed M.D.   On: 07/06/2014 14:15   Dg C-arm 1-60 Min  07/06/2014   CLINICAL DATA:  Right tibial nail.  EXAM: RIGHT  TIBIA AND FIBULA - 2 VIEW; DG C-ARM 61-120 MIN  COMPARISON:  No priors.  FINDINGS: 7 intraoperative fluoroscopic spot views of the right tibia document placement of an intramedullary nail traversing a spiral fracture of the distal right tibial diaphysis. Restoration of anatomic alignment is been achieved.  IMPRESSION: Intraoperative documentation of intramedullary nail fixation for right tibial fracture.   Electronically Signed   By: Trudie Reed M.D.   On: 07/06/2014 14:15   Dg C-arm 1-60 Min  07/06/2014   CLINICAL DATA:  Tibial fracture.  ORIF.  EXAM: DG C-ARM 61-120 MIN  TECHNIQUE: Series of intraoperative fluoroscopic spot views  FINDINGS: Antegrade LEFT tibial nail with proximal and distal interlocking screws.  IMPRESSION: ORIF of the tibia.   Electronically Signed   By: Andreas Newport M.D.   On: 07/06/2014 14:14    ECHO:  07/06/14 Left ventricle: The cavity size was normal. There was mild concentric hypertrophy. Systolic function was vigorous. The estimated ejection fraction was in the range of 65% to 70%. Doppler parameters are consistent with abnormal left ventricular relaxation (grade 1 diastolic dysfunction). Doppler parameters are consistent with high ventricular filling pressure. - Aortic valve: Cusp separation was reduced. There was moderate stenosis. Mean gradient (S): 20 mm Hg. - Mitral valve: Moderately calcified annulus. - Right ventricle: The cavity size was mildly dilated. Wall thickness was normal. - Right atrium: The atrium was mildly dilated. - Pulmonary arteries: Systolic pressure was moderately increased. PA peak pressure: 60 mm Hg (S).  Impressions:  - Compared to the prior study, there has been no significant interval change.    Medications: I have reviewed the meds. Scheduled Meds: . antiseptic oral rinse  7 mL Mouth Rinse q12n4p  . apixaban  2.5 mg Oral BID  . chlorhexidine  15 mL Mouth Rinse BID  . [START ON 07/08/2014] Influenza vac split  quadrivalent PF  0.5 mL Intramuscular Tomorrow-1000  . insulin aspart  0-9 Units Subcutaneous 6 times per day  . insulin glargine  20 Units Subcutaneous QHS  . metoprolol tartrate  25 mg Oral BID   Continuous Infusions:  PRN Meds:.acetaminophen, HYDROmorphone (DILAUDID) injection, ondansetron (ZOFRAN) IV   Assessment/Plan: Principal Problem:   Leg fracture, left and right- s/p IM nail to Lt tibia and IM nail to Rt tibia 07/06/14 POD 1 Active Problems:   HTN (hypertension)   Aortic stenosis, mod to severe, EF 65-70%   Dementia   Atrial fibrillation- maintaining SR with brief episode of 15 beats of PAT perhaps PAF rate of 145   Fall   Encephalopathy   Hyperglycemia    LOS: 3 days   Time spent with  pt. :15 minutes. [facility]  Nurse Practitioner Certified Pager [phone] or after 5pm and on weekends call [phone] 07/07/2014, 9:14 AM   I have seen and examined the patient along with Nada Boozer, NP.  I have reviewed the chart, notes and new data.  I agree with NP's note.  Key new complaints: pain well controlled, no cardiac complaints Key examination changes:  General: Alert, oriented x1, no distress, smiling pleasantly  Head: no evidence of trauma, PERRL, EOMI, no exophtalmos or lid lag, no myxedema, no xanthelasma; normal ears, nose and oropharynx  Neck: normal jugular venous pulsations and no hepatojugular reflux; brisk carotid pulses without delay, bilateral loud transmitted carotid bruits  Chest: clear to auscultation, no signs of consolidation by percussion or palpation, normal fremitus, symmetrical and full respiratory excursions  Cardiovascular: normal position and quality of the apical impulse, regular rhythm, normal first heart sound and soft second heart sound, no rubs or gallops, 3/6 "honking" early to mid peaking aortic systolic murmur  Abdomen: no tenderness or distention, no masses by palpation, no abnormal pulsatility or arterial bruits, normal bowel sounds, no  hepatosplenomegaly  Extremities: both legs wrapped in bandages; 2+ radial, ulnar and brachial pulses bilaterally; 2+ right femoral, posterior tibial and dorsalis pedis pulses; 2+ left femoral, posterior tibial and dorsalis pedis pulses; no subclavian or femoral bruits  Neurological: grossly nonfocal  Key new findings / data: brief run of nonsustained atrial tachycardia  PLAN: Resuming Eliquis today. No new recommendations.  Thurmon Fair, MD, [facility] [phone] 07/07/2014, 10:00 AM

## 2014-07-07 NOTE — Progress Notes (Signed)
Patient ID: Lorraine Tran, female   DOB: 1922-10-15, 80 y.o.   MRN: [id number] Stable post-op.  Did find a non-displaced right tibia fracture as well at the time of surgery.  This was discussed in detail with her daughter and bilateral tibial rods/nails were placed to stabilize her tibia fractures.  Unfortunately, she will be non-weight bearing on her legs for the next 4- 6 weeks.  She can put weight on her right leg to pivot into a wheelchair.

## 2014-07-07 NOTE — Progress Notes (Signed)
Utilization review completed.  

## 2014-07-07 NOTE — ED Provider Notes (Signed)
I have personally seen and examined the patient.  I have discussed the plan of care with the resident.  I have reviewed the documentation on PMH/FH/Soc. History.  I have reviewed the documentation of the resident and agree.   Joya Gaskins, MD 07/07/14 [phone]

## 2014-07-07 NOTE — Clinical Social Work Psychosocial (Signed)
Clinical Social Work Department BRIEF PSYCHOSOCIAL ASSESSMENT 07/07/2014  Patient:  Lorraine Tran, Lorraine Tran     Account Number:  [id number]     Admit date:  07/04/2014  Clinical Social Worker:  Bo Mcclintock  Date/Time:  07/07/2014 02:40 PM  Referred by:  RN  Date Referred:  07/07/2014 Referred for  SNF Placement   Other Referral:   Interview type:  Family  Other interview type:    PSYCHOSOCIAL DATA Living Status:  HUSBAND Admitted from facility:   Level of care:  Skilled Nursing Facility Primary support name:  Shawn Route Primary support relationship to patient:  CHILD, ADULT Degree of support available:   good    CURRENT CONCERNS  Other Concerns:   "I just want my mother to get better."    SOCIAL WORK ASSESSMENT / PLAN CSW received call back from pt's daughter Annebell. CSW introduce self and purpose of visit. CSW and pt's daughter discussed the clinical team recommendations for rehab. CSW explained the SNF process to the pt. CSW and pt's daughter talked about SNF list.  CSW provided the pt with contact information for further questions.   Assessment/plan status:  Information/Referral to [facility] Other assessment/ plan:   Information/referral to community resources:    PATIENT'S/FAMILY'S RESPONSE TO PLAN OF CARE: good      Lynette Topete, MSW, LCSWA [phone]

## 2014-07-08 LAB — CBC WITH DIFFERENTIAL/PLATELET
Basophils Absolute: 0 10*3/uL (ref 0.0–0.1)
Basophils Relative: 0 % (ref 0–1)
EOS ABS: 0.1 10*3/uL (ref 0.0–0.7)
EOS PCT: 1 % (ref 0–5)
HEMATOCRIT: 30.2 % — AB (ref 36.0–46.0)
Hemoglobin: 10.6 g/dL — ABNORMAL LOW (ref 12.0–15.0)
Lymphocytes Relative: 10 % — ABNORMAL LOW (ref 12–46)
Lymphs Abs: 1 10*3/uL (ref 0.7–4.0)
MCH: 29.4 pg (ref 26.0–34.0)
MCHC: 35.1 g/dL (ref 30.0–36.0)
MCV: 83.9 fL (ref 78.0–100.0)
Monocytes Absolute: 1.3 10*3/uL — ABNORMAL HIGH (ref 0.1–1.0)
Monocytes Relative: 13 % — ABNORMAL HIGH (ref 3–12)
Neutro Abs: 7.1 10*3/uL (ref 1.7–7.7)
Neutrophils Relative %: 76 % (ref 43–77)
Platelets: 161 10*3/uL (ref 150–400)
RBC: 3.6 MIL/uL — AB (ref 3.87–5.11)
RDW: 14.2 % (ref 11.5–15.5)
WBC: 9.5 10*3/uL (ref 4.0–10.5)

## 2014-07-08 LAB — COMPREHENSIVE METABOLIC PANEL
ALT: 23 U/L (ref 0–35)
AST: 32 U/L (ref 0–37)
Albumin: 2.1 g/dL — ABNORMAL LOW (ref 3.5–5.2)
Alkaline Phosphatase: 46 U/L (ref 39–117)
Anion gap: 11 (ref 5–15)
BUN: 14 mg/dL (ref 6–23)
CALCIUM: 7.6 mg/dL — AB (ref 8.4–10.5)
CO2: 25 meq/L (ref 19–32)
Chloride: 101 mEq/L (ref 96–112)
Creatinine, Ser: 0.58 mg/dL (ref 0.50–1.10)
GFR calc Af Amer: >90 mL/min (ref >90)
GFR calc non Af Amer: 79 mL/min — ABNORMAL LOW (ref >90)
Glucose, Bld: 160 mg/dL — ABNORMAL HIGH (ref 70–99)
Potassium: 3.4 mEq/L — ABNORMAL LOW (ref 3.7–5.3)
SODIUM: 137 meq/L (ref 137–147)
Total Bilirubin: 0.9 mg/dL (ref 0.3–1.2)
Total Protein: 4.8 g/dL — ABNORMAL LOW (ref 6.0–8.3)

## 2014-07-08 LAB — GLUCOSE, CAPILLARY
GLUCOSE-CAPILLARY: 128 mg/dL — AB (ref 70–99)
GLUCOSE-CAPILLARY: 155 mg/dL — AB (ref 70–99)
Glucose-Capillary: 151 mg/dL — ABNORMAL HIGH (ref 70–99)
Glucose-Capillary: 182 mg/dL — ABNORMAL HIGH (ref 70–99)
Glucose-Capillary: 190 mg/dL — ABNORMAL HIGH (ref 70–99)
Glucose-Capillary: 240 mg/dL — ABNORMAL HIGH (ref 70–99)

## 2014-07-08 MED ORDER — INSULIN ASPART 100 UNIT/ML ~~LOC~~ SOLN
0.0000 [IU] | Freq: Three times a day (TID) | SUBCUTANEOUS | Status: DC
  Administered 2014-07-09: 2 [IU] via SUBCUTANEOUS

## 2014-07-08 NOTE — Progress Notes (Signed)
Patient Name: Lorraine Tran Date of Encounter: 07/08/2014  Principal Problem:   Leg fracture, left Active Problems:   HTN (hypertension)   Aortic stenosis   Dementia   Atrial fibrillation   Fall   Encephalopathy   Hyperglycemia   Length of Stay: 4  SUBJECTIVE  Comfortable. No CHF No arrhythmia  CURRENT MEDS . antiseptic oral rinse  7 mL Mouth Rinse q12n4p  . apixaban  2.5 mg Oral BID  . chlorhexidine  15 mL Mouth Rinse BID  . Influenza vac split quadrivalent PF  0.5 mL Intramuscular Tomorrow-1000  . insulin aspart  0-9 Units Subcutaneous 6 times per day  . insulin glargine  20 Units Subcutaneous QHS  . metoprolol tartrate  25 mg Oral BID    OBJECTIVE   Intake/Output Summary (Last 24 hours) at 07/08/14 1109 Last data filed at 07/08/14 0404  Gross per 24 hour  Intake    600 ml  Output      0 ml  Net    600 ml   Filed Weights   07/04/14 2220 07/05/14 0400  Weight: 126 lb 12.2 oz (57.5 kg) 128 lb 15.5 oz (58.5 kg)    PHYSICAL EXAM Filed Vitals:   07/08/14 0404 07/08/14 0855 07/08/14 0900 07/08/14 1030  BP: 158/133 148/46  143/45  Pulse: 86 92  66  Temp: 98.4 F (36.9 C)  98.6 F (37 C) 98.6 F (37 C)  TempSrc: Oral  Oral Oral  Resp:      Height:      Weight:      SpO2: 96% 97%  99%   General: Alert, oriented x3, no distress Head: no evidence of trauma, PERRL, EOMI, no exophtalmos or lid lag, no myxedema, no xanthelasma; normal ears, nose and oropharynx Neck: normal jugular venous pulsations and no hepatojugular reflux; brisk carotid pulses without delay and no carotid bruits Chest: clear to auscultation, no signs of consolidation by percussion or palpation, normal fremitus, symmetrical and full respiratory excursions Cardiovascular: normal position and quality of the apical impulse, regular rhythm, normal first and second heart sounds, no rubs or gallops, 2-3/6 systolic ejection murmur Abdomen: no tenderness or distention, no masses by palpation, no  abnormal pulsatility or arterial bruits, normal bowel sounds, no hepatosplenomegaly Extremities: no clubbing, cyanosis or edema; 2+ radial, ulnar and brachial pulses bilaterally; 2+ right femoral, posterior tibial and dorsalis pedis pulses; 2+ left femoral, posterior tibial and dorsalis pedis pulses; no subclavian or femoral bruits Neurological: grossly nonfocal  LABS  CBC  Recent Labs  07/07/14 0620 07/08/14 0247  WBC 12.4* 9.5  NEUTROABS 9.6* 7.1  HGB 11.2* 10.6*  HCT 32.2* 30.2*  MCV 85.2 83.9  PLT 146* 161   Basic Metabolic Panel  Recent Labs  07/07/14 0620 07/08/14 0247  NA 141 137  K 3.9 3.4*  CL 105 101  CO2 25 25  GLUCOSE 106* 160*  BUN 13 14  CREATININE 0.56 0.58  CALCIUM 7.8* 7.6*   Liver Function Tests  Recent Labs  07/07/14 0620 07/08/14 0247  AST 30 32  ALT 23 23  ALKPHOS 44 46  BILITOT 1.1 0.9  PROT 5.0* 4.8*  ALBUMIN 2.4* 2.1*   No results found for this basename: LIPASE, AMYLASE,  in the last 72 hours  Radiology Studies Imaging results have been reviewed and Dg Tibia/fibula Left  07/06/2014   CLINICAL DATA:  ORIF full or comminuted proximal tibia shaft fracture.  EXAM: LEFT TIBIA AND FIBULA - 2 VIEW  COMPARISON:  07/04/2014  FINDINGS: Intraoperative images demonstrate intramedullary nail placement in the tibia, with 2 proximal locking screws and 2 distal locking screws. Fracture fragments are in near anatomic alignment.  IMPRESSION: ORIF of comminuted left proximal tibia fracture.   Electronically Signed   By: Britta Mccreedy M.D.   On: 07/06/2014 14:14   Dg Tibia/fibula Right  07/06/2014   CLINICAL DATA:  Right tibial nail.  EXAM: RIGHT TIBIA AND FIBULA - 2 VIEW; DG C-ARM 61-120 MIN  COMPARISON:  No priors.  FINDINGS: 7 intraoperative fluoroscopic spot views of the right tibia document placement of an intramedullary nail traversing a spiral fracture of the distal right tibial diaphysis. Restoration of anatomic alignment is been achieved.   IMPRESSION: Intraoperative documentation of intramedullary nail fixation for right tibial fracture.   Electronically Signed   By: Trudie Reed M.D.   On: 07/06/2014 14:15   Dg C-arm 1-60 Min  07/06/2014   CLINICAL DATA:  Right tibial nail.  EXAM: RIGHT TIBIA AND FIBULA - 2 VIEW; DG C-ARM 61-120 MIN  COMPARISON:  No priors.  FINDINGS: 7 intraoperative fluoroscopic spot views of the right tibia document placement of an intramedullary nail traversing a spiral fracture of the distal right tibial diaphysis. Restoration of anatomic alignment is been achieved.  IMPRESSION: Intraoperative documentation of intramedullary nail fixation for right tibial fracture.   Electronically Signed   By: Trudie Reed M.D.   On: 07/06/2014 14:15   Dg C-arm 1-60 Min  07/06/2014   CLINICAL DATA:  Tibial fracture.  ORIF.  EXAM: DG C-ARM 61-120 MIN  TECHNIQUE: Series of intraoperative fluoroscopic spot views  FINDINGS: Antegrade LEFT tibial nail with proximal and distal interlocking screws.  IMPRESSION: ORIF of the tibia.   Electronically Signed   By: Andreas Newport M.D.   On: 07/06/2014 14:14    TELE NSR, rare PACs   ASSESSMENT AND PLAN No adverse periop CV events. Will sign off - please reconsult if needed   Thurmon Fair, MD, [facility] [phone] office [phone] pager 07/08/2014 11:09 AM

## 2014-07-08 NOTE — Progress Notes (Addendum)
[facility] PROGRESS NOTE  Lorraine Tran:[id number] DOB: [birth year] DOA: 07/04/2014 PCP: Darrow Bussing, MD  Brief Narrative: 80 y.o. year old female with significant past medical history of dementia, type 2 DM, CVA, afib on eliquus, presented after a fall. Per daughter, pt was found down in front of her house, mechanism of fall unclear. Was brought to the ER and found to have mildly displaced comminuted proximal tib-fib fx, L calcaneal intraarticular fracture. Subsequently also noted to have R distal tibia Fx. In addition, had nonspecific EKG changes and AS murmur and hence seen by [facility] for clearance. -Went to OR per Dr.Blackman 9/13, underwent Bilateral tibial intramedullary nail placement. -post op course with confusion at baseline and anemia s/p 2 units PRBC. SNF being planned. Tx to floor today  Assessment/Plan: 1-Fall/Encephalopathy  -Likely multifactorial with contributions including dementia, hyperglycemia, dehydration  -stopped IVF, MRI without acute findings  -UA /CXR unremarkable, all cultures negative -lactic acid improved  2- Fractures of L tib fib and calcaneus and R distal tibia Fx -ortho following, s/p Bilateral IM rod/nail 9/13 per Dr.Blackman -Per Ortho she will be non-weight bearing on her legs for the next 4- 6 weeks -eliquis resumed per Ortho -pain control  -incentive spirometry  3-Post op blood loss anemia and hemodilution -s/p 2units PRBC transfusion intra/peri op -CBC in am -Also Iron defi anemia based on panel  4. DM/Hyperglycemia  -glipizide and januvia on hold -now on  Lantus, SSI -A1C 6.7   4-Afib  -resumed metoprolol, eliquis resumed  5. Moderate AS  -noted on ECHO -cardiac enzymes negative -continue metoprolol  6-Leukocytosis  -likely reactive in setting of above, resolved and afebrile -FU blood cx negative, urine culture with 40K lactobacillus only  7. Dementia -not on meds  Prophylaxis: on apixaban  Code Status:  DNR Family Communication: d/w daughter at bedside 9/13 Disposition Plan: transfer to floor, likely SNF tomorrow   Consultants:  Cards  Ortho  HPI/Subjective: No complaints, pleasantly confused  Objective: Filed Vitals:   07/08/14 0404  BP: 158/133  Pulse: 86  Temp: 98.4 F (36.9 C)  Resp:     Intake/Output Summary (Last 24 hours) at 07/08/14 0835 Last data filed at 07/08/14 0404  Gross per 24 hour  Intake    960 ml  Output    100 ml  Net    860 ml   Filed Weights   07/04/14 2220 07/05/14 0400  Weight: 57.5 kg (126 lb 12.2 oz) 58.5 kg (128 lb 15.5 oz)    Exam:   General:  Alert, awake but disoriented, except to person  Cardiovascular: S1S2/RRR, systolic murmur noted  Respiratory: CTAB  Abdomen: soft, Nt, BS present  Musculoskeletal: R and L leg in brace from lower thigh down  Data Reviewed: Basic Metabolic Panel:  Recent Labs Lab 07/04/14 1722 07/05/14 0350 07/06/14 0500 07/06/14 1243 07/07/14 0620 07/08/14 0247  NA 139 145 139 140 141 137  K 4.2 4.4 4.3 4.2 3.9 3.4*  CL 106 113* 106  --  105 101  CO2  --  19 21  --  25 25  GLUCOSE 353* 207* 150* 227* 106* 160*  BUN 24* 27* 21  --  13 14  CREATININE 0.90 0.88 0.64  --  0.56 0.58  CALCIUM  --  8.1* 7.9*  --  7.8* 7.6*   Liver Function Tests:  Recent Labs Lab 07/04/14 1715 07/05/14 0350 07/06/14 0500 07/07/14 0620 07/08/14 0247  AST 29 26 39* 30 32  ALT 23  23  ALKPHOS 47 36* 42 44 46  BILITOT 0.8 0.4 0.4 1.1 0.9  PROT 6.0 5.0* 5.0* 5.0* 4.8*  ALBUMIN 3.5 2.7* 2.5* 2.4* 2.1*   No results found for this basename: LIPASE, AMYLASE,  in the last 168 hours No results found for this basename: AMMONIA,  in the last 168 hours CBC:  Recent Labs Lab 07/04/14 1715  07/05/14 0350 07/06/14 0500 07/06/14 1243 07/07/14 0620 07/08/14 0247  WBC 20.1*  --  13.5* 13.9*  --  12.4* 9.5  NEUTROABS 18.0*  --  10.9* 10.5*  --  9.6* 7.1  HGB 11.3*  < > 8.9* 8.5* 6.5* 11.2* 10.6*  HCT  33.7*  < > 26.3* 25.7* 19.0* 32.2* 30.2*  MCV 88.0  --  86.8 87.4  --  85.2 83.9  PLT 261  --  212 198  --  146* 161  < > = values in this interval not displayed. Cardiac Enzymes:  Recent Labs Lab 07/04/14 1715 07/04/14 2047 07/05/14 0350 07/05/14 0955  CKTOTAL 605* 640* 678*  --   TROPONINI  --  <0.30 <0.30 <0.30   BNP (last 3 results) No results found for this basename: PROBNP,  in the last 8760 hours CBG:  Recent Labs Lab 07/07/14 1202 07/07/14 1508 07/07/14 2040 07/08/14 0015 07/08/14 0405  GLUCAP 244* 231* 194* 182* 151*    Recent Results (from the past 240 hour(s))  CULTURE, BLOOD (ROUTINE X 2)     Status: None   Collection Time    07/04/14  8:54 PM      Result Value Ref Range Status   Specimen Description BLOOD ARM RIGHT   Final   Special Requests BOTTLES DRAWN AEROBIC AND ANAEROBIC 10CC   Final   Culture  Setup Time     Final   Value: 07/05/2014 04:25     Performed at [facility]   Culture     Final   Value:        BLOOD CULTURE RECEIVED NO GROWTH TO DATE CULTURE WILL BE HELD FOR 5 DAYS BEFORE ISSUING A FINAL NEGATIVE REPORT     Performed at [facility]   Report Status PENDING   Incomplete  CULTURE, BLOOD (ROUTINE X 2)     Status: None   Collection Time    07/04/14  9:01 PM      Result Value Ref Range Status   Specimen Description BLOOD FOREARM RIGHT   Final   Special Requests BOTTLES DRAWN AEROBIC AND ANAEROBIC B 10CC R 5CC   Final   Culture  Setup Time     Final   Value: 07/05/2014 04:24     Performed at [facility]   Culture     Final   Value:        BLOOD CULTURE RECEIVED NO GROWTH TO DATE CULTURE WILL BE HELD FOR 5 DAYS BEFORE ISSUING A FINAL NEGATIVE REPORT     Performed at [facility]   Report Status PENDING   Incomplete  MRSA PCR SCREENING     Status: None   Collection Time    07/04/14 10:36 PM      Result Value Ref Range Status   MRSA by PCR NEGATIVE  NEGATIVE Final   Comment:            The GeneXpert  MRSA Assay (FDA     approved for NASAL specimens     only), is one component of a     comprehensive MRSA  colonization     surveillance program. It is not     intended to diagnose MRSA     infection nor to guide or     monitor treatment for     MRSA infections.  URINE CULTURE     Status: None   Collection Time    07/05/14  6:36 AM      Result Value Ref Range Status   Specimen Description URINE, CATHETERIZED   Final   Special Requests NONE   Final   Culture  Setup Time     Final   Value: 07/05/2014 16:02     Performed at [facility] Count     Final   Value: 45,000 COLONIES/ML     Performed at [facility]   Culture     Final   Value: LACTOBACILLUS SPECIES     Note: Standardized susceptibility testing for this organism is not available.     Performed at [facility]   Report Status 07/07/2014 FINAL   Final     Studies: Dg Tibia/fibula Left  07/06/2014   CLINICAL DATA:  ORIF full or comminuted proximal tibia shaft fracture.  EXAM: LEFT TIBIA AND FIBULA - 2 VIEW  COMPARISON:  07/04/2014  FINDINGS: Intraoperative images demonstrate intramedullary nail placement in the tibia, with 2 proximal locking screws and 2 distal locking screws. Fracture fragments are in near anatomic alignment.  IMPRESSION: ORIF of comminuted left proximal tibia fracture.   Electronically Signed   By: Britta Mccreedy M.D.   On: 07/06/2014 14:14   Dg Tibia/fibula Right  07/06/2014   CLINICAL DATA:  Right tibial nail.  EXAM: RIGHT TIBIA AND FIBULA - 2 VIEW; DG C-ARM 61-120 MIN  COMPARISON:  No priors.  FINDINGS: 7 intraoperative fluoroscopic spot views of the right tibia document placement of an intramedullary nail traversing a spiral fracture of the distal right tibial diaphysis. Restoration of anatomic alignment is been achieved.  IMPRESSION: Intraoperative documentation of intramedullary nail fixation for right tibial fracture.   Electronically Signed   By: Trudie Reed M.D.   On:  07/06/2014 14:15   Dg C-arm 1-60 Min  07/06/2014   CLINICAL DATA:  Right tibial nail.  EXAM: RIGHT TIBIA AND FIBULA - 2 VIEW; DG C-ARM 61-120 MIN  COMPARISON:  No priors.  FINDINGS: 7 intraoperative fluoroscopic spot views of the right tibia document placement of an intramedullary nail traversing a spiral fracture of the distal right tibial diaphysis. Restoration of anatomic alignment is been achieved.  IMPRESSION: Intraoperative documentation of intramedullary nail fixation for right tibial fracture.   Electronically Signed   By: Trudie Reed M.D.   On: 07/06/2014 14:15   Dg C-arm 1-60 Min  07/06/2014   CLINICAL DATA:  Tibial fracture.  ORIF.  EXAM: DG C-ARM 61-120 MIN  TECHNIQUE: Series of intraoperative fluoroscopic spot views  FINDINGS: Antegrade LEFT tibial nail with proximal and distal interlocking screws.  IMPRESSION: ORIF of the tibia.   Electronically Signed   By: Andreas Newport M.D.   On: 07/06/2014 14:14    Scheduled Meds: . antiseptic oral rinse  7 mL Mouth Rinse q12n4p  . apixaban  2.5 mg Oral BID  . chlorhexidine  15 mL Mouth Rinse BID  . Influenza vac split quadrivalent PF  0.5 mL Intramuscular Tomorrow-1000  . insulin aspart  0-9 Units Subcutaneous 6 times per day  . insulin glargine  20 Units Subcutaneous QHS  . metoprolol tartrate  25 mg Oral BID  Continuous Infusions:   Antibiotics Given (last 72 hours)   Date/Time Action Medication Dose   07/06/14 1140 Given   clindamycin (CLEOCIN) 600 MG/50ML IVPB 600 mg      Principal Problem:   Leg fracture, left Active Problems:   HTN (hypertension)   Aortic stenosis   Dementia   Atrial fibrillation   Fall   Encephalopathy   Hyperglycemia    Time spent:    [facility]  [facility] Pager [phone]. If 7PM-7AM, please contact night-coverage at [email or web address], password [facility] 07/08/2014, 8:35 AM  LOS: 4 days

## 2014-07-09 DIAGNOSIS — S82209A Unspecified fracture of shaft of unspecified tibia, initial encounter for closed fracture: Secondary | ICD-10-CM | POA: Diagnosis not present

## 2014-07-09 LAB — CBC WITH DIFFERENTIAL/PLATELET
Basophils Absolute: 0 10*3/uL (ref 0.0–0.1)
Basophils Relative: 0 % (ref 0–1)
EOS ABS: 0.3 10*3/uL (ref 0.0–0.7)
Eosinophils Relative: 4 % (ref 0–5)
HCT: 29.6 % — ABNORMAL LOW (ref 36.0–46.0)
HEMOGLOBIN: 10.3 g/dL — AB (ref 12.0–15.0)
LYMPHS ABS: 1.1 10*3/uL (ref 0.7–4.0)
LYMPHS PCT: 13 % (ref 12–46)
MCH: 29.3 pg (ref 26.0–34.0)
MCHC: 34.8 g/dL (ref 30.0–36.0)
MCV: 84.3 fL (ref 78.0–100.0)
MONOS PCT: 14 % — AB (ref 3–12)
Monocytes Absolute: 1.2 10*3/uL — ABNORMAL HIGH (ref 0.1–1.0)
NEUTROS PCT: 69 % (ref 43–77)
Neutro Abs: 6.2 10*3/uL (ref 1.7–7.7)
Platelets: 186 10*3/uL (ref 150–400)
RBC: 3.51 MIL/uL — AB (ref 3.87–5.11)
RDW: 14.2 % (ref 11.5–15.5)
WBC: 8.8 10*3/uL (ref 4.0–10.5)

## 2014-07-09 LAB — GLUCOSE, CAPILLARY
Glucose-Capillary: 109 mg/dL — ABNORMAL HIGH (ref 70–99)
Glucose-Capillary: 183 mg/dL — ABNORMAL HIGH (ref 70–99)

## 2014-07-09 LAB — TYPE AND SCREEN
ABO/RH(D): A POS
ANTIBODY SCREEN: NEGATIVE
UNIT DIVISION: 0
UNIT DIVISION: 0
Unit division: 0

## 2014-07-09 LAB — COMPREHENSIVE METABOLIC PANEL
ALT: 20 U/L (ref 0–35)
AST: 23 U/L (ref 0–37)
Albumin: 1.9 g/dL — ABNORMAL LOW (ref 3.5–5.2)
Alkaline Phosphatase: 42 U/L (ref 39–117)
Anion gap: 12 (ref 5–15)
BUN: 12 mg/dL (ref 6–23)
CALCIUM: 7.6 mg/dL — AB (ref 8.4–10.5)
CO2: 27 mEq/L (ref 19–32)
CREATININE: 0.5 mg/dL (ref 0.50–1.10)
Chloride: 102 mEq/L (ref 96–112)
GFR calc non Af Amer: 83 mL/min — ABNORMAL LOW (ref >90)
GLUCOSE: 104 mg/dL — AB (ref 70–99)
Potassium: 3.3 mEq/L — ABNORMAL LOW (ref 3.7–5.3)
SODIUM: 141 meq/L (ref 137–147)
TOTAL PROTEIN: 4.5 g/dL — AB (ref 6.0–8.3)
Total Bilirubin: 0.9 mg/dL (ref 0.3–1.2)

## 2014-07-09 MED ORDER — HYDROCODONE-ACETAMINOPHEN 5-325 MG PO TABS: 1.0000 | ORAL_TABLET | Freq: Four times a day (QID) | ORAL | Status: DC | PRN

## 2014-07-09 MED ORDER — POTASSIUM CHLORIDE CRYS ER 20 MEQ PO TBCR: 40.0000 meq | EXTENDED_RELEASE_TABLET | Freq: Once | ORAL | Status: DC

## 2014-07-09 NOTE — Progress Notes (Signed)
D/c via ambulance to [facility]. Report called to [place] RN at facilty.  All tubes and drains removed and breathing remained regular and unlabored on room air.

## 2014-07-09 NOTE — Discharge Instructions (Signed)
Can remove the dressings 07/11/14 on both of her legs; then new dry dressings daily on all of her incisions. Will need to remain basically non-weight bearing until further notice.

## 2014-07-09 NOTE — Progress Notes (Signed)
Patient ID: Lorraine Tran, female   DOB: 02/08/1922, 80 y.o.   MRN: [id number] Dressings intact bilateral legs.  Feet with good perfusion.  Appears comfortable.  Can discharge to SNF from Ortho standpoint.  Will leave discharge instructions.  Will need to remain NWB on her legs with only pivot to wheelchair until further notice.

## 2014-07-09 NOTE — Clinical Social Work Note (Signed)
Patient will discharge to [facility] SNF Report #[phone] Anticipated discharge date: 07/09/14 Family notified: [employer] by [employer]- scheduled for 1:30  CSW signing off.  Merlyn Lot, LCSWA Clinical Social Worker [phone]

## 2014-07-09 NOTE — Discharge Summary (Addendum)
Physician Discharge Summary   ELLENORA TALTON WUJ:[id number] DOB: 28-Aug-1922 DOA: 07/04/2014  PCP: Darrow Bussing, MD  Admit date: 07/04/2014  Discharge date: 07/09/2014   Time spent: 45 minutes  Recommendations for Outpatient Follow-up:  1. Dr.Blackman in 1-2weeks 2. Per Ortho she should be non-weight bearing on her legs for the next 4- 6 weeks. She can put weight on her right leg to pivot into a wheelchair. 3.  Discharge Diagnoses:  Principal Problem:  Left leg Tib fib fracture  Hypokalemia  R distal tibia fracture  HTN (hypertension)  Aortic stenosis  Dementia  Atrial fibrillation  Fall  Encephalopathy  Hyperglycemia  Leukocytosis   Discharge Condition: stable  Diet recommendation: heart healthy   Cbc weekly BMP weekly  Filed Weights    07/04/14 2220  07/05/14 0400   Weight:  57.5 kg (126 lb 12.2 oz)  58.5 kg (128 lb 15.5 oz)    History of present illness:  80 y.o. year old female with significant past medical history of dementia, type 2 DM, CVA, afib on eliquus, presented after a fall. Per daughter, pt was found down in front of her house, mechanism of fall unclear.  Was brought to the ER and found to have mildly displaced comminuted proximal tib-fib fx, L calcaneal intraarticular fracture.  Subsequently also noted to have R distal tibia Fx.  Hospital Course:  1-Fall/Encephalopathy  -Likely multifactorial with contributions including dementia, hyperglycemia, dehydration  -stopped IVF, MRI without acute findings  -UA /CXR unremarkable, all cultures negative  -lactic acid improved  -clinically stable at baseline now   2- Fractures of L tib fib and calcaneus and R distal tibia Fx  -ortho following, s/p Bilateral IM rod/nail 9/13 per Dr.Blackman  -Per Ortho she will be non-weight bearing on her legs for the next 4- 6 weeks  -eliquis resumed per Ortho  -pain controled   3-Post op blood loss anemia and hemodilution  -s/p 2units PRBC transfusion intra/peri op  -hb  stable since  -Also Iron defi anemia based on panel  Monitor CBC weekly  4. DM/Hyperglycemia  -glipizide and januvia on hold  -now on Lantus, SSI  -A1C 6.7   4-Afib  -resumed metoprolol, eliquis resumed  -stable   5. Moderate AS  -noted on ECHO  -cardiac enzymes negative  -continue metoprolol    6.Leukocytosis  -likely reactive in setting of above, resolved and afebrile  -FU blood cx negative, urine culture with 40K lactobacillus only   7. Dementia  -not on meds    Procedures:  PROCEDURES: Bilateral and tibial intramedullary nail placement  Consultations:  Cards  Ortho Discharge Exam:  Filed Vitals:    07/08/14 1600   BP:  164/47   Pulse:    Temp:  98.1 F (36.7 C)   Resp:     General: AAO to self only, pleasantly confused  Cardiovascular: S1S2/RRR, systolic murmur  Respiratory: CTAB    Discharge Instructions  You were cared for by a hospitalist during your hospital stay. If you have any questions about your discharge medications or the care you received while you were in the hospital after you are discharged, you can call the unit and asked to speak with the hospitalist on call if the hospitalist that took care of you is not available. Once you are discharged, your primary care physician will handle any further medical issues. Please note that NO REFILLS for any discharge medications will be authorized once you are discharged, as it is imperative that you return to your primary  care physician (or establish a relationship with a primary care physician if you do not have one) for your aftercare needs so that they can reassess your need for medications and monitor your lab values.   Current Discharge Medication List     CONTINUE these medications which have NOT CHANGED    Details   apixaban (ELIQUIS) 2.5 MG TABS tablet  Take 2.5 mg by mouth 2 (two) times daily.     insulin glargine (LANTUS) 100 UNIT/ML injection  Inject 24 Units into the skin daily before supper.      metoprolol tartrate (LOPRESSOR) 25 MG tablet  Take 25 mg by mouth 2 (two) times daily.     sitaGLIPtin (JANUVIA) 100 MG tablet  Take 100 mg by mouth daily.       STOP taking these medications      glipiZIDE (GLUCOTROL) 10 MG tablet         Allergies   Allergen  Reactions   .  Penicillins  Other (See Comments)     Unknown reaction      The results of significant diagnostics from this hospitalization (including imaging, microbiology, ancillary and laboratory) are listed below for reference.   Significant Diagnostic Studies:  Dg Chest 1 View  07/04/2014 CLINICAL DATA: Found down. Altered mental status. EXAM: CHEST - 1 VIEW COMPARISON: Chest x-ray 04/22/2012. FINDINGS: Lung volumes are low. No consolidative airspace disease. No pleural effusions. No pneumothorax. No pulmonary nodule or mass noted. Pulmonary vasculature and the cardiomediastinal silhouette are within normal limits. Atherosclerosis in the thoracic aorta. Mild bilateral apical pleural parenchymal thickening, presumably post infectious or inflammatory scarring (unchanged). IMPRESSION: 1. Low lung volumes without radiographic evidence of acute cardiopulmonary disease. 2. Atherosclerosis. Electronically Signed By: Trudie Reed M.D. On: 07/04/2014 18:58  Dg Thoracic Spine 2 View  07/04/2014 CLINICAL DATA: Patient fell. Altered mental status. EXAM: THORACIC SPINE - 2 VIEW COMPARISON: Chest x-rays dated 07/04/2014 and 04/22/2012 FINDINGS: There is a very slight anterior wedge deformity of the superior endplate of T4, age indeterminate. There is diffuse osteopenia. T1 is not well visualized on the available images. Heart size is normal. Suggestion of a moderate hiatal hernia. IMPRESSION: Slight compression deformity of the superior endplate of T4, age indeterminate. T1 is not well visualized on the lateral view. Electronically Signed By: Geanie Cooley M.D. On: 07/04/2014 19:02  Dg Lumbar Spine Complete  07/04/2014 CLINICAL DATA: Found  down. Altered mental status. EXAM: LUMBAR SPINE - COMPLETE 4+ VIEW COMPARISON: Lumbar spine MRI 04/23/2012. FINDINGS: Old compression fracture of L1 with approximately 70% loss of anterior vertebral body height. No new acute displaced fractures or compression type fractures are otherwise noted. Multilevel degenerative disc disease, most severe at L5-S1. Severe multilevel facet arthropathy, most severe at L4-L5 and L5-S1. There is a 5 mm of anterolisthesis of L4 upon L5. 8 mm of anterolisthesis of L5 upon S1. Mild exaggeration of normal lumbar lordosis and mild levoscoliosis in the mid lumbar spine, but alignment is otherwise anatomic, although there is a mild acute kyphosis at the level of T12-L1, related to the chronic compression fracture. Extensive atherosclerosis. IMPRESSION: 1. No acute findings. 2. Extensive multilevel degenerative disc disease, lumbar spondylosis, mild lumbar levoscoliosis, and old compression fracture at L2 are similar to prior examinations, as above. Electronically Signed By: Trudie Reed M.D. On: 07/04/2014 19:04  Dg Pelvis 1-2 Views  07/04/2014 CLINICAL DATA: Patient found down. EXAM: PELVIS - 1-2 VIEW COMPARISON: Leg radiographs today. FINDINGS: Diffuse osteopenia. No displaced pelvic fracture. Atherosclerosis. Both hips  are externally rotated. No displaced hip fracture is identified. Lumbar spondylosis. Moderate to large amount of stool in the rectosigmoid. IMPRESSION: No acute osseous abnormality. Electronically Signed By: Andreas Newport M.D. On: 07/04/2014 19:01  Dg Tibia/fibula Left  07/06/2014 CLINICAL DATA: ORIF full or comminuted proximal tibia shaft fracture. EXAM: LEFT TIBIA AND FIBULA - 2 VIEW COMPARISON: 07/04/2014 FINDINGS: Intraoperative images demonstrate intramedullary nail placement in the tibia, with 2 proximal locking screws and 2 distal locking screws. Fracture fragments are in near anatomic alignment. IMPRESSION: ORIF of comminuted left proximal tibia fracture.  Electronically Signed By: Britta Mccreedy M.D. On: 07/06/2014 14:14  Dg Tibia/fibula Left  07/04/2014 CLINICAL DATA: Altered mental status. Leg pain. Found down. EXAM: LEFT TIBIA AND FIBULA - 2 VIEW COMPARISON: None. FINDINGS: Comminuted proximal tibial diaphysis fracture is present. 7 mm medial displacement of the medial cortical shard. Nonstandard projections are submitted. There also is a proximal to mid fibular shaft fracture with one shaft width medial displacement. Diffuse osteopenia. Atherosclerosis. IMPRESSION: Study degraded by nonstandard projections. Mildly displaced comminuted proximal tibial shaft fracture. Proximal to mid fibular shaft fracture with 1 shaft width medial displacement. Electronically Signed By: Andreas Newport M.D. On: 07/04/2014 18:58  Dg Tibia/fibula Right  07/06/2014 CLINICAL DATA: Right tibial nail. EXAM: RIGHT TIBIA AND FIBULA - 2 VIEW; DG C-ARM 61-120 MIN COMPARISON: No priors. FINDINGS: 7 intraoperative fluoroscopic spot views of the right tibia document placement of an intramedullary nail traversing a spiral fracture of the distal right tibial diaphysis. Restoration of anatomic alignment is been achieved. IMPRESSION: Intraoperative documentation of intramedullary nail fixation for right tibial fracture. Electronically Signed By: Trudie Reed M.D. On: 07/06/2014 14:15  Dg Ankle Complete Left  07/04/2014 CLINICAL DATA: Left ankle swelling. EXAM: LEFT ANKLE COMPLETE - 3+ VIEW COMPARISON: None. FINDINGS: An oblique calcaneal fracture is identified extending from the subtalar joint to the inferior surface. No other fracture, subluxation or dislocation identified. Mild degenerative changes at the tibiotalar joint noted. Heavy vascular calcifications are identified. IMPRESSION: Intraarticular calcaneal fracture extending from the subtalar joint to the inferior surface. Electronically Signed By: Laveda Abbe M.D. On: 07/04/2014 19:02  Ct Head Wo Contrast  07/04/2014 CLINICAL DATA: Fall.  Found down. Confused. EXAM: CT HEAD WITHOUT CONTRAST CT CERVICAL SPINE WITHOUT CONTRAST TECHNIQUE: Multidetector CT imaging of the head and cervical spine was performed following the standard protocol without intravenous contrast. Multiplanar CT image reconstructions of the cervical spine were also generated. COMPARISON: CT head and cervical spine 04/22/2012 FINDINGS: CT HEAD FINDINGS Stable extensive encephalomalacia in the parietal lobes bilaterally and in the left occipital lobe, suggestive of prior infarctions. Stable chronic hypodensity in the periventricular white matter bilaterally. Remote basal ganglia infarcts bilaterally are unchanged. Diffuse cerebral volume loss noted. Ventricles are stable in size. Negative for intra or extra-axial hemorrhage, evidence of hydrocephalus, or CT findings of acute cortically based infarction. Extensive atherosclerotic calcification in the visualized vertebral arteries and the visualized intracranial internal carotid arteries. Skull is intact. Visualized paranasal sinuses and mastoid air cells are clear. CT CERVICAL SPINE FINDINGS Cervical spine is normally aligned from the skullbase through the cervicothoracic junction. Moderate disc height narrowing at C4-5, C5-6, and C6-7 is without significant change. Posterior osseous spurring and anterior osteophyte formation is seen at each of these levels. There is also mild posterior osseous spurring at C3-C4. Facet joint degenerative changes are noted bilaterally at C3-C4 and C4-C5. Incomplete fusion of the posterior arch of C1 is again noted. Moderate osseous neural foraminal narrowing on the right at C3-C4. Mild  bilateral neural foraminal narrowing at C4-C5. Mild bilateral neural foraminal narrowing at C5-C6. Mild bilateral neural foraminal narrowing at C6-C7. IMPRESSION: 1. No acute intracranial abnormality identified. Extensive chronic remote cerebral infarcts and chronic white matter ischemic changes. 2. Chronic cervical  spondylosis. 3. No evidence of acute bony injury to the cervical spine. Electronically Signed By: Britta Mccreedy M.D. On: 07/04/2014 18:24  Ct Cervical Spine Wo Contrast  07/04/2014 CLINICAL DATA: Patient found down. Confusion. EXAM: CT CERVICAL SPINE WITHOUT CONTRAST TECHNIQUE: Multidetector CT imaging of the cervical spine was performed without intravenous contrast. Multiplanar CT image reconstructions were also generated. COMPARISON: Cervical spine CT scan 04/22/2012. FINDINGS: No acute displaced fractures in the cervical spine. Severe multilevel degenerative disc disease again noted, most pronounced at C6-C7. Moderate multilevel facet arthropathy. Alignment is anatomic. Prevertebral soft tissues are normal. Incidental note is again made of incomplete fusion of the posterior arch of C1 (normal variant). Incidental imaging of the upper thorax is unremarkable. IMPRESSION: 1. No acute abnormality of the cervical spine. 2. Severe multilevel degenerative disc disease and cervical spondylosis again noted, as above. Electronically Signed By: Trudie Reed M.D. On: 07/04/2014 18:18  Mr Brain Wo Contrast  07/04/2014 CLINICAL DATA: Fall, encephalopathy. EXAM: MRI HEAD WITHOUT CONTRAST TECHNIQUE: Multiplanar, multiecho pulse sequences of the brain and surrounding structures were obtained without intravenous contrast. COMPARISON: Prior CT from earlier the same day as well as prior MRI from 04/23/2012. FINDINGS: Study is degraded by motion artifact. Diffuse prominence of the CSF containing spaces is compatible with advanced generalized cerebral atrophy. Encephalomalacia within the left occipital lobe is compatible with remote left PCA territory infarct, stable from prior. Remote right parietal-occipital infarct is unchanged. Additional remote lacunar infarcts involving the left basal ganglia also unchanged. Small remote lacunar infarct within the posterior right centrum semi ovale. Advanced chronic microvascular ischemic  changes again noted, stable from prior. No diffusion-weighted signal abnormality to suggest acute intracranial infarct. Gray-white matter differentiation maintained. No acute intracranial hemorrhage. No mass lesion or midline shift. Ventricular prominence related to global atrophy present. No hydrocephalus. No extra-axial fluid collection. Craniocervical junction widely patent. Degenerative spondylolysis noted within the upper cervical spine. Pituitary gland grossly unremarkable. No acute abnormality seen about either orbit. Calvarium and scalp soft tissues within normal limits. Paranasal sinuses clear. No mastoid effusion. IMPRESSION: 1. No acute intracranial infarct or other abnormality identified. 2. Global atrophy with chronic small vessel ischemic disease and remote infarcts as above. Electronically Signed By: Rise Mu M.D. On: 07/04/2014 23:22  Dg C-arm 1-60 Min  07/06/2014 CLINICAL DATA: Right tibial nail. EXAM: RIGHT TIBIA AND FIBULA - 2 VIEW; DG C-ARM 61-120 MIN COMPARISON: No priors. FINDINGS: 7 intraoperative fluoroscopic spot views of the right tibia document placement of an intramedullary nail traversing a spiral fracture of the distal right tibial diaphysis. Restoration of anatomic alignment is been achieved. IMPRESSION: Intraoperative documentation of intramedullary nail fixation for right tibial fracture. Electronically Signed By: Trudie Reed M.D. On: 07/06/2014 14:15  Dg C-arm 1-60 Min  07/06/2014 CLINICAL DATA: Tibial fracture. ORIF. EXAM: DG C-ARM 61-120 MIN TECHNIQUE: Series of intraoperative fluoroscopic spot views FINDINGS: Antegrade LEFT tibial nail with proximal and distal interlocking screws. IMPRESSION: ORIF of the tibia. Electronically Signed By: Andreas Newport M.D. On: 07/06/2014 14:14   Microbiology:  Recent Results (from the past 240 hour(s))   CULTURE, BLOOD (ROUTINE X 2) Status: None    Collection Time    07/04/14 8:54 PM   Result  Value  Ref Range  Status  Specimen Description  BLOOD ARM RIGHT   Final    Special Requests  BOTTLES DRAWN AEROBIC AND ANAEROBIC 10CC   Final    Culture Setup Time    Final    Value:  07/05/2014 04:25     Performed at [facility]    Culture    Final    Value:  BLOOD CULTURE RECEIVED NO GROWTH TO DATE CULTURE WILL BE HELD FOR 5 DAYS BEFORE ISSUING A FINAL NEGATIVE REPORT     Performed at [facility]    Report Status  PENDING   Incomplete   CULTURE, BLOOD (ROUTINE X 2) Status: None    Collection Time    07/04/14 9:01 PM   Result  Value  Ref Range  Status    Specimen Description  BLOOD FOREARM RIGHT   Final    Special Requests  BOTTLES DRAWN AEROBIC AND ANAEROBIC B 10CC R 5CC   Final    Culture Setup Time    Final    Value:  07/05/2014 04:24     Performed at [facility]    Culture    Final    Value:  BLOOD CULTURE RECEIVED NO GROWTH TO DATE CULTURE WILL BE HELD FOR 5 DAYS BEFORE ISSUING A FINAL NEGATIVE REPORT     Performed at [facility]    Report Status  PENDING   Incomplete   MRSA PCR SCREENING Status: None    Collection Time    07/04/14 10:36 PM   Result  Value  Ref Range  Status    MRSA by PCR  NEGATIVE  NEGATIVE  Final    Comment:      The GeneXpert MRSA Assay (FDA     approved for NASAL specimens     only), is one component of a     comprehensive MRSA colonization     surveillance program. It is not     intended to diagnose MRSA     infection nor to guide or     monitor treatment for     MRSA infections.   URINE CULTURE Status: None    Collection Time    07/05/14 6:36 AM   Result  Value  Ref Range  Status    Specimen Description  URINE, CATHETERIZED   Final    Special Requests  NONE   Final    Culture Setup Time    Final    Value:  07/05/2014 16:02     Performed at [facility]    Final    Value:  45,000 COLONIES/ML     Performed at [facility]    Culture    Final    Value:  LACTOBACILLUS SPECIES     Note: Standardized  susceptibility testing for this organism is not available.     Performed at [facility]    Report Status  07/07/2014 FINAL   Final    Labs:  Basic Metabolic Panel:   Recent Labs  Lab  07/04/14 1722  07/05/14 0350  07/06/14 0500  07/06/14 1243  07/07/14 0620  07/08/14 0247   NA  139  145  139  140  141  137   K  4.2  4.4  4.3  4.2  3.9  3.4*   CL  106  113*  106  --  105  101   CO2  --  19  21  --  25  25  GLUCOSE  353*  207*  150*  227*  106*  160*   BUN  24*  27*  21  --  13  14   CREATININE  0.90  0.88  0.64  --  0.56  0.58   CALCIUM  --  8.1*  7.9*  --  7.8*  7.6*    Liver Function Tests:   Recent Labs  Lab  07/04/14 1715  07/05/14 0350  07/06/14 0500  07/07/14 0620  07/08/14 0247   AST  29  26  39*  30  32   ALT  ALKPHOS  47  36*  42  44  46   BILITOT  0.8  0.4  0.4  1.1  0.9   PROT  6.0  5.0*  5.0*  5.0*  4.8*   ALBUMIN  3.5  2.7*  2.5*  2.4*  2.1*    No results found for this basename: LIPASE, AMYLASE, in the last 168 hours  No results found for this basename: AMMONIA, in the last 168 hours  CBC:   Recent Labs  Lab  07/04/14 1715   07/05/14 0350  07/06/14 0500  07/06/14 1243  07/07/14 0620  07/08/14 0247   WBC  20.1*  --  13.5*  13.9*  --  12.4*  9.5   NEUTROABS  18.0*  --  10.9*  10.5*  --  9.6*  7.1   HGB  11.3*  < >  8.9*  8.5*  6.5*  11.2*  10.6*   HCT  33.7*  < >  26.3*  25.7*  19.0*  32.2*  30.2*   MCV  88.0  --  86.8  87.4  --  85.2  83.9   PLT  261  --  212  198  --  146*  161   < > = values in this interval not displayed.  Cardiac Enzymes:   Recent Labs  Lab  07/04/14 1715  07/04/14 2047  07/05/14 0350  07/05/14 0955   CKTOTAL  605*  640*  678*  --   TROPONINI  --  <0.30  <0.30  <0.30    BNP:  BNP (last 3 results)  No results found for this basename: PROBNP, in the last 8760 hours  CBG:   Recent Labs  Lab  07/08/14 0015  07/08/14 0405  07/08/14 0827  07/08/14 1056  07/08/14 1632   GLUCAP   182*  151*  128*  240*  190*

## 2014-07-10 ENCOUNTER — Encounter: Payer: Self-pay | Admitting: Internal Medicine

## 2014-07-10 ENCOUNTER — Non-Acute Institutional Stay (SKILLED_NURSING_FACILITY): Payer: Medicare Other | Admitting: Internal Medicine

## 2014-07-10 DIAGNOSIS — I359 Nonrheumatic aortic valve disorder, unspecified: Secondary | ICD-10-CM

## 2014-07-10 DIAGNOSIS — S82202D Unspecified fracture of shaft of left tibia, subsequent encounter for closed fracture with routine healing: Secondary | ICD-10-CM

## 2014-07-10 DIAGNOSIS — S82301D Unspecified fracture of lower end of right tibia, subsequent encounter for closed fracture with routine healing: Secondary | ICD-10-CM

## 2014-07-10 DIAGNOSIS — D62 Acute posthemorrhagic anemia: Secondary | ICD-10-CM

## 2014-07-10 DIAGNOSIS — S92009A Unspecified fracture of unspecified calcaneus, initial encounter for closed fracture: Secondary | ICD-10-CM | POA: Insufficient documentation

## 2014-07-10 DIAGNOSIS — S82402D Unspecified fracture of shaft of left fibula, subsequent encounter for closed fracture with routine healing: Secondary | ICD-10-CM

## 2014-07-10 DIAGNOSIS — IMO0001 Reserved for inherently not codable concepts without codable children: Secondary | ICD-10-CM

## 2014-07-10 DIAGNOSIS — S8290XD Unspecified fracture of unspecified lower leg, subsequent encounter for closed fracture with routine healing: Secondary | ICD-10-CM

## 2014-07-10 DIAGNOSIS — I350 Nonrheumatic aortic (valve) stenosis: Secondary | ICD-10-CM

## 2014-07-10 DIAGNOSIS — I10 Essential (primary) hypertension: Secondary | ICD-10-CM

## 2014-07-10 DIAGNOSIS — G9340 Encephalopathy, unspecified: Secondary | ICD-10-CM

## 2014-07-10 DIAGNOSIS — F0390 Unspecified dementia without behavioral disturbance: Secondary | ICD-10-CM

## 2014-07-10 DIAGNOSIS — S92002D Unspecified fracture of left calcaneus, subsequent encounter for fracture with routine healing: Secondary | ICD-10-CM

## 2014-07-10 DIAGNOSIS — I482 Chronic atrial fibrillation, unspecified: Secondary | ICD-10-CM

## 2014-07-10 DIAGNOSIS — I4891 Unspecified atrial fibrillation: Secondary | ICD-10-CM

## 2014-07-10 DIAGNOSIS — S82301A Unspecified fracture of lower end of right tibia, initial encounter for closed fracture: Secondary | ICD-10-CM | POA: Insufficient documentation

## 2014-07-10 DIAGNOSIS — E119 Type 2 diabetes mellitus without complications: Secondary | ICD-10-CM

## 2014-07-10 NOTE — Assessment & Plan Note (Signed)
S/p 2 units- 6.6 to 10.3; also iron def based on labs -will start iron 325 daily

## 2014-07-10 NOTE — Assessment & Plan Note (Signed)
Likely multifactorial with contributions including dementia, hyperglycemia, dehydration  -stopped IVF, MRI without acute findings  -UA /CXR unremarkable, all cultures negative  -lactic acid improved  -clinically stable at baseline now

## 2014-07-10 NOTE — Assessment & Plan Note (Signed)
IM nail;same as above-nonweightbearing 4-6 weeks with eliquis;service forgot to include pain medication in med list;will write for norco 5/325-1-2 q6 per

## 2014-07-10 NOTE — Assessment & Plan Note (Signed)
A1c 6.7 on home regimen;to continue

## 2014-07-10 NOTE — Assessment & Plan Note (Signed)
On no meds;seems moderate

## 2014-07-10 NOTE — Assessment & Plan Note (Signed)
Controlled on metoprolol.

## 2014-07-10 NOTE — Assessment & Plan Note (Signed)
resumed metoprolol, eliquis resumed  -stable

## 2014-07-10 NOTE — Assessment & Plan Note (Signed)
non weightbearing 4-6 weeks; may pivot on R leg to get into WC; eliquis as prophylaxis

## 2014-07-10 NOTE — Progress Notes (Signed)
MRN: [id number] Name: Lorraine Tran  Sex: female Age: 80 y.o. DOB: 01-10-1922  [facility] #: [facility] Facility/Room: 107A Level Of Care: SNF Provider: Merrilee Seashore D Emergency Contacts: Extended Emergency Contact Information Primary Emergency Contact: Cira Servant [place] Home Phone: [phone] Work Phone: [phone] Relation: Daughter     Allergies: Penicillins  Chief Complaint  Patient presents with  . New Admit To SNF    HPI: Patient is 80 y.o. female who is admitted after a fall and breaking both legs.  Past Medical History  Diagnosis Date  . Diabetes mellitus   . Dementia   . Stroke   . Atrial fibrillation 06/06/2012    Eliquis started 8/13  . Aortic stenosis     a. Echo 6/13:  mild LVH, EF 65-70%, LV vigorous, mod AS, mean 26 mmHg, MAC, mild MS, mean 4 mmHg, mild LAE, PASP 48    Past Surgical History  Procedure Laterality Date  . Abdominal hysterectomy    . Cholecystectomy    . Tibia im nail insertion Left 07/06/2014    Procedure: INTRAMEDULLARY (IM) NAIL TIBIAL;  Surgeon: Kathryne Hitch, MD;  Location: [facility] OR;  Service: Orthopedics;  Laterality: Left;  . Tibia im nail insertion Right 07/06/2014    Procedure: INTRAMEDULLARY (IM) NAIL TIBIAL;  Surgeon: Kathryne Hitch, MD;  Location: [facility] OR;  Service: Orthopedics;  Laterality: Right;      Medication List       This list is accurate as of: 07/10/14  2:59 PM.  Always use your most recent med list.               ELIQUIS 2.5 MG Tabs tablet  Generic drug:  apixaban  Take 2.5 mg by mouth 2 (two) times daily.     HYDROcodone-acetaminophen 5-325 MG per tablet  Commonly known as:  NORCO  Take 1 tablet by mouth every 6 (six) hours as needed for moderate pain.     insulin glargine 100 UNIT/ML injection  Commonly known as:  LANTUS  Inject 24 Units into the skin daily before supper.     metoprolol tartrate 25 MG tablet  Commonly known as:  LOPRESSOR  Take 25 mg by  mouth 2 (two) times daily.     sitaGLIPtin 100 MG tablet  Commonly known as:  JANUVIA  Take 100 mg by mouth daily.        No orders of the defined types were placed in this encounter.    Immunization History  Administered Date(s) Administered  . Influenza,inj,Quad PF,36+ Mos 07/09/2014  . Pneumococcal Polysaccharide-23 04/24/2012    History  Substance Use Topics  . Smoking status: Never Smoker   . Smokeless tobacco: Never Used  . Alcohol Use: No    Family history is noncontributory    Review of Systems    UTO from pt;nurses voice that she needs pain medication, not on med list from hospital    Filed Vitals:   07/10/14 1425  BP: 122/66  Pulse: 62  Temp: 97.1 F (36.2 C)  Resp: 18    Physical Exam  GENERAL APPEARANCE: Alert, minconversant. Appropriately groomed. No acute distress.  SKIN: No diaphoresis rash HEAD: Normocephalic, atraumatic  EYES: Conjunctiva/lids clear. Pupils round, reactive. EOMs intact.  EARS: External exam WNL, canals clear. Hearing grossly normal.  NOSE: No deformity or discharge.  MOUTH/THROAT: Lips w/o lesions.  RESPIRATORY: Breathing is even, unlabored. Lung sounds are clear   CARDIOVASCULAR: Heart irreg 3/6 systlic murmurs,no  rubs or gallops. No  peripheral edema.  GASTROINTESTINAL: Abdomen is soft, non-tender, not distended w/ normal bowel sounds GENITOURINARY: Bladder non tender, not distended  MUSCULOSKELETAL: post op wraps, both legs NEUROLOGIC:  Cranial nerves 2-12 grossly intact. Moves all extremities PSYCHIATRIC: Mod dementia, no behavioral issues  Patient Active Problem List   Diagnosis Date Noted  . Traumatic closed nondisplaced fracture of distal end of right tibia 07/10/2014  . Heel fracture 07/10/2014  . Acute blood loss anemia 07/10/2014  . Fall 07/04/2014  . Fracture of left tibia and fibula 07/04/2014  . Encephalopathy 07/04/2014  . Hyperglycemia 07/04/2014  . Atrial fibrillation 06/06/2012  . Symptomatic  bradycardia 04/23/2012  . Elevated cholesterol 04/23/2012  . Elevated LDL cholesterol level 04/23/2012  . Aortic stenosis 04/23/2012  . Dementia 04/23/2012  . Leukocytosis 04/23/2012  . Dehydration 04/23/2012  . Lumbar compression fracture 04/23/2012  . Syncope 04/22/2012  . HTN (hypertension) 04/22/2012  . Diabetes type 2, controlled 04/22/2012    CBC    Component Value Date/Time   WBC 8.8 07/09/2014 0350   RBC 3.51* 07/09/2014 0350   RBC 2.85* 07/05/2014 0955   HGB 10.3* 07/09/2014 0350   HCT 29.6* 07/09/2014 0350   PLT 186 07/09/2014 0350   MCV 84.3 07/09/2014 0350   LYMPHSABS 1.1 07/09/2014 0350   MONOABS 1.2* 07/09/2014 0350   EOSABS 0.3 07/09/2014 0350   BASOSABS 0.0 07/09/2014 0350    CMP     Component Value Date/Time   NA 141 07/09/2014 0350   K 3.3* 07/09/2014 0350   CL 102 07/09/2014 0350   CO2 27 07/09/2014 0350   GLUCOSE 104* 07/09/2014 0350   BUN 12 07/09/2014 0350   CREATININE 0.50 07/09/2014 0350   CALCIUM 7.6* 07/09/2014 0350   PROT 4.5* 07/09/2014 0350   ALBUMIN 1.9* 07/09/2014 0350   AST 23 07/09/2014 0350   ALT 20 07/09/2014 0350   ALKPHOS 42 07/09/2014 0350   BILITOT 0.9 07/09/2014 0350   GFRNONAA 83* 07/09/2014 0350   GFRAA >90 07/09/2014 0350    Assessment and Plan  Encephalopathy Likely multifactorial with contributions including dementia, hyperglycemia, dehydration  -stopped IVF, MRI without acute findings  -UA /CXR unremarkable, all cultures negative  -lactic acid improved  -clinically stable at baseline now    Traumatic closed nondisplaced fracture of distal end of right tibia non weightbearing 4-6 weeks; may pivot on R leg to get into WC; eliquis as prophylaxis  Fracture of left tibia and fibula IM nail;same as above-nonweightbearing 4-6 weeks with eliquis;service forgot to include pain medication in med list;will write for norco 5/325-1-2 q6 per  Heel fracture aS ABOVE FOR B LEG FRACTURES  Acute blood loss anemia S/p 2 units- 6.6 to 10.3; also  iron def based on labs -will start iron 325 daily  Atrial fibrillation resumed metoprolol, eliquis resumed  -stable    Aortic stenosis -noted on ECHO  -cardiac enzymes negative  -continue metoprolol    HTN (hypertension) Controlled on metoprolol  Diabetes type 2, controlled A1c 6.7 on home regimen;to continue  Dementia On no meds;seems moderate    Margit Hanks, MD

## 2014-07-10 NOTE — Assessment & Plan Note (Signed)
aS ABOVE FOR B LEG FRACTURES

## 2014-07-10 NOTE — Assessment & Plan Note (Signed)
-  noted on ECHO  -cardiac enzymes negative  -continue metoprolol

## 2014-07-11 LAB — CULTURE, BLOOD (ROUTINE X 2)
CULTURE: NO GROWTH
Culture: NO GROWTH

## 2014-08-11 ENCOUNTER — Emergency Department (HOSPITAL_COMMUNITY): Payer: Medicare Other

## 2014-08-11 ENCOUNTER — Inpatient Hospital Stay (HOSPITAL_COMMUNITY)
Admission: EM | Admit: 2014-08-11 | Discharge: 2014-08-22 | DRG: 871 | Disposition: A | Discharge status: Hospice | Payer: Medicare Other | Attending: Internal Medicine | Admitting: Internal Medicine

## 2014-08-11 ENCOUNTER — Encounter (HOSPITAL_COMMUNITY): Payer: Self-pay | Admitting: Emergency Medicine

## 2014-08-11 ENCOUNTER — Non-Acute Institutional Stay (SKILLED_NURSING_FACILITY): Payer: Medicare Other | Admitting: Internal Medicine

## 2014-08-11 DIAGNOSIS — E872 Acidosis, unspecified: Secondary | ICD-10-CM

## 2014-08-11 DIAGNOSIS — N390 Urinary tract infection, site not specified: Secondary | ICD-10-CM | POA: Diagnosis present

## 2014-08-11 DIAGNOSIS — E875 Hyperkalemia: Secondary | ICD-10-CM | POA: Diagnosis present

## 2014-08-11 DIAGNOSIS — Z66 Do not resuscitate: Secondary | ICD-10-CM | POA: Diagnosis present

## 2014-08-11 DIAGNOSIS — A419 Sepsis, unspecified organism: Secondary | ICD-10-CM | POA: Diagnosis not present

## 2014-08-11 DIAGNOSIS — E1100 Type 2 diabetes mellitus with hyperosmolarity without nonketotic hyperglycemic-hyperosmolar coma (NKHHC): Secondary | ICD-10-CM | POA: Diagnosis not present

## 2014-08-11 DIAGNOSIS — Z8673 Personal history of transient ischemic attack (TIA), and cerebral infarction without residual deficits: Secondary | ICD-10-CM

## 2014-08-11 DIAGNOSIS — Z515 Encounter for palliative care: Secondary | ICD-10-CM

## 2014-08-11 DIAGNOSIS — Z7901 Long term (current) use of anticoagulants: Secondary | ICD-10-CM

## 2014-08-11 DIAGNOSIS — R234 Changes in skin texture: Secondary | ICD-10-CM | POA: Diagnosis present

## 2014-08-11 DIAGNOSIS — S91309A Unspecified open wound, unspecified foot, initial encounter: Secondary | ICD-10-CM

## 2014-08-11 DIAGNOSIS — D62 Acute posthemorrhagic anemia: Secondary | ICD-10-CM

## 2014-08-11 DIAGNOSIS — E860 Dehydration: Secondary | ICD-10-CM | POA: Diagnosis present

## 2014-08-11 DIAGNOSIS — E440 Moderate protein-calorie malnutrition: Secondary | ICD-10-CM

## 2014-08-11 DIAGNOSIS — G9341 Metabolic encephalopathy: Secondary | ICD-10-CM

## 2014-08-11 DIAGNOSIS — Z681 Body mass index (BMI) 19 or less, adult: Secondary | ICD-10-CM

## 2014-08-11 DIAGNOSIS — N179 Acute kidney failure, unspecified: Secondary | ICD-10-CM | POA: Diagnosis present

## 2014-08-11 DIAGNOSIS — I4891 Unspecified atrial fibrillation: Secondary | ICD-10-CM | POA: Diagnosis present

## 2014-08-11 DIAGNOSIS — E118 Type 2 diabetes mellitus with unspecified complications: Secondary | ICD-10-CM

## 2014-08-11 DIAGNOSIS — B9620 Unspecified Escherichia coli [E. coli] as the cause of diseases classified elsewhere: Secondary | ICD-10-CM | POA: Diagnosis present

## 2014-08-11 DIAGNOSIS — I10 Essential (primary) hypertension: Secondary | ICD-10-CM | POA: Diagnosis present

## 2014-08-11 DIAGNOSIS — R739 Hyperglycemia, unspecified: Secondary | ICD-10-CM | POA: Diagnosis present

## 2014-08-11 DIAGNOSIS — I739 Peripheral vascular disease, unspecified: Secondary | ICD-10-CM | POA: Diagnosis present

## 2014-08-11 DIAGNOSIS — G92 Toxic encephalopathy: Secondary | ICD-10-CM | POA: Diagnosis present

## 2014-08-11 DIAGNOSIS — I96 Gangrene, not elsewhere classified: Secondary | ICD-10-CM | POA: Diagnosis present

## 2014-08-11 DIAGNOSIS — E1165 Type 2 diabetes mellitus with hyperglycemia: Secondary | ICD-10-CM

## 2014-08-11 DIAGNOSIS — R6520 Severe sepsis without septic shock: Secondary | ICD-10-CM | POA: Diagnosis present

## 2014-08-11 DIAGNOSIS — L8993 Pressure ulcer of unspecified site, stage 3: Secondary | ICD-10-CM

## 2014-08-11 DIAGNOSIS — R64 Cachexia: Secondary | ICD-10-CM | POA: Diagnosis present

## 2014-08-11 DIAGNOSIS — E43 Unspecified severe protein-calorie malnutrition: Secondary | ICD-10-CM | POA: Diagnosis present

## 2014-08-11 DIAGNOSIS — R0689 Other abnormalities of breathing: Secondary | ICD-10-CM

## 2014-08-11 DIAGNOSIS — I480 Paroxysmal atrial fibrillation: Secondary | ICD-10-CM

## 2014-08-11 DIAGNOSIS — E876 Hypokalemia: Secondary | ICD-10-CM | POA: Diagnosis present

## 2014-08-11 DIAGNOSIS — E46 Unspecified protein-calorie malnutrition: Secondary | ICD-10-CM | POA: Diagnosis present

## 2014-08-11 DIAGNOSIS — Z794 Long term (current) use of insulin: Secondary | ICD-10-CM

## 2014-08-11 DIAGNOSIS — L89150 Pressure ulcer of sacral region, unstageable: Secondary | ICD-10-CM | POA: Diagnosis present

## 2014-08-11 DIAGNOSIS — L89153 Pressure ulcer of sacral region, stage 3: Secondary | ICD-10-CM | POA: Diagnosis present

## 2014-08-11 DIAGNOSIS — D509 Iron deficiency anemia, unspecified: Secondary | ICD-10-CM | POA: Diagnosis present

## 2014-08-11 DIAGNOSIS — E1310 Other specified diabetes mellitus with ketoacidosis without coma: Secondary | ICD-10-CM | POA: Diagnosis present

## 2014-08-11 DIAGNOSIS — R4182 Altered mental status, unspecified: Secondary | ICD-10-CM

## 2014-08-11 DIAGNOSIS — L89899 Pressure ulcer of other site, unspecified stage: Secondary | ICD-10-CM | POA: Diagnosis present

## 2014-08-11 DIAGNOSIS — I350 Nonrheumatic aortic (valve) stenosis: Secondary | ICD-10-CM | POA: Diagnosis present

## 2014-08-11 DIAGNOSIS — G9340 Encephalopathy, unspecified: Secondary | ICD-10-CM | POA: Diagnosis present

## 2014-08-11 DIAGNOSIS — F0390 Unspecified dementia without behavioral disturbance: Secondary | ICD-10-CM | POA: Diagnosis present

## 2014-08-11 DIAGNOSIS — D649 Anemia, unspecified: Secondary | ICD-10-CM | POA: Diagnosis present

## 2014-08-11 LAB — CBC WITH DIFFERENTIAL/PLATELET
Basophils Absolute: 0 10*3/uL (ref 0.0–0.1)
Basophils Relative: 0 % (ref 0–1)
EOS ABS: 0 10*3/uL (ref 0.0–0.7)
Eosinophils Relative: 0 % (ref 0–5)
HCT: 29 % — ABNORMAL LOW (ref 36.0–46.0)
Hemoglobin: 9.7 g/dL — ABNORMAL LOW (ref 12.0–15.0)
LYMPHS ABS: 1.5 10*3/uL (ref 0.7–4.0)
Lymphocytes Relative: 6 % — ABNORMAL LOW (ref 12–46)
MCH: 28 pg (ref 26.0–34.0)
MCHC: 33.4 g/dL (ref 30.0–36.0)
MCV: 83.6 fL (ref 78.0–100.0)
Monocytes Absolute: 1.4 10*3/uL — ABNORMAL HIGH (ref 0.1–1.0)
Monocytes Relative: 6 % (ref 3–12)
Neutro Abs: 20.8 10*3/uL — ABNORMAL HIGH (ref 1.7–7.7)
Neutrophils Relative %: 88 % — ABNORMAL HIGH (ref 43–77)
Platelets: 786 10*3/uL — ABNORMAL HIGH (ref 150–400)
RBC: 3.47 MIL/uL — ABNORMAL LOW (ref 3.87–5.11)
RDW: 14.9 % (ref 11.5–15.5)
WBC: 23.7 10*3/uL — ABNORMAL HIGH (ref 4.0–10.5)

## 2014-08-11 LAB — COMPREHENSIVE METABOLIC PANEL
ALT: 16 U/L (ref 0–35)
ANION GAP: 28 — AB (ref 5–15)
AST: 14 U/L (ref 0–37)
Albumin: 1.6 g/dL — ABNORMAL LOW (ref 3.5–5.2)
Alkaline Phosphatase: 118 U/L — ABNORMAL HIGH (ref 39–117)
BUN: 47 mg/dL — ABNORMAL HIGH (ref 6–23)
CO2: 16 mEq/L — ABNORMAL LOW (ref 19–32)
Calcium: 8.2 mg/dL — ABNORMAL LOW (ref 8.4–10.5)
Chloride: 94 mEq/L — ABNORMAL LOW (ref 96–112)
Creatinine, Ser: 0.84 mg/dL (ref 0.50–1.10)
GFR calc Af Amer: 69 mL/min — ABNORMAL LOW (ref >90)
GFR calc non Af Amer: 59 mL/min — ABNORMAL LOW (ref >90)
GLUCOSE: 318 mg/dL — AB (ref 70–99)
Potassium: 5.6 mEq/L — ABNORMAL HIGH (ref 3.7–5.3)
SODIUM: 138 meq/L (ref 137–147)
Total Bilirubin: 0.7 mg/dL (ref 0.3–1.2)
Total Protein: 5.5 g/dL — ABNORMAL LOW (ref 6.0–8.3)

## 2014-08-11 LAB — URINALYSIS, ROUTINE W REFLEX MICROSCOPIC
Bilirubin Urine: NEGATIVE
GLUCOSE, UA: NEGATIVE mg/dL
Hgb urine dipstick: NEGATIVE
Ketones, ur: 15 mg/dL — AB
NITRITE: NEGATIVE
PROTEIN: NEGATIVE mg/dL
Specific Gravity, Urine: 1.021 (ref 1.005–1.030)
UROBILINOGEN UA: 1 mg/dL (ref 0.0–1.0)
pH: 5 (ref 5.0–8.0)

## 2014-08-11 LAB — URINE MICROSCOPIC-ADD ON

## 2014-08-11 LAB — I-STAT CG4 LACTIC ACID, ED: Lactic Acid, Venous: 2.81 mmol/L — ABNORMAL HIGH (ref 0.5–2.2)

## 2014-08-11 MED ORDER — DEXTROSE 5 % IV SOLN
1.0000 g | Freq: Once | INTRAVENOUS | Status: AC
  Administered 2014-08-11: 1 g via INTRAVENOUS
  Filled 2014-08-11: qty 1

## 2014-08-11 MED ORDER — VANCOMYCIN HCL IN DEXTROSE 1-5 GM/200ML-% IV SOLN
1000.0000 mg | Freq: Once | INTRAVENOUS | Status: AC
  Administered 2014-08-12: 1000 mg via INTRAVENOUS
  Filled 2014-08-11: qty 200

## 2014-08-11 MED ORDER — SODIUM CHLORIDE 0.9 % IV BOLUS (SEPSIS)
1000.0000 mL | Freq: Once | INTRAVENOUS | Status: AC
  Administered 2014-08-11: 1000 mL via INTRAVENOUS

## 2014-08-11 NOTE — ED Notes (Signed)
Presents from [facility] with altered mental status, pt is responsive to voice, blank stare, hr 120s, last seen normal Friday. Normally alert and oriented x4. Nonverbal, moans only. WBC greater than 20. Pt DNR

## 2014-08-11 NOTE — Progress Notes (Signed)
MRN: [id number] Name: Lorraine Tran  Sex: female Age: 80 y.o. DOB: Sep 10, 1922  [name] Facility[facility] Level Of Care: SNF Provider: Merrilee SeashoreALEXANDER, Nadra D Emergency Contacts: Extended Emergency Contact Information Primary Emergency Contact: Cira ServantVanalstyne,[place]abell  [place] Home Phone: [phone] Work Phone: [phone] Relation: Daughter  Code Status: DNR  Allergies: Penicillins  Chief Complaint  Patient presents with  . Acute Visit    HPI: Patient is 80 y.o. female who nursing asked me to see for an acute change in condition just today.  Past Medical History  Diagnosis Date  . Diabetes mellitus   . Dementia   . Stroke   . Atrial fibrillation 06/06/2012    Eliquis started 8/13  . Aortic stenosis     a. Echo 6/13:  mild LVH, EF 65-70%, LV vigorous, mod AS, mean 26 mmHg, MAC, mild MS, mean 4 mmHg, mild LAE, PASP 48    Past Surgical History  Procedure Laterality Date  . Abdominal hysterectomy    . Cholecystectomy    . Tibia im nail insertion Left 07/06/2014    Procedure: INTRAMEDULLARY (IM) NAIL TIBIAL;  Surgeon: Kathryne Hitchhristopher Y Blackman, MD;  Location: [facility] OR;  Service: Orthopedics;  Laterality: Left;  . Tibia im nail insertion Right 07/06/2014    Procedure: INTRAMEDULLARY (IM) NAIL TIBIAL;  Surgeon: Kathryne Hitchhristopher Y Blackman, MD;  Location: [facility] OR;  Service: Orthopedics;  Laterality: Right;      Medication List       This list is accurate as of: 08/11/14 10:12 PM.  Always use your most recent med list.               ELIQUIS 2.5 MG Tabs tablet  Generic drug:  apixaban  Take 2.5 mg by mouth 2 (two) times daily.     HYDROcodone-acetaminophen 5-325 MG per tablet  Commonly known as:  NORCO  Take 1 tablet by mouth every 6 (six) hours as needed for moderate pain.     insulin glargine 100 UNIT/ML injection  Commonly known as:  LANTUS  Inject 24 Units into the skin daily before supper.     metoprolol tartrate 25 MG tablet  Commonly known  as:  LOPRESSOR  Take 25 mg by mouth 2 (two) times daily.     sitaGLIPtin 100 MG tablet  Commonly known as:  JANUVIA  Take 100 mg by mouth daily.        No orders of the defined types were placed in this encounter.    Immunization History  Administered Date(s) Administered  . Influenza,inj,Quad PF,36+ Mos 07/09/2014  . Pneumococcal Polysaccharide-23 04/24/2012    History  Substance Use Topics  . Smoking status: Never Smoker   . Smokeless tobacco: Never Used  . Alcohol Use: No    Review of Systems  DATA OBTAINED: per nurse new onset of decreased mental status today;no specific sx such as cough, foul urine or change in sacral decubitus   Filed Vitals:   08/11/14 1413  BP: 125/63  Pulse: 89  Temp: 97.9 F (36.6 C)  Resp: 22    Physical Exam  GENERAL APPEARANCE: semi alert, nonconversant SKIN: No diaphoresis;wound dressed, no heat  HEENT: Unremarkable RESPIRATORY: RR increased, mild labored. Lung sounds are decreased diffusely  CARDIOVASCULAR: Heart RRR no murmurs, rubs or gallops. No peripheral edema  GASTROINTESTINAL: Abdomen is soft, non-tender, not distended w/ normal bowel sounds.  GENITOURINARY: Bladder non tender, not distended  MUSCULOSKELETAL: No abnormal joints or musculature NEUROLOGIC: Cranial nerves 2-12 grossly intact PSYCHIATRIC:unable to assess  Patient Active Problem List   Diagnosis Date Noted  . Severe sepsis 08/12/2014  . Metabolic acidosis 08/12/2014  . DM hyperosmolarity type II, uncontrolled 08/12/2014  . Acute encephalopathy 08/12/2014  . Decubitus ulcer of sacral region, stage 3 08/12/2014  . Anemia 08/12/2014  . Hyperkalemia 08/12/2014  . Acute kidney injury 08/12/2014  . UTI (urinary tract infection) 08/12/2014  . Eschar of lower leg 08/12/2014  . Lactic acidosis 08/12/2014  . Traumatic closed nondisplaced fracture of distal end of right tibia 07/10/2014  . Heel fracture 07/10/2014  . Acute blood loss anemia 07/10/2014  . Fall  07/04/2014  . Fracture of left tibia and fibula 07/04/2014  . Encephalopathy 07/04/2014  . Hyperglycemia 07/04/2014  . Atrial fibrillation 06/06/2012  . Symptomatic bradycardia 04/23/2012  . Elevated cholesterol 04/23/2012  . Elevated LDL cholesterol level 04/23/2012  . Aortic stenosis 04/23/2012  . Dementia 04/23/2012  . Leukocytosis 04/23/2012  . Dehydration 04/23/2012  . Lumbar compression fracture 04/23/2012  . Syncope 04/22/2012  . HTN (hypertension) 04/22/2012    CBC    Component Value Date/Time   WBC 12.1* 08/14/2014 0308   RBC 3.36* 08/14/2014 0308   RBC 2.85* 07/05/2014 0955   HGB 9.1* 08/14/2014 0308   HCT 27.9* 08/14/2014 0308   PLT 684* 08/14/2014 0308   MCV 83.0 08/14/2014 0308   LYMPHSABS 1.5 08/11/2014 2255   MONOABS 1.4* 08/11/2014 2255   EOSABS 0.0 08/11/2014 2255   BASOSABS 0.0 08/11/2014 2255    CMP     Component Value Date/Time   NA 140 08/14/2014 0308   K 3.8 08/14/2014 1441   CL 103 08/14/2014 0308   CO2 27 08/14/2014 0308   GLUCOSE 123* 08/14/2014 0308   BUN 10 08/14/2014 0308   CREATININE 0.31* 08/14/2014 0308   CALCIUM 7.4* 08/14/2014 0308   PROT 4.8* 08/14/2014 0308   ALBUMIN 1.4* 08/14/2014 0308   AST 13 08/14/2014 0308   ALT 12 08/14/2014 0308   ALKPHOS 86 08/14/2014 0308   BILITOT 0.5 08/14/2014 0308   GFRNONAA >90 08/14/2014 0308   GFRAA >90 08/14/2014 0308    Assessment and Plan  MENTAL STATUS CHANGE - with increased RR that suggests PNA as a source. Consider urine and decubitus ulcer as potential. STAT CBC, BMP, CXR and U/A  Merrilee SeashoreALEXANDER, Dakiyah D, MD  Late entry Today reviewed pt's labs and was told that she was sent to ED after WBC returned at 23,000

## 2014-08-11 NOTE — ED Notes (Signed)
MD at bedside. 

## 2014-08-11 NOTE — ED Notes (Signed)
Contacted [facility] to call Code Sepsis Level 2 @ [phone] kt

## 2014-08-12 ENCOUNTER — Inpatient Hospital Stay (HOSPITAL_COMMUNITY): Payer: Medicare Other

## 2014-08-12 DIAGNOSIS — Z7901 Long term (current) use of anticoagulants: Secondary | ICD-10-CM | POA: Diagnosis not present

## 2014-08-12 DIAGNOSIS — L89153 Pressure ulcer of sacral region, stage 3: Secondary | ICD-10-CM | POA: Diagnosis present

## 2014-08-12 DIAGNOSIS — R234 Changes in skin texture: Secondary | ICD-10-CM | POA: Diagnosis present

## 2014-08-12 DIAGNOSIS — A419 Sepsis, unspecified organism: Secondary | ICD-10-CM | POA: Diagnosis present

## 2014-08-12 DIAGNOSIS — E43 Unspecified severe protein-calorie malnutrition: Secondary | ICD-10-CM | POA: Diagnosis present

## 2014-08-12 DIAGNOSIS — D649 Anemia, unspecified: Secondary | ICD-10-CM | POA: Diagnosis present

## 2014-08-12 DIAGNOSIS — L89150 Pressure ulcer of sacral region, unstageable: Secondary | ICD-10-CM | POA: Diagnosis present

## 2014-08-12 DIAGNOSIS — E876 Hypokalemia: Secondary | ICD-10-CM | POA: Diagnosis present

## 2014-08-12 DIAGNOSIS — R6520 Severe sepsis without septic shock: Secondary | ICD-10-CM

## 2014-08-12 DIAGNOSIS — I96 Gangrene, not elsewhere classified: Secondary | ICD-10-CM | POA: Diagnosis present

## 2014-08-12 DIAGNOSIS — N179 Acute kidney failure, unspecified: Secondary | ICD-10-CM | POA: Diagnosis present

## 2014-08-12 DIAGNOSIS — E1165 Type 2 diabetes mellitus with hyperglycemia: Secondary | ICD-10-CM

## 2014-08-12 DIAGNOSIS — E875 Hyperkalemia: Secondary | ICD-10-CM | POA: Diagnosis present

## 2014-08-12 DIAGNOSIS — N390 Urinary tract infection, site not specified: Secondary | ICD-10-CM | POA: Diagnosis present

## 2014-08-12 DIAGNOSIS — E46 Unspecified protein-calorie malnutrition: Secondary | ICD-10-CM | POA: Diagnosis present

## 2014-08-12 DIAGNOSIS — Z66 Do not resuscitate: Secondary | ICD-10-CM | POA: Diagnosis present

## 2014-08-12 DIAGNOSIS — G92 Toxic encephalopathy: Secondary | ICD-10-CM | POA: Diagnosis present

## 2014-08-12 DIAGNOSIS — E872 Acidosis, unspecified: Secondary | ICD-10-CM | POA: Diagnosis present

## 2014-08-12 DIAGNOSIS — Z794 Long term (current) use of insulin: Secondary | ICD-10-CM | POA: Diagnosis not present

## 2014-08-12 DIAGNOSIS — I10 Essential (primary) hypertension: Secondary | ICD-10-CM | POA: Diagnosis present

## 2014-08-12 DIAGNOSIS — G9340 Encephalopathy, unspecified: Secondary | ICD-10-CM | POA: Diagnosis present

## 2014-08-12 DIAGNOSIS — D509 Iron deficiency anemia, unspecified: Secondary | ICD-10-CM | POA: Diagnosis present

## 2014-08-12 DIAGNOSIS — I739 Peripheral vascular disease, unspecified: Secondary | ICD-10-CM | POA: Diagnosis present

## 2014-08-12 DIAGNOSIS — E1100 Type 2 diabetes mellitus with hyperosmolarity without nonketotic hyperglycemic-hyperosmolar coma (NKHHC): Secondary | ICD-10-CM

## 2014-08-12 DIAGNOSIS — I350 Nonrheumatic aortic (valve) stenosis: Secondary | ICD-10-CM | POA: Diagnosis present

## 2014-08-12 DIAGNOSIS — Z515 Encounter for palliative care: Secondary | ICD-10-CM | POA: Diagnosis not present

## 2014-08-12 DIAGNOSIS — Z8673 Personal history of transient ischemic attack (TIA), and cerebral infarction without residual deficits: Secondary | ICD-10-CM | POA: Diagnosis not present

## 2014-08-12 DIAGNOSIS — E1310 Other specified diabetes mellitus with ketoacidosis without coma: Secondary | ICD-10-CM | POA: Diagnosis present

## 2014-08-12 DIAGNOSIS — L89899 Pressure ulcer of other site, unspecified stage: Secondary | ICD-10-CM | POA: Diagnosis present

## 2014-08-12 DIAGNOSIS — F0390 Unspecified dementia without behavioral disturbance: Secondary | ICD-10-CM | POA: Diagnosis present

## 2014-08-12 DIAGNOSIS — Z681 Body mass index (BMI) 19 or less, adult: Secondary | ICD-10-CM | POA: Diagnosis not present

## 2014-08-12 DIAGNOSIS — R64 Cachexia: Secondary | ICD-10-CM | POA: Diagnosis present

## 2014-08-12 DIAGNOSIS — B9620 Unspecified Escherichia coli [E. coli] as the cause of diseases classified elsewhere: Secondary | ICD-10-CM | POA: Diagnosis present

## 2014-08-12 DIAGNOSIS — I4891 Unspecified atrial fibrillation: Secondary | ICD-10-CM | POA: Diagnosis present

## 2014-08-12 LAB — BASIC METABOLIC PANEL
ANION GAP: 22 — AB (ref 5–15)
ANION GAP: 14 (ref 5–15)
ANION GAP: 13 (ref 5–15)
ANION GAP: 12 (ref 5–15)
Anion gap: 27 — ABNORMAL HIGH (ref 5–15)
BUN: 42 mg/dL — ABNORMAL HIGH (ref 6–23)
BUN: 44 mg/dL — ABNORMAL HIGH (ref 6–23)
BUN: 44 mg/dL — ABNORMAL HIGH (ref 6–23)
BUN: 46 mg/dL — AB (ref 6–23)
BUN: 47 mg/dL — ABNORMAL HIGH (ref 6–23)
CHLORIDE: 103 meq/L (ref 96–112)
CHLORIDE: 103 meq/L (ref 96–112)
CHLORIDE: 104 meq/L (ref 96–112)
CHLORIDE: 94 meq/L — AB (ref 96–112)
CHLORIDE: 98 meq/L (ref 96–112)
CO2: 17 meq/L — AB (ref 19–32)
CO2: 20 mEq/L (ref 19–32)
CO2: 23 mEq/L (ref 19–32)
CO2: 24 mEq/L (ref 19–32)
CO2: 24 mEq/L (ref 19–32)
CREATININE: 0.55 mg/dL (ref 0.50–1.10)
CREATININE: 0.77 mg/dL (ref 0.50–1.10)
Calcium: 7.9 mg/dL — ABNORMAL LOW (ref 8.4–10.5)
Calcium: 8.1 mg/dL — ABNORMAL LOW (ref 8.4–10.5)
Calcium: 7.9 mg/dL — ABNORMAL LOW (ref 8.4–10.5)
Calcium: 7.8 mg/dL — ABNORMAL LOW (ref 8.4–10.5)
Calcium: 7.9 mg/dL — ABNORMAL LOW (ref 8.4–10.5)
Creatinine, Ser: 0.75 mg/dL (ref 0.50–1.10)
Creatinine, Ser: 0.59 mg/dL (ref 0.50–1.10)
Creatinine, Ser: 0.58 mg/dL (ref 0.50–1.10)
GFR calc Af Amer: 83 mL/min — ABNORMAL LOW (ref >90)
GFR calc non Af Amer: 72 mL/min — ABNORMAL LOW (ref >90)
GFR calc non Af Amer: 72 mL/min — ABNORMAL LOW (ref >90)
GFR calc non Af Amer: 78 mL/min — ABNORMAL LOW (ref >90)
GFR calc non Af Amer: 79 mL/min — ABNORMAL LOW (ref >90)
GFR calc non Af Amer: 80 mL/min — ABNORMAL LOW (ref >90)
GFR, EST AFRICAN AMERICAN: 84 mL/min — AB (ref >90)
Glucose, Bld: 389 mg/dL — ABNORMAL HIGH (ref 70–99)
Glucose, Bld: 335 mg/dL — ABNORMAL HIGH (ref 70–99)
Glucose, Bld: 145 mg/dL — ABNORMAL HIGH (ref 70–99)
Glucose, Bld: 111 mg/dL — ABNORMAL HIGH (ref 70–99)
Glucose, Bld: 99 mg/dL (ref 70–99)
POTASSIUM: 4.5 meq/L (ref 3.7–5.3)
POTASSIUM: 4.7 meq/L (ref 3.7–5.3)
POTASSIUM: 5.2 meq/L (ref 3.7–5.3)
Potassium: 4.8 mEq/L (ref 3.7–5.3)
Potassium: 4.8 mEq/L (ref 3.7–5.3)
Sodium: 138 mEq/L (ref 137–147)
Sodium: 140 mEq/L (ref 137–147)
Sodium: 141 mEq/L (ref 137–147)
Sodium: 140 mEq/L (ref 137–147)
Sodium: 139 mEq/L (ref 137–147)

## 2014-08-12 LAB — CBG MONITORING, ED
GLUCOSE-CAPILLARY: 310 mg/dL — AB (ref 70–99)
GLUCOSE-CAPILLARY: 196 mg/dL — AB (ref 70–99)
GLUCOSE-CAPILLARY: 161 mg/dL — AB (ref 70–99)
GLUCOSE-CAPILLARY: 89 mg/dL (ref 70–99)
Glucose-Capillary: 342 mg/dL — ABNORMAL HIGH (ref 70–99)
Glucose-Capillary: 284 mg/dL — ABNORMAL HIGH (ref 70–99)
Glucose-Capillary: 257 mg/dL — ABNORMAL HIGH (ref 70–99)
Glucose-Capillary: 132 mg/dL — ABNORMAL HIGH (ref 70–99)
Glucose-Capillary: 117 mg/dL — ABNORMAL HIGH (ref 70–99)

## 2014-08-12 LAB — CBC
HCT: 27.9 % — ABNORMAL LOW (ref 36.0–46.0)
HCT: 27.8 % — ABNORMAL LOW (ref 36.0–46.0)
HEMOGLOBIN: 9 g/dL — AB (ref 12.0–15.0)
HEMOGLOBIN: 9.1 g/dL — AB (ref 12.0–15.0)
MCH: 27.4 pg (ref 26.0–34.0)
MCH: 27.7 pg (ref 26.0–34.0)
MCHC: 32.4 g/dL (ref 30.0–36.0)
MCHC: 32.6 g/dL (ref 30.0–36.0)
MCV: 84 fL (ref 78.0–100.0)
MCV: 85.5 fL (ref 78.0–100.0)
Platelets: 789 10*3/uL — ABNORMAL HIGH (ref 150–400)
Platelets: 751 10*3/uL — ABNORMAL HIGH (ref 150–400)
RBC: 3.25 MIL/uL — AB (ref 3.87–5.11)
RBC: 3.32 MIL/uL — AB (ref 3.87–5.11)
RDW: 15.1 % (ref 11.5–15.5)
RDW: 14.8 % (ref 11.5–15.5)
WBC: 22.1 10*3/uL — ABNORMAL HIGH (ref 4.0–10.5)
WBC: 22.5 10*3/uL — ABNORMAL HIGH (ref 4.0–10.5)

## 2014-08-12 LAB — LACTIC ACID, PLASMA: Lactic Acid, Venous: 4.3 mmol/L — ABNORMAL HIGH (ref 0.5–2.2)

## 2014-08-12 LAB — GLUCOSE, CAPILLARY: GLUCOSE-CAPILLARY: 158 mg/dL — AB (ref 70–99)

## 2014-08-12 LAB — MRSA PCR SCREENING: MRSA by PCR: NEGATIVE

## 2014-08-12 MED ORDER — FERROUS SULFATE 325 (65 FE) MG PO TABS
325.0000 mg | ORAL_TABLET | Freq: Every day | ORAL | Status: DC
  Filled 2014-08-12 (×3): qty 1

## 2014-08-12 MED ORDER — ACETAMINOPHEN 650 MG RE SUPP: 650.0000 mg | Freq: Four times a day (QID) | RECTAL | Status: DC | PRN

## 2014-08-12 MED ORDER — ENOXAPARIN SODIUM 40 MG/0.4ML ~~LOC~~ SOLN: 40.0000 mg | SUBCUTANEOUS | Status: DC

## 2014-08-12 MED ORDER — ACETAMINOPHEN 325 MG PO TABS
650.0000 mg | ORAL_TABLET | Freq: Four times a day (QID) | ORAL | Status: DC | PRN
  Administered 2014-08-19: 650 mg via ORAL
  Filled 2014-08-12: qty 2

## 2014-08-12 MED ORDER — METOPROLOL TARTRATE 25 MG PO TABS
25.0000 mg | ORAL_TABLET | Freq: Two times a day (BID) | ORAL | Status: DC
  Filled 2014-08-12 (×3): qty 1

## 2014-08-12 MED ORDER — SODIUM CHLORIDE 0.9 % IV SOLN
INTRAVENOUS | Status: DC
  Administered 2014-08-13: 1000 mL via INTRAVENOUS
  Administered 2014-08-13 (×2): via INTRAVENOUS
  Administered 2014-08-14: 1000 mL via INTRAVENOUS
  Administered 2014-08-15: 75 mL/h via INTRAVENOUS

## 2014-08-12 MED ORDER — MAGNESIUM HYDROXIDE 400 MG/5ML PO SUSP
5.0000 mL | Freq: Every day | ORAL | Status: DC
  Filled 2014-08-12: qty 30

## 2014-08-12 MED ORDER — SODIUM CHLORIDE 0.9 % IJ SOLN
3.0000 mL | Freq: Two times a day (BID) | INTRAMUSCULAR | Status: DC
  Administered 2014-08-13 – 2014-08-22 (×17): 3 mL via INTRAVENOUS

## 2014-08-12 MED ORDER — DEXTROSE-NACL 5-0.45 % IV SOLN
INTRAVENOUS | Status: DC
  Administered 2014-08-12: 06:00:00 via INTRAVENOUS

## 2014-08-12 MED ORDER — COLLAGENASE 250 UNIT/GM EX OINT
TOPICAL_OINTMENT | Freq: Every day | CUTANEOUS | Status: DC
  Administered 2014-08-13 – 2014-08-17 (×4): via TOPICAL
  Administered 2014-08-18: 1 via TOPICAL
  Administered 2014-08-19: 10:00:00 via TOPICAL
  Filled 2014-08-12 (×3): qty 30

## 2014-08-12 MED ORDER — CEFEPIME HCL 1 G IJ SOLR
1.0000 g | Freq: Two times a day (BID) | INTRAMUSCULAR | Status: DC
  Administered 2014-08-12 – 2014-08-14 (×5): 1 g via INTRAVENOUS
  Filled 2014-08-12 (×6): qty 1

## 2014-08-12 MED ORDER — SODIUM CHLORIDE 0.9 % IV SOLN
INTRAVENOUS | Status: DC
  Administered 2014-08-12: 2.8 [IU]/h via INTRAVENOUS
  Filled 2014-08-12: qty 2.5

## 2014-08-12 MED ORDER — MIRTAZAPINE 7.5 MG PO TABS
7.5000 mg | ORAL_TABLET | Freq: Every day | ORAL | Status: DC
  Administered 2014-08-14 – 2014-08-21 (×8): 7.5 mg via ORAL
  Filled 2014-08-12 (×11): qty 1

## 2014-08-12 MED ORDER — INSULIN ASPART 100 UNIT/ML ~~LOC~~ SOLN
0.0000 [IU] | SUBCUTANEOUS | Status: DC
Start: 2014-08-12 — End: 2014-08-18
  Administered 2014-08-12: 2 [IU] via SUBCUTANEOUS
  Administered 2014-08-13: 1 [IU] via SUBCUTANEOUS
  Administered 2014-08-13 – 2014-08-14 (×5): 2 [IU] via SUBCUTANEOUS
  Administered 2014-08-14: 1 [IU] via SUBCUTANEOUS
  Administered 2014-08-15: 2 [IU] via SUBCUTANEOUS
  Administered 2014-08-15 (×2): 1 [IU] via SUBCUTANEOUS
  Administered 2014-08-15 – 2014-08-16 (×4): 2 [IU] via SUBCUTANEOUS
  Administered 2014-08-16 (×2): 1 [IU] via SUBCUTANEOUS
  Administered 2014-08-16 – 2014-08-17 (×2): 2 [IU] via SUBCUTANEOUS
  Administered 2014-08-17 (×2): 1 [IU] via SUBCUTANEOUS
  Administered 2014-08-17 – 2014-08-18 (×3): 2 [IU] via SUBCUTANEOUS
  Administered 2014-08-18 (×3): 1 [IU] via SUBCUTANEOUS

## 2014-08-12 MED ORDER — APIXABAN 2.5 MG PO TABS
2.5000 mg | ORAL_TABLET | Freq: Two times a day (BID) | ORAL | Status: DC
  Filled 2014-08-12 (×4): qty 1

## 2014-08-12 MED ORDER — MIRTAZAPINE 15 MG PO TBDP
7.5000 mg | ORAL_TABLET | Freq: Every day | ORAL | Status: DC
  Filled 2014-08-12: qty 0.5

## 2014-08-12 MED ORDER — ONDANSETRON HCL 4 MG/2ML IJ SOLN: 4.0000 mg | Freq: Four times a day (QID) | INTRAMUSCULAR | Status: DC | PRN

## 2014-08-12 MED ORDER — INSULIN GLARGINE 100 UNIT/ML ~~LOC~~ SOLN
10.0000 [IU] | Freq: Once | SUBCUTANEOUS | Status: AC
  Administered 2014-08-12: 10 [IU] via SUBCUTANEOUS
  Filled 2014-08-12: qty 0.1

## 2014-08-12 MED ORDER — SODIUM CHLORIDE 0.9 % IV SOLN
INTRAVENOUS | Status: DC
  Administered 2014-08-12: 100 mL/h via INTRAVENOUS
  Administered 2014-08-12: 1000 mL via INTRAVENOUS

## 2014-08-12 MED ORDER — POTASSIUM CHLORIDE 10 MEQ/100ML IV SOLN
10.0000 meq | INTRAVENOUS | Status: AC
  Administered 2014-08-12 (×2): 10 meq via INTRAVENOUS
  Filled 2014-08-12 (×2): qty 100

## 2014-08-12 MED ORDER — ONDANSETRON HCL 4 MG PO TABS: 4.0000 mg | ORAL_TABLET | Freq: Four times a day (QID) | ORAL | Status: DC | PRN

## 2014-08-12 MED ORDER — DEXTROSE 50 % IV SOLN
25.0000 mL | INTRAVENOUS | Status: DC | PRN
Start: 2014-08-12 — End: 2014-08-22

## 2014-08-12 MED ORDER — VANCOMYCIN HCL IN DEXTROSE 750-5 MG/150ML-% IV SOLN
750.0000 mg | INTRAVENOUS | Status: DC
  Administered 2014-08-13 – 2014-08-14 (×2): 750 mg via INTRAVENOUS
  Filled 2014-08-12 (×3): qty 150

## 2014-08-12 NOTE — ED Notes (Signed)
Called Dr. Payton MccallumEl

## 2014-08-12 NOTE — ED Notes (Signed)
Pt placed on air overlay mattress 

## 2014-08-12 NOTE — ED Notes (Addendum)
Wound nurse at bedside, pt responding to voice with blank stare.

## 2014-08-12 NOTE — H&P (Addendum)
[facility] History and Physical  Donata Claynne M Kimm WUJ:[id number] DOB: November 06, 1921 DOA: 08/11/2014  Referring physician: ED PCP: Darrow BussingKOIRALA,DIBAS, MD   Patient seen and examined on 08/12/2014  Chief Complaint:  Sent from [facility] for AMS  Hx provided by ED physician and daughter on the phone  HPI:  80 year old female with history of A. fib on Eliquis, type 2 diabetes mellitus with A1c of 6.7, recently started on insulin, hx of stroke, sacral decubitus, dementia, recent hospitalization for right tibial fracture status post repair and discharged to skilled nursing facility was sent to the ED with altered mental status noted today.  patient is confused and nonverbal, unable to provide any history. Patient reportedly was confused, lethargic and poorly responsive in the nursing home. Upon arrival to the ED she had a low-grade temperature of 99.8  Fahrenheit, tachycardic to 128, tachypnea of the 20 with normal blood pressure. O2 sat was maintained. Blood work done showed WBC of 23.7 K., hemoglobin of 9.7, platelets of 786.  History short sodium 138, potassium 5.6, chloride 94, CO2 16 with anion gap of 28. BUN of 47 and glucose of 318. UA was suggestive of UTI  Along with positive ketones. She  also was found to have foul-smelling sacral decubitus ulcer.  Patient was given monitor IV normal saline bolus, 1 dose of IV vancomycin and cefepime for sepsis and hospitalist  admission requested to step down unit. Blood cultures and urine cultures sent from the ED. Marland Kitchen. Chest x-ray and head CT unremarkable. No history of fever, chills, nausea, vomiting, bowel or urinary symptoms noted.   At baseline per daughter she has mild dementia but seems quite oriented. She saw her 1 day prior to admission and was at her baseline. She was participating with PT at SNF . She has been eating well as well. As per daughter , patient has new bedsores in the back starting only 3 weeks back. Also has new eschars in  b/l foot which daughter thinks is from the cast/ dressing.  Review of Systems:  As outlined in history of present illness. Limited due to patient's altered mental status   Past Medical History  Diagnosis Date  . Diabetes mellitus   . Dementia   . Stroke   . Atrial fibrillation 06/06/2012    Eliquis started 8/13  . Aortic stenosis     a. Echo 6/13:  mild LVH, EF 65-70%, LV vigorous, mod AS, mean 26 mmHg, MAC, mild MS, mean 4 mmHg, mild LAE, PASP 48   Past Surgical History  Procedure Laterality Date  . Abdominal hysterectomy    . Cholecystectomy    . Tibia im nail insertion Left 07/06/2014    Procedure: INTRAMEDULLARY (IM) NAIL TIBIAL;  Surgeon: Kathryne Hitchhristopher Y Blackman, MD;  Location: [facility] OR;  Service: Orthopedics;  Laterality: Left;  . Tibia im nail insertion Right 07/06/2014    Procedure: INTRAMEDULLARY (IM) NAIL TIBIAL;  Surgeon: Kathryne Hitchhristopher Y Blackman, MD;  Location: [facility] OR;  Service: Orthopedics;  Laterality: Right;   Social History:  reports that she has never smoked. She has never used smokeless tobacco. She reports that she does not drink alcohol or use illicit drugs.  Allergies  Allergen Reactions  . Penicillins Other (See Comments)    Unknown reaction    No family history on file.  Prior to Admission medications   Medication Sig Start Date End Date Taking? Authorizing Provider  acetaminophen (TYLENOL) 325 MG tablet Take 650 mg by mouth every 4 (four) hours as needed for  mild pain.   Yes Historical Provider, MD  apixaban (ELIQUIS) 2.5 MG TABS tablet Take 2.5 mg by mouth 2 (two) times daily.   Yes Historical Provider, MD  bisacodyl (DULCOLAX) 10 MG suppository Place 10 mg rectally daily as needed for mild constipation or moderate constipation.   Yes Historical Provider, MD  escitalopram (LEXAPRO) 5 MG tablet Take 5 mg by mouth daily.   Yes Historical Provider, MD  ferrous sulfate 325 (65 FE) MG tablet Take 325 mg by mouth daily with breakfast.   Yes Historical Provider, MD   HYDROcodone-acetaminophen (NORCO/VICODIN) 5-325 MG per tablet Take 1-2 tablets by mouth every 6 (six) hours as needed for moderate pain.   Yes Historical Provider, MD  insulin glargine (LANTUS) 100 UNIT/ML injection Inject 24 Units into the skin daily before supper.    Yes Historical Provider, MD  Magnesium Hydroxide (MILK OF MAGNESIA PO) Take 30 mLs by mouth daily as needed (no BM in 3 days).   Yes Historical Provider, MD  metoprolol tartrate (LOPRESSOR) 25 MG tablet Take 25 mg by mouth 2 (two) times daily.   Yes Historical Provider, MD  mirtazapine (REMERON SOL-TAB) 15 MG disintegrating tablet Take 7.5 mg by mouth at bedtime.   Yes Historical Provider, MD  sitaGLIPtin (JANUVIA) 100 MG tablet Take 100 mg by mouth daily.   Yes Historical Provider, MD  Sodium Phosphates (RA SALINE ENEMA RE) Place 1 Applicatorful rectally daily as needed (constipation).   Yes Historical Provider, MD     Physical Exam:  Filed Vitals:   08/11/14 2330 08/11/14 2345 08/12/14 0000 08/12/14 0015  BP: 137/56 140/54 143/70 151/63  Pulse: 111 110 106 115  Temp:      TempSrc:      Resp: 15 18 18 21   Height:      Weight:      SpO2: 100% 100% 100% 100%    Constitutional: Vital signs reviewed.  Patient is an elderly female non verbal, confused by awake HEENT: no pallor, , dry  oral mucosa,  Cardiovascular: S1 & S2 tachycardic  no MRG Chest: CTAB, no wheezes, rales, or rhonchi Abdominal: Soft. Non-tender, non-distended, bowel sounds are normal,  GU: no CVA tenderness Ext: warm, b/l eschar over the foot without discharge but small area of bleeding. Foul smelling sacral decubitus ( stage 3 ) with foul smell Neurological: A&O x0, moving all extremities  Labs on Admission:  Basic Metabolic Panel:  Recent Labs Lab 08/11/14 2255  NA 138  K 5.6*  CL 94*  CO2 16*  GLUCOSE 318*  BUN 47*  CREATININE 0.84  CALCIUM 8.2*   Liver Function Tests:  Recent Labs Lab 08/11/14 2255  AST 14  ALT 16  ALKPHOS 118*   BILITOT 0.7  PROT 5.5*  ALBUMIN 1.6*   No results found for this basename: LIPASE, AMYLASE,  in the last 168 hours No results found for this basename: AMMONIA,  in the last 168 hours CBC:  Recent Labs Lab 08/11/14 2255  WBC 23.7*  NEUTROABS 20.8*  HGB 9.7*  HCT 29.0*  MCV 83.6  PLT 786*   Cardiac Enzymes: No results found for this basename: CKTOTAL, CKMB, CKMBINDEX, TROPONINI,  in the last 168 hours BNP: No components found with this basename: POCBNP,  CBG: No results found for this basename: GLUCAP,  in the last 168 hours  Radiological Exams on Admission: Dg Chest Portable 1 View  08/11/2014   CLINICAL DATA:  [age] year old female with altered mental status. atrial fibrillation. Initial encounter.  EXAM: PORTABLE CHEST - 1 VIEW  COMPARISON:  Portable chest radiograph 07/04/2014.  FINDINGS: Portable AP semi upright view at 2314 hrs. Stable lung volumes. Allowing for portable technique, the lungs are clear. Normal cardiac size and mediastinal contours. Visualized tracheal air column is within normal limits.  IMPRESSION: No acute cardiopulmonary abnormality.   Electronically Signed   By: Augusto Gamble M.D.   On: 08/11/2014 23:23    EKG: Sinus tachycardia with mild ST depression  Assessment/Plan  Principal Problem:   Severe sepsis Admit to stepdown. Likely a combination of UTI and infected sacral decubitus Continue empiric vancomycin and cefepime. Follow blood culture and urine cultures in the ED. Will order wound culture from sacral decubitus. -Supportive care with IV fluids and Tylenol.  Active Problems:   Decubitus ulcer of sacral region, stage 3 Appears to be new per daughter. The wound culture. Wound Care nurse consult     HTN (hypertension) Resume home blood pressure medications.   Acute encephalopathy Patient has mild underlying dementia which is worsened with sepsis. Head CT negative. Monitor with neuro checks  Hyperglycemia with Metabolic acidosis/ lactic  acidosis/ DKA Likely triggered due to sepsis and dehydration. Started on insulin drip in ED. continue IV hydration. Check BMET 2 hrs for closure of anion gap. Monitor k and mg. Once anion gap closes will add basal insulin and overlap drip for next 2 hours. Monitor lactic acid level  -hold Januvia     Atrial fibrillation Currently  in sinus rhythm. continue BB and eliquis. Recent echo with EF of  65-70% with moderate stenosis and grade 1 Diatolic dysfn     Iron deficiency Anemia Continue iron supplements    Hyperkalemia Monitor on telemetry. No EKG changes. Follow up with hydration    Acute kidney injury Continue to dehydration. Monitor      UTI (urinary tract infection)  As outlined above. Followup culture. Empiric antibiotics    Eschar of lower leg Reported to be recent per daughter. Wound care consult. Recent rt tibial fracture status post repair. Will check x-ray of bilateral foot.  Protein calorie malnutrition  neutrition consult once taking po      Diet:NPO  DVT prophylaxis: sq lovenox   Code Status:  DNR Family Communication: discussed with daughter on the phone Disposition Plan: admit to stepdown  Eddie North [facility] Pager [phone]  Total time spent on admission :70 minutes  If 7PM-7AM, please contact night-coverage [email or web address] Password [email or web address] 08/12/2014, 12:52 AM

## 2014-08-12 NOTE — ED Notes (Signed)
Pt not responding to verbal stimuli. Oral meds held.

## 2014-08-12 NOTE — Progress Notes (Signed)
Patient Demographics  Lorraine Tran, is a 80 y.o. female, DOB - August 25, 1922, [id number]  Admit date - 08/11/2014   Admitting Physician Eddie NorthNishant Dhungel, MD  Outpatient Primary MD for the patient is Darrow BussingKOIRALA,DIBAS, MD  LOS - 1   Chief Complaint  Patient presents with  . Altered Mental Status        Subjective:   Lorraine Tran is unresponsive, can't provide any history.  Assessment & Plan    Principal Problem:   Severe sepsis Active Problems:   HTN (hypertension)   Dementia   Dehydration   Atrial fibrillation   Hyperglycemia   Metabolic acidosis   DM hyperosmolarity type II, uncontrolled   Acute encephalopathy   Decubitus ulcer of sacral region, stage 3   Anemia   Hyperkalemia   Acute kidney injury   UTI (urinary tract infection)   Eschar of lower leg   Lactic acidosis  Severe sepsis  Admit to stepdown.  Likely a combination of UTI and infected sacral decubitus  Continue empiric vancomycin and cefepime. Follow blood culture and urine cultures in the ED. Will order wound culture from sacral decubitus.  -Supportive care with IV fluids and Tylenol.  -Wound care consult -Requested surgical consult for possible need of surgical department  Decubitus ulcer of sacral region, stage 3  -Continue with broad-spectrum IV antibiotic coverage -Requested surgery to evaluate for possible need of department -Wound care involved  HTN (hypertension)  Resume home blood pressure medications when patient is able to tolerate by mouth.  Acute encephalopathy  Patient has mild underlying dementia which is worsened with sepsis. Head CT negative. Monitor with neuro checks   Hyperglycemia with Metabolic acidosis/ lactic acidosis/ DKA  Likely triggered due to sepsis and dehydration. Started on insulin drip in ED. which was stopped once hyperglycemia corrected  ,continue IV hydration. Monitor lactic acid level  -hold Januvia , started on insulin sliding scale every 4 hours  Atrial fibrillation  Currently in sinus rhythm. Will hold BB and eliquis as unable to tolerate by mouth intake , and may require surgery for her for  her wound .Recent echo with EF of 65-70% with moderate stenosis and grade 1 Diatolic dysfn   Iron deficiency Anemia  Continue iron supplements when able to tolerate oral intake  Hyperkalemia  Monitor on telemetry. No EKG changes. Follow up with hydration   Acute kidney injury  Continue to dehydration. Monitor   UTI (urinary tract infection)  As outlined above. Followup culture. Empiric antibiotics   Eschar of lower leg  Reported to be recent per daughter. Wound care consult. Recent rt tibial fracture status post repair. Will check x-ray of bilateral foot.   Protein calorie malnutrition  neutrition consult once taking po     Code Status: DO NOT RESUSCITATE  Family Communication: Discussed with daughter over the phone, and discussed with daughter that patient is in a very critical condition  Disposition Plan: Admitted to step down   Procedures  None   Consults   Wound care Surgery   Medications  Scheduled Meds: . apixaban  2.5 mg Oral BID  . ceFEPime (MAXIPIME) IV  1 g Intravenous Q12H  . collagenase   Topical Daily  . ferrous sulfate  325 mg Oral Q breakfast  .  insulin aspart  0-9 Units Subcutaneous 6 times per day  . magnesium hydroxide  5 mL Oral QHS  . metoprolol tartrate  25 mg Oral BID  . mirtazapine  7.5 mg Oral QHS  . sodium chloride  3 mL Intravenous Q12H  . [START ON 08/13/2014] vancomycin  750 mg Intravenous Q24H   Continuous Infusions: . sodium chloride    . sodium chloride 1,000 mL (08/12/14 1528)  . dextrose 5 % and 0.45% NaCl 75 mL/hr at 08/12/14 0544   PRN Meds:.acetaminophen, acetaminophen, dextrose, ondansetron (ZOFRAN) IV, ondansetron  DVT Prophylaxis   SCDs , which has been  stopped as patient may require surgery  Lab Results  Component Value Date   PLT 751* 08/12/2014    Antibiotics    Anti-infectives   Start     Dose/Rate Route Frequency Ordered Stop   08/13/14 0600  vancomycin (VANCOCIN) IVPB 750 mg/150 ml premix     750 mg 150 mL/hr over 60 Minutes Intravenous Every 24 hours 08/12/14 0209     08/12/14 1000  ceFEPIme (MAXIPIME) 1 g in dextrose 5 % 50 mL IVPB     1 g 100 mL/hr over 30 Minutes Intravenous Every 12 hours 08/12/14 0209     08/11/14 2330  vancomycin (VANCOCIN) IVPB 1000 mg/200 mL premix     1,000 mg 200 mL/hr over 60 Minutes Intravenous  Once 08/11/14 2329 08/12/14 0107   08/11/14 2330  ceFEPIme (MAXIPIME) 1 g in dextrose 5 % 50 mL IVPB     1 g 100 mL/hr over 30 Minutes Intravenous  Once 08/11/14 2329 08/12/14 0006          Objective:   Filed Vitals:   08/12/14 1330 08/12/14 1345 08/12/14 1503 08/12/14 1633  BP: 143/39 129/44 141/42   Pulse:  100 95   Temp:    97.1 F (36.2 C)  TempSrc:   Axillary Axillary  Resp: 23 28 23    Height:      Weight:   54.885 kg (121 lb)   SpO2: 96% 98% 97%     Wt Readings from Last 3 Encounters:  08/12/14 54.885 kg (121 lb)  07/08/14 65.046 kg (143 lb 6.4 oz)  07/08/14 65.046 kg (143 lb 6.4 oz)     Intake/Output Summary (Last 24 hours) at 08/12/14 1910 Last data filed at 08/12/14 1800  Gross per 24 hour  Intake    300 ml  Output   1025 ml  Net   -725 ml     Physical Exam  HEENT: no pallor, , dry oral mucosa,  Cardiovascular: S1 & S2 tachycardic no MRG  Chest: CTAB, no wheezes, rales, or rhonchi  Abdominal: Soft. Non-tender, non-distended, bowel sounds are normal,  GU: no CVA tenderness Ext: warm, b/l eschar over the foot without discharge but small area of bleeding. Foul smelling sacral decubitus ( stage 3 ) with foul smell Neurological: Obtundent, minimally responsive to painful stimuli.    Data Review   Micro Results Recent Results (from the past 240 hour(s))  MRSA  PCR SCREENING     Status: None   Collection Time    08/12/14  3:35 PM      Result Value Ref Range Status   MRSA by PCR NEGATIVE  NEGATIVE Final   Comment:            The GeneXpert MRSA Assay (FDA     approved for NASAL specimens     only), is one component of a  comprehensive MRSA colonization     surveillance program. It is not     intended to diagnose MRSA     infection nor to guide or     monitor treatment for     MRSA infections.    Radiology Reports Ct Head Wo Contrast  08/12/2014   CLINICAL DATA:  Altered mental status. Last seen normal on Friday. Nonverbal.  EXAM: CT HEAD WITHOUT CONTRAST  TECHNIQUE: Contiguous axial images were obtained from the base of the skull through the vertex without intravenous contrast.  COMPARISON:  MRI brain 07/04/2014.  CT head 07/04/2014.  FINDINGS: Diffuse cerebral atrophy. Mild ventricular dilatation consistent with central atrophy. Low-attenuation changes throughout the deep white matter consistent with small vessel ischemic changes. Old areas of encephalomalacia in the right posterior parietal and left occipital regions consistent with old infarcts. Old lacune or infarcts in the periventricular white matter, basal ganglia, and filum eye bilaterally. Probable old lacunar infarcts in the mid brain. No significant changes since previous study. No evidence of acute intracranial hemorrhage. No mass effect or midline shift. No abnormal extra-axial fluid collections. Basal cisterns are not effaced. No depressed skull fractures. Visualized paranasal sinuses and mastoid air cells are not opacified. Vascular calcifications.  IMPRESSION: No acute intracranial abnormalities. Chronic atrophy and small vessel ischemic changes. 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   Electronically Signed   By: Burman Nieves M.D.   On: 08/12/2014 01:02   Dg Chest Portable 1 View  08/11/2014   CLINICAL DATA:  80 year old female with altered mental status. atrial fibrillation. Initial  encounter.  EXAM: PORTABLE CHEST - 1 VIEW  COMPARISON:  Portable chest radiograph 07/04/2014.  FINDINGS: Portable AP semi upright view at 2314 hrs. Stable lung volumes. Allowing for portable technique, the lungs are clear. Normal cardiac size and mediastinal contours. Visualized tracheal air column is within normal limits.  IMPRESSION: No acute cardiopulmonary abnormality.   Electronically Signed   By: Augusto Gamble M.D.   On: 08/11/2014 23:23   Dg Foot 2 Views Right  08/12/2014   CLINICAL DATA:  80 year old female with bilateral foot wounds and sepsis. Initial encounter.  EXAM: RIGHT FOOT - 2 VIEW  COMPARISON:  Intraoperative radiographs 07/06/2014.  FINDINGS: Distal aspect of tibia intra medullary rod with distal interlocking hardware identified. No evidence of hardware loosening. Stable cortical defect along the post row lateral tibia. Grossly stable mortise joint alignment.  Calcified atherosclerosis in the ankle and foot. Calcaneus appears intact. No acute fracture or osteolysis identified in the right foot. No subcutaneous gas.  IMPRESSION: No acute osseous abnormality identified in the right foot. Stable visualized distal tibia ORIF hardware.   Electronically Signed   By: Augusto Gamble M.D.   On: 08/12/2014 02:20   Dg Foot Complete Left  08/12/2014   CLINICAL DATA:  Sepsis.  Bilateral foot wounds.  EXAM: LEFT FOOT - COMPLETE 3+ VIEW  COMPARISON:  Left ankle 07/04/2014  FINDINGS: Diffuse bone demineralization. Degenerative changes in the interphalangeal joints and first metatarsal-phalangeal joint. Comminuted fractures of the calcaneus again demonstrated. Interval placement of a intra medullary rod and screw in the distal tibia. No new fracture or focal bone erosion is suggested. Vascular calcifications.  IMPRESSION: No acute bony abnormalities. Recent fracture of calcaneus again demonstrated. Diffuse bone demineralization. No focal bone erosion.   Electronically Signed   By: Burman Nieves M.D.   On:  08/12/2014 02:19    CBC  Recent Labs Lab 08/11/14 2255 08/12/14 0058 08/12/14 0242  WBC 23.7* 22.1* 22.5*  HGB 9.7* 9.1* 9.0*  HCT 29.0* 27.9* 27.8*  PLT 786* 789* 751*  MCV 83.6 84.0 85.5  MCH 28.0 27.4 27.7  MCHC 33.4 32.6 32.4  RDW 14.9 15.1 14.8  LYMPHSABS 1.5  --   --   MONOABS 1.4*  --   --   EOSABS 0.0  --   --   BASOSABS 0.0  --   --     Chemistries   Recent Labs Lab 08/11/14 2255 08/12/14 0058 08/12/14 0242 08/12/14 0730 08/12/14 0900 08/12/14 1126  NA 138 138 140 141 140 139  K 5.6* 5.2 4.8 4.7 4.5 4.8  CL 94* 94* 98 104 103 103  CO2 16* 17* 20 23 24 24   GLUCOSE 318* 389* 335* 145* 111* 99  BUN 47* 46* 47* 44* 44* 42*  CREATININE 0.84 0.77 0.75 0.59 0.58 0.55  CALCIUM 8.2* 7.9* 8.1* 7.9* 7.8* 7.9*  AST 14  --   --   --   --   --   ALT 16  --   --   --   --   --   ALKPHOS 118*  --   --   --   --   --   BILITOT 0.7  --   --   --   --   --    ------------------------------------------------------------------------------------------------------------------ estimated creatinine clearance is 40.5 ml/min (by C-G formula based on Cr of 0.55). ------------------------------------------------------------------------------------------------------------------ No results found for this basename: HGBA1C,  in the last 72 hours ------------------------------------------------------------------------------------------------------------------ No results found for this basename: CHOL, HDL, LDLCALC, TRIG, CHOLHDL, LDLDIRECT,  in the last 72 hours ------------------------------------------------------------------------------------------------------------------ No results found for this basename: TSH, T4TOTAL, FREET3, T3FREE, THYROIDAB,  in the last 72 hours ------------------------------------------------------------------------------------------------------------------ No results found for this basename: VITAMINB12, FOLATE, FERRITIN, TIBC, IRON, RETICCTPCT,  in the last  72 hours  Coagulation profile No results found for this basename: INR, PROTIME,  in the last 168 hours  No results found for this basename: DDIMER,  in the last 72 hours  Cardiac Enzymes No results found for this basename: CK, CKMB, TROPONINI, MYOGLOBIN,  in the last 168 hours ------------------------------------------------------------------------------------------------------------------ No components found with this basename: POCBNP,      Time Spent in minutes   35 minutes   Samanyu Tinnell M.D on 08/12/2014 at 7:10 PM  Between 7am to 7pm - Pager - [phone]  After 7pm go to [email or web address] - password [email or web address]  And look for the night coverage person covering for me after hours  [facility] Office  [phone]   **Disclaimer: This note may have been dictated with voice recognition software. Similar sounding words can inadvertently be transcribed and this note may contain transcription errors which may not have been corrected upon publication of note.**

## 2014-08-12 NOTE — ED Notes (Addendum)
Wound ostomy RN at bedside. Turned pt on Left side

## 2014-08-12 NOTE — Consult Note (Signed)
WOC wound consult note Reason for Consult: evaluation of sacral ulcer and multiple pressure ulcers on the LE Wound type:  Sacrum: Unstageable pressure ulcer 9cm x 6cm x 0cm Unstageable: L dorsal foot: 3cm x 1.5cm x 0 Unstageable: L lateral foot: 9cm x 2cm x 0 sDeep tissue injury (SDTI): L medial foot: 4cm x 3cm x 0  Unstagable L heel: 3cm x 3.5cm x 0 Unstagable R dorsal foot: 8cm x 8cm x 0cm Unstagable R dorsal foot proximal: 3cm x 3cm x 0cm Unstagable R heel: 5cm x 3cm x 0 Unstagable R dorsal foot distal: 1.5cm x 1.0cm x 0cm  Pressure Ulcer POA: Yes x 9 Measurement:see above Wound bed:  Sacrum: thin, peeling area of dark brown/yellow slough Most of the areas on the bilateral feet are 100% eschar. She has one area on the left medial foot that is a deep tissue injury- dark, purple tissue not eschar. Drainage (amount, consistency, odor) none on the LE wounds, the sacrum has dark/brown purulent drainage that is most likely related to the amount of necrotic tissue and this wound may be much deeper once the wound has been debrided either with enzymatic or conservative debridement should that be the family wishes.  Periwound: some erythema  noted over the sacrum. Dressing procedure/placement/frequency: Need to discuss the plan of care with the family, ultimately if more aggressive care the LE wound need to be evaluated by vascular.  I will start enzymatic debridement ointment for the sacrum at least to clean this area up until any further decisions can be made on more aggressive care for the sacrum.  I have ordered an air mattress to be delivered to the ED since she is holding for a stepdown bed currently and we are not sure how long that will be. She should not lie on an ED stretcher for any extended periods of time.   She has offloading boots from the SNF currently in place and it is unclear if the pressure ulcers she has currently are related to these boots or any other devices on her feet.  I will  check with her family on the history of these ulcers.   WOC will follow up on this patient once transferred to step down unit  [facility]el[facility] Arantxa Piercey RN,CWOCN [phone]

## 2014-08-12 NOTE — ED Notes (Signed)
Called Dr. Randol KernElgergawy to notify that pt's CBG 89. Ordered to shut off insulin drip, give 10 units lantus, stop D5 0.45 NS drip 30 minutes after receiving insulin.

## 2014-08-12 NOTE — ED Notes (Signed)
Report given to [place], RN on 2 central.

## 2014-08-12 NOTE — ED Notes (Addendum)
Turned pt to right side with pillows, feet elevated in pressure ulcer boots

## 2014-08-12 NOTE — ED Notes (Signed)
Pt rotated to back, heels elevated off of bed.

## 2014-08-12 NOTE — Progress Notes (Signed)
Dr. Randol KernElgergawy was here to see the patient.  MD is informed of patient's sacral decub and that Santyl has been started as per Wound Care recommendation.   Bilateral foot is open to air, no treatment noted at present.  MD stated that patient will be NPO.

## 2014-08-12 NOTE — Progress Notes (Signed)
ANTIBIOTIC CONSULT NOTE - INITIAL  Pharmacy Consult for Vancomycin and Cefepime  Indication: rule out sepsis  Allergies  Allergen Reactions  . Penicillins Other (See Comments)    Unknown reaction    Patient Measurements: Height: 5\' 6"  (167.6 cm) Weight: 120 lb (54.432 kg) IBW/kg (Calculated) : 59.3  Vital Signs: Temp: 99.8 F (37.7 C) (10/19 2218) Temp Source: Rectal (10/19 2218) BP: 131/34 mmHg (10/20 0138) Pulse Rate: 104 (10/20 0138) Intake/Output from previous day: 10/19 0701 - 10/20 0700 In: -  Out: 1025 [Urine:1025] Intake/Output from this shift: Total I/O In: -  Out: 1025 [Urine:1025]  Labs:  Recent Labs  08/11/14 2255 08/12/14 0058  WBC 23.7* 22.1*  HGB 9.7* 9.1*  PLT 786* 789*  CREATININE 0.84 0.77   Estimated Creatinine Clearance: 40.1 ml/min (by C-G formula based on Cr of 0.77). No results found for this basename: VANCOTROUGH, VANCOPEAK, VANCORANDOM, GENTTROUGH, GENTPEAK, GENTRANDOM, TOBRATROUGH, TOBRAPEAK, TOBRARND, AMIKACINPEAK, AMIKACINTROU, AMIKACIN,  in the last 72 hours   Microbiology: No results found for this or any previous visit (from the past 720 hour(s)).  Medical History: Past Medical History  Diagnosis Date  . Diabetes mellitus   . Dementia   . Stroke   . Atrial fibrillation 06/06/2012    Eliquis started 8/13  . Aortic stenosis     a. Echo 6/13:  mild LVH, EF 65-70%, LV vigorous, mod AS, mean 26 mmHg, MAC, mild MS, mean 4 mmHg, mild LAE, PASP 48    Medications:  APAP  Eliquis  Dulcolax  Lexapro  Iron  Lantus  Lopressor  Remeron  Januvia    Assessment: 80 yo female with AMS, possible sepsis, for empiric antibiotics.  Vancomycin 1 g IV given in ED at  midnight  Goal of Therapy:  Vancomycin trough level 15-20 mcg/ml  Plan:  Vancomycin 750 mg IV q24h Cefepime 1 g IV q12h  Makhiya Coburn, Gary FleetGregory Vernon 08/12/2014,2:06 AM

## 2014-08-12 NOTE — ED Notes (Addendum)
Pt responding to pain when feet are moved.

## 2014-08-12 NOTE — ED Notes (Addendum)
Pt turned on left side. Feet elevated with pressure ulcer boots on

## 2014-08-12 NOTE — ED Provider Notes (Signed)
CSN: [id number]     Arrival date & time 08/11/14  2212 History   First MD Initiated Contact with Patient 08/11/14 2223     Chief Complaint  Patient presents with  . Altered Mental Status     (Consider location/radiation/quality/duration/timing/severity/associated sxs/prior Treatment) HPI Comments: Level V caveat as altered  Patient is a 80 y.o. female presenting with altered mental status. The history is provided by the nursing home. The history is limited by the condition of the patient.  Altered Mental Status Presenting symptoms: confusion, lethargy and partial responsiveness   Severity:  Severe Most recent episode:  Today Episode history:  Continuous Timing:  Constant Progression:  Unchanged Chronicity:  New Context: dementia and nursing home resident   Context: not alcohol use and taking medications as prescribed   Context comment:  Patient normally alert oriented x4 with mild dementia. Today started not responding, alert but not speaking. Not her baseline. They got labs at nursing home which showed leukocytosis Associated symptoms: no difficulty breathing, no fever, no rash and no vomiting   Associated symptoms comment:  Has large sacral ulcer   Past Medical History  Diagnosis Date  . Diabetes mellitus   . Dementia   . Stroke   . Atrial fibrillation 06/06/2012    Eliquis started 8/13  . Aortic stenosis     a. Echo 6/13:  mild LVH, EF 65-70%, LV vigorous, mod AS, mean 26 mmHg, MAC, mild MS, mean 4 mmHg, mild LAE, PASP 48   Past Surgical History  Procedure Laterality Date  . Abdominal hysterectomy    . Cholecystectomy    . Tibia im nail insertion Left 07/06/2014    Procedure: INTRAMEDULLARY (IM) NAIL TIBIAL;  Surgeon: Kathryne Hitchhristopher Y Blackman, MD;  Location: [facility] OR;  Service: Orthopedics;  Laterality: Left;  . Tibia im nail insertion Right 07/06/2014    Procedure: INTRAMEDULLARY (IM) NAIL TIBIAL;  Surgeon: Kathryne Hitchhristopher Y Blackman, MD;  Location: [facility] OR;  Service: Orthopedics;   Laterality: Right;   No family history on file. History  Substance Use Topics  . Smoking status: Never Smoker   . Smokeless tobacco: Never Used  . Alcohol Use: No   OB History   Grav Para Term Preterm Abortions TAB SAB Ect Mult Living                 Review of Systems  Unable to perform ROS: Patient nonverbal  Constitutional: Negative for fever.  Gastrointestinal: Negative for vomiting.  Skin: Negative for rash.  Psychiatric/Behavioral: Positive for confusion.      Allergies  Penicillins  Home Medications   Prior to Admission medications   Medication Sig Start Date End Date Taking? Authorizing Provider  acetaminophen (TYLENOL) 325 MG tablet Take 650 mg by mouth every 4 (four) hours as needed for mild pain.   Yes Historical Provider, MD  apixaban (ELIQUIS) 2.5 MG TABS tablet Take 2.5 mg by mouth 2 (two) times daily.   Yes Historical Provider, MD  bisacodyl (DULCOLAX) 10 MG suppository Place 10 mg rectally daily as needed for mild constipation or moderate constipation.   Yes Historical Provider, MD  escitalopram (LEXAPRO) 5 MG tablet Take 5 mg by mouth daily.   Yes Historical Provider, MD  ferrous sulfate 325 (65 FE) MG tablet Take 325 mg by mouth daily with breakfast.   Yes Historical Provider, MD  HYDROcodone-acetaminophen (NORCO/VICODIN) 5-325 MG per tablet Take 1-2 tablets by mouth every 6 (six) hours as needed for moderate pain.   Yes Historical Provider, MD  insulin glargine (LANTUS) 100 UNIT/ML injection Inject 24 Units into the skin daily before supper.    Yes Historical Provider, MD  Magnesium Hydroxide (MILK OF MAGNESIA PO) Take 30 mLs by mouth daily as needed (no BM in 3 days).   Yes Historical Provider, MD  metoprolol tartrate (LOPRESSOR) 25 MG tablet Take 25 mg by mouth 2 (two) times daily.   Yes Historical Provider, MD  mirtazapine (REMERON SOL-TAB) 15 MG disintegrating tablet Take 7.5 mg by mouth at bedtime.   Yes Historical Provider, MD  sitaGLIPtin (JANUVIA)  100 MG tablet Take 100 mg by mouth daily.   Yes Historical Provider, MD  Sodium Phosphates (RA SALINE ENEMA RE) Place 1 Applicatorful rectally daily as needed (constipation).   Yes Historical Provider, MD   BP 151/63  Pulse 115  Temp(Src) 99.8 F (37.7 C) (Rectal)  Resp 21  Ht 5\' 6"  (1.676 m)  Wt 120 lb (54.432 kg)  BMI 19.38 kg/m2  SpO2 100% Physical Exam  Constitutional: She appears well-developed and well-nourished. No distress.  HENT:  Head: Normocephalic and atraumatic.  Mouth/Throat: Oropharynx is clear and moist. No oropharyngeal exudate.  Eyes: Conjunctivae and EOM are normal. Pupils are equal, round, and reactive to light. Right eye exhibits no discharge. Left eye exhibits no discharge. No scleral icterus.  Neck: Normal range of motion. Neck supple.  Cardiovascular: Regular rhythm and normal heart sounds.   No murmur heard. Tachycardic regular  Pulmonary/Chest: Effort normal and breath sounds normal. No respiratory distress. She has no wheezes. She has no rales.  Oxygenating well, course lungs bilaterally Protecting airway at this time  Abdominal: Soft. She exhibits no distension and no mass.  Musculoskeletal:  Large sacral ulcer that is erythematous and appears infected  Neurological: She exhibits normal muscle tone. Coordination normal.  Alert with eyes open, winces to pain does not follow commands  Skin: Skin is warm. No rash noted. She is not diaphoretic.    ED Course  Procedures (including critical care time) Labs Review Labs Reviewed  CBC WITH DIFFERENTIAL - Abnormal; Notable for the following:    WBC 23.7 (*)    RBC 3.47 (*)    Hemoglobin 9.7 (*)    HCT 29.0 (*)    Platelets 786 (*)    Neutrophils Relative % 88 (*)    Neutro Abs 20.8 (*)    Lymphocytes Relative 6 (*)    Monocytes Absolute 1.4 (*)    All other components within normal limits  COMPREHENSIVE METABOLIC PANEL - Abnormal; Notable for the following:    Potassium 5.6 (*)    Chloride 94 (*)     CO2 16 (*)    Glucose, Bld 318 (*)    BUN 47 (*)    Calcium 8.2 (*)    Total Protein 5.5 (*)    Albumin 1.6 (*)    Alkaline Phosphatase 118 (*)    GFR calc non Af Amer 59 (*)    GFR calc Af Amer 69 (*)    Anion gap 28 (*)    All other components within normal limits  URINALYSIS, ROUTINE W REFLEX MICROSCOPIC - Abnormal; Notable for the following:    APPearance CLOUDY (*)    Ketones, ur 15 (*)    Leukocytes, UA MODERATE (*)    All other components within normal limits  URINE MICROSCOPIC-ADD ON - Abnormal; Notable for the following:    Bacteria, UA MANY (*)    All other components within normal limits  I-STAT CG4 LACTIC ACID, ED -  Abnormal; Notable for the following:    Lactic Acid, Venous 2.81 (*)    All other components within normal limits  CULTURE, BLOOD (ROUTINE X 2)  CULTURE, BLOOD (ROUTINE X 2)  URINE CULTURE    Imaging Review Dg Chest Portable 1 View  08/11/2014   CLINICAL DATA:  80 year old female with altered mental status. atrial fibrillation. Initial encounter.  EXAM: PORTABLE CHEST - 1 VIEW  COMPARISON:  Portable chest radiograph 07/04/2014.  FINDINGS: Portable AP semi upright view at 2314 hrs. Stable lung volumes. Allowing for portable technique, the lungs are clear. Normal cardiac size and mediastinal contours. Visualized tracheal air column is within normal limits.  IMPRESSION: No acute cardiopulmonary abnormality.   Electronically Signed   By: Augusto Gamble M.D.   On: 08/11/2014 23:23     EKG Interpretation None      MDM   MDM: 80 year old presents altered mental status. On arrival tachycardic regular, has large sacral ulcer that appears infected. Concern for sepsis has had elevated white count at nursing home today. Level II code sepsis call. Leukocytosis present. Urine infected. Chest x-ray negative. Given fluids with improvement in heart rate. Mental status remains poor. DO NOT RESUSCITATE so intubation not considered and oxygenating well .  Vanco and cefepime  for antibiotic coverage. Will admit to medicine for further care.  Final diagnoses:  Severe sepsis    Admit to Hospitalist  Pilar Jarvis, MD 08/12/14 0030

## 2014-08-12 NOTE — ED Notes (Signed)
Spoke with pt's dtr Annabelle and informed her that pt has moved to 2C15.

## 2014-08-12 NOTE — ED Notes (Signed)
Pt back from CT

## 2014-08-12 NOTE — ED Notes (Signed)
Pt rotated to left side, heels elevated off of bed.

## 2014-08-12 NOTE — ED Notes (Signed)
Still missing insulin from pharmacy. Will call for insulin

## 2014-08-12 NOTE — ED Provider Notes (Signed)
patient with altered mental status. Likely due to infection. At her baseline. Also has large sacral ulcer. Will admit to internal medicine.  i saw and evaluated the patient, reviewed the resident's note and I agree with the findings and plan.   EKG Interpretation   Date/Time:  Monday August 11 2014 22:19:07 EDT Ventricular Rate:  120 PR Interval:  137 QRS Duration: 76 QT Interval:  297 QTC Calculation: 420 R Axis:   60 Text Interpretation:  Sinus tachycardia Atrial premature complex ST  depression, probably rate related Confirmed by Rubin PayorPICKERING  MD, Marlin Brys  [phone]) on 08/12/2014 11:49:36 PM       Juliet RudeNathan R. Rubin PayorPickering, MD 08/12/14 2350

## 2014-08-12 NOTE — Progress Notes (Signed)
CRITICAL VALUE ALERT  Critical value received:  Blood cultures positive, 1 anerobic bottle, 2 aerobic bottle, all positive for gram negative rods  Date of notification:  08/12/2014   Time of notification:  2153  Critical value read back: yes  Nurse who received alert:  Birdena CrandallAmanda Meiko Ives RN  MD notified (1st page): Maren ReamerKaren Kirby  Time of first page:  10:17 PM   MD notified (2nd page):  Time of second page:  Responding MD:    Time MD responded:

## 2014-08-12 NOTE — ED Notes (Signed)
Pt rotated to rt side, heels elevated off of bed.

## 2014-08-12 NOTE — ED Notes (Signed)
Pt taken to CT.

## 2014-08-12 NOTE — ED Notes (Addendum)
Spoke with Dr.Elgergawy regarding oral meds. MD wants to hold all oral meds at this time.

## 2014-08-13 LAB — GLUCOSE, CAPILLARY
GLUCOSE-CAPILLARY: 118 mg/dL — AB (ref 70–99)
GLUCOSE-CAPILLARY: 159 mg/dL — AB (ref 70–99)
GLUCOSE-CAPILLARY: 161 mg/dL — AB (ref 70–99)
Glucose-Capillary: 135 mg/dL — ABNORMAL HIGH (ref 70–99)
Glucose-Capillary: 146 mg/dL — ABNORMAL HIGH (ref 70–99)
Glucose-Capillary: 151 mg/dL — ABNORMAL HIGH (ref 70–99)
Glucose-Capillary: 163 mg/dL — ABNORMAL HIGH (ref 70–99)
Glucose-Capillary: 97 mg/dL (ref 70–99)

## 2014-08-13 MED ORDER — METOPROLOL TARTRATE 1 MG/ML IV SOLN
5.0000 mg | Freq: Four times a day (QID) | INTRAVENOUS | Status: DC
  Administered 2014-08-14 – 2014-08-15 (×6): 5 mg via INTRAVENOUS
  Filled 2014-08-13 (×9): qty 5

## 2014-08-13 MED ORDER — METOPROLOL TARTRATE 1 MG/ML IV SOLN: 5.0000 mg | Freq: Four times a day (QID) | INTRAVENOUS | Status: DC

## 2014-08-13 NOTE — Consult Note (Signed)
Wound care nurse contacted patients daughter to discuss her goals of care for her mother's wounds.  I discussed the severity of the sacral ulcer and that ultimately this would need debridement of some type and may be a much, much larger wound at that point. She was familiar with pulsatile lavage as she reports her daughter had an open wound of the leg that required this and packing in the past.  We discussed her mother LE wounds and at this time they are dry and fairly stable and our goal with her age and current condition would be to keep them this way with pressure redistribution if possible.  She also reported a similar conversation with the SNF "wound care doctor" on these wounds on the feet.   After several minutes of discussion on her mother's current status the patient's daughter expressed the desire to wait 24-48 hours to see how she responds to current therapies and if at that time her status is improving may wish to proceed with hydrotherapy.  I will plan to assess the wound again on Thursday am and discuss with the medical and surgical team at that time.   Azra Abrell [place] RN,CWOCN [phone]

## 2014-08-13 NOTE — Clinical Social Work Psychosocial (Signed)
Clinical Social Work Department BRIEF PSYCHOSOCIAL ASSESSMENT 08/13/2014  Patient:  Lorraine Tran,[facility]raine Tran     Account Number:  [id number]     Admit date:  08/11/2014  Clinical Social Worker:  Lorraine Tran,Lorraine Tran, CLINICAL SOCIAL WORKER  Date/Time:  08/13/2014 03:42 PM  Referred by:  Physician  Date Referred:  08/13/2014 Referred for  SNF Placement   Other Referral:   Interview type:  Family Other interview type:    PSYCHOSOCIAL DATA Living Status:  FACILITY Admitted from facility:  [facility] Level of care:  Skilled Nursing Facility Primary support name:  [facility] Primary support relationship to patient:  CHILD, ADULT Degree of support available:   High level of support from patients daughter who was at bedside    CURRENT CONCERNS Current Concerns  Post-Acute Placement   Other Concerns:    SOCIAL WORK ASSESSMENT / PLAN CSW spoke with patients daughter concerning SNF.  [facility] but daughter is unsure if she would like patient to return.  Patients daughter stated that [facility] not reveal the extent of her mothers pressure ulcers to her and now she is unsure if she trusts them. CSW explained that we could send out patients information again to SNFs and see what was available.  Patients daughter is agreeable to this plan.  CSW will continue to follow.   Assessment/plan status:  Psychosocial Support/Ongoing Assessment of Needs Other assessment/ plan:   FL2 update   Information[facility]referral to [facility] resources[name]    PATIENT'S/FAMILY'S RESPONSE TO PLAN OF CARE: Patients daughter is agreeable to looking into other SNF options and hopes that she can find a facility to better handle her mothers needs.       Lorraine Tran, LCSWA Clinical Social Worker [phone]

## 2014-08-13 NOTE — Progress Notes (Signed)
Patient ID: [facility]raine [facility]an, female   DOB: 05-03-1922, 80 y.o.   MRN: [id number]  No family at bedside Please refer to consult not from Dr. Andrey CampanileWilson Earliest that she could go to the OR is Friday given her anticoagulation medication.  This would also be pending on the family decisions about care.

## 2014-08-13 NOTE — Progress Notes (Signed)
Patient is more awake today.  Dressing to sacral area changed, bilateral LE propped up in a pillow.  No signs of pain.  Patient has been asked if she is in pain but shook her head.

## 2014-08-13 NOTE — Progress Notes (Signed)
[facility] TEAM 1 - Stepdown/ICU TEAM Progress Note  QUINTERIA CHISUM ZOX:[id number] DOB: 09/18/1922 DOA: 08/11/2014 PCP: Darrow Bussing, MD  Admit HPI / Brief Narrative: 80 year old female with history of A. fib on Eliquis, type 2 diabetes mellitus with A1c of 6.7 recently started on insulin, hx of stroke, sacral decubitus, dementia, and recent hospitalization for right tibial fracture status post repair w/ discharged to skilled nursing facility who was sent to the ED with altered mental status noted today.   Upon arrival to the ED she had a low-grade temperature of 99.8 Fahrenheit, tachycardic to 128, tachypnea of 20 with normal blood pressure. O2 sat was maintained.  WBC of 23.7 K., hemoglobin of 9.7, platelets of 786. UA was suggestive of UTI Along with positive ketones. She also was found to have foul-smelling sacral decubitus ulcer.   At baseline per daughter she has mild dementia but seems quite oriented. She saw her 1 day prior to admission and she was at her baseline. She was participating with PT at SNF. She has been eating well per daughter.  HPI/Subjective: Pt will open her eyes and speak to the examiner in short directed answers.  She is mildly confused and somnolent, but is not in extremis.    Assessment/Plan:  Severe sepsis - gram negative rod bacteremia Likely a combination of UTI and infected sacral decubitus  Continue empiric vancomycin and cefepime until speciation available  Wound care consulted Gen Surgery following - daughter presently undecided on surgical I&D  Decubitus ulcer of sacral region, stage 3  Continue with broad-spectrum IV antibiotic coverage  Gen Surgery following and Wound Care involved   Urinary tract infection Followup culture. Empiric antibiotics   Hypertension Resume home blood pressure medications when patient is able to tolerate oral intake - presently too lethargic  Acute toxic metabolic encephalopathy  Patient has mild underlying  dementia which is worsened with sepsis Head CT negative Mental status is slowly improving, but she will be high risk for ICU delirium and sundowning - transfer to med bed asap  Metabolic acidosis / lactic acidosis due to sepsis and dehydration continue IV hydration. Monitor lactic acid level   DM2 Cont SSI - CBG currently reasonably controlled   B foot wounds These are quite concerning in an older pt w/ DM, and high risk for PVD - daughter suggests R foot injury is due to sling used during recent R tibial fx - Wound Care to follow - once stabilizes will need to consider if ABIs of LE would be helpful (though pt may not be good candidate even for PV stents) - plain xrays w/o evidence of osteo at this time   Atrial fibrillation  Currently in sinus rhythm - hold BB and eliquis as unable to tolerate by mouth intake and may require surgery for her for her sacral wound   Iron deficiency Anemia  Continue iron supplements when able to tolerate oral intake   Hyperkalemia  Resolving - follow   Acute kidney injury  Due to dehydration/sepsis - continue to hydrate - renal fxn improving  Protein calorie malnutrition  nutrition consult once taking po   Code Status: FULL Family Communication: spoke w/ daughter at bedside Disposition Plan:   Consultants: Gen Surg  Procedures: none  Antibiotics: Cefepime 10/19 > Vanc 10/19 >  DVT prophylaxis: SCDs  Objective: Blood pressure 148/33, pulse 67, temperature 97 F (36.1 C), temperature source Axillary, resp. rate 22, height 5\' 6"  (1.676 m), weight 54.885 kg (121 lb), SpO2 100.00%.  Intake/Output Summary (  Last 24 hours) at 08/13/14 1539 Last data filed at 08/13/14 1500  Gross per 24 hour  Intake   2353 ml  Output    800 ml  Net   1553 ml   Exam: General: No acute respiratory distress - somnolent  Lungs: Clear to auscultation bilaterally without wheezes or crackles Cardiovascular: Regular rate and rhythm without murmur gallop or  rub  Abdomen: Nontender, nondistended, soft, bowel sounds positive, no rebound, no ascites, no appreciable mass Extremities: No significant cyanosis, clubbing, or edema bilateral lower extremities Cutaneous:  black eschar w/ erythematous borders on the dorsal aspect of the R foot - similar but smaller eschar on dorsal aspect of L foot - no ischemic toes  Data Reviewed: Basic Metabolic Panel:  Recent Labs Lab 08/12/14 0058 08/12/14 0242 08/12/14 0730 08/12/14 0900 08/12/14 1126  NA 138 140 141 140 139  K 5.2 4.8 4.7 4.5 4.8  CL 94* 98 104 103 103  CO2 17* 20 23 24 24   GLUCOSE 389* 335* 145* 111* 99  BUN 46* 47* 44* 44* 42*  CREATININE 0.77 0.75 0.59 0.58 0.55  CALCIUM 7.9* 8.1* 7.9* 7.8* 7.9*    Liver Function Tests:  Recent Labs Lab 08/11/14 2255  AST 14  ALT 16  ALKPHOS 118*  BILITOT 0.7  PROT 5.5*  ALBUMIN 1.6*   Coags: No results found for this basename: PT, INR,  in the last 168 hours No results found for this basename: APTT,  in the last 168 hours  CBC:  Recent Labs Lab 08/11/14 2255 08/12/14 0058 08/12/14 0242  WBC 23.7* 22.1* 22.5*  NEUTROABS 20.8*  --   --   HGB 9.7* 9.1* 9.0*  HCT 29.0* 27.9* 27.8*  MCV 83.6 84.0 85.5  PLT 786* 789* 751*    CBG:  Recent Labs Lab 08/12/14 2100 08/13/14 0005 08/13/14 0258 08/13/14 0835 08/13/14 1256  GLUCAP 158* 151* 97 118* 159*    Recent Results (from the past 240 hour(s))  URINE CULTURE     Status: None   Collection Time    08/11/14 10:46 PM      Result Value Ref Range Status   Specimen Description URINE, CATHETERIZED   Final   Special Requests NONE   Final   Culture  Setup Time     Final   Value: 08/12/2014 04:15     Performed at [facility]   Colony Count PENDING   Incomplete   Culture     Final   Value: Culture reincubated for better growth     Performed at [facility]   Report Status PENDING   Incomplete  CULTURE, BLOOD (ROUTINE X 2)     Status: None   Collection Time      08/11/14 10:55 PM      Result Value Ref Range Status   Specimen Description BLOOD RIGHT ARM   Final   Special Requests BOTTLES DRAWN AEROBIC AND ANAEROBIC 5CC EA   Final   Culture  Setup Time     Final   Value: 08/12/2014 04:21     Performed at [facility]   Culture     Final   Value: GRAM NEGATIVE RODS     Note: Gram Stain Report Called to,Read Back By and Verified With: AMANDA PETTIFORD ON 08/12/2014 AT 9:52P BY WILEJ     Performed at [facility]   Report Status PENDING   Incomplete  CULTURE, BLOOD (ROUTINE X 2)     Status: None  Collection Time    08/11/14 11:05 PM      Result Value Ref Range Status   Specimen Description BLOOD RIGHT HAND   Final   Special Requests BOTTLES DRAWN AEROBIC AND ANAEROBIC 4CC EA   Final   Culture  Setup Time     Final   Value: 08/12/2014 04:21     Performed at [facility]   Culture     Final   Value: GRAM NEGATIVE RODS     Note: Gram Stain Report Called to,Read Back By and Verified With: AMANDA PETTIFORD ON 08/12/2014 AT 9:52P BY WILEJ     Performed at [facility]   Report Status PENDING   Incomplete  WOUND CULTURE     Status: None   Collection Time    08/12/14  8:02 AM      Result Value Ref Range Status   Specimen Description WOUND   Final   Special Requests SACRAL   Final   Gram Stain     Final   Value: NO WBC SEEN     NO SQUAMOUS EPITHELIAL CELLS SEEN     MODERATE GRAM POSITIVE COCCI     IN PAIRS IN CLUSTERS FEW GRAM NEGATIVE RODS     Performed at [facility]   Culture     Final   Value: Culture reincubated for better growth     Performed at [facility]   Report Status PENDING   Incomplete  MRSA PCR SCREENING     Status: None   Collection Time    08/12/14  3:35 PM      Result Value Ref Range Status   MRSA by PCR NEGATIVE  NEGATIVE Final   Comment:            The GeneXpert MRSA Assay (FDA     approved for NASAL specimens     only), is one component of a     comprehensive  MRSA colonization     surveillance program. It is not     intended to diagnose MRSA     infection nor to guide or     monitor treatment for     MRSA infections.    Studies:  Recent x-ray studies have been reviewed in detail by the Attending Physician  Scheduled Meds:  Scheduled Meds: . apixaban  2.5 mg Oral BID  . ceFEPime (MAXIPIME) IV  1 g Intravenous Q12H  . collagenase   Topical Daily  . ferrous sulfate  325 mg Oral Q breakfast  . insulin aspart  0-9 Units Subcutaneous 6 times per day  . magnesium hydroxide  5 mL Oral QHS  . metoprolol tartrate  25 mg Oral BID  . mirtazapine  7.5 mg Oral QHS  . sodium chloride  3 mL Intravenous Q12H  . vancomycin  750 mg Intravenous Q24H    Time spent on care of this patient: 35 mins   Juanito Gonyer T , MD   [facility] Office  [phone] Pager - Text Page per Loretha StaplerAmion as per below:  On-Call/Text Page:      [email or web address]      password [email or web address]  If 7PM-7AM, please contact night-coverage [email or web address] Password [email or web address] 08/13/2014, 3:39 PM   LOS: 2 days

## 2014-08-13 NOTE — Consult Note (Signed)
Reason for Consult:sacral decub Referring Physician: Waldron Labs DAWOOD M.D  Lorraine Tran is an 80 y.o. female.  HPI: 80 yo WF referred by Dr Waldron Labs for evaluation of infected sacral decub. Pt is non-verbal for me but vitals are stable. So history obtained from medical record.   80 year old female with history of A. fib on Eliquis, type 2 diabetes mellitus with A1c of 6.7, recently started on insulin, hx of stroke, sacral decubitus, dementia, recent hospitalization for right tibial fracture status post repair and discharged to skilled nursing facility was sent to the ED with altered mental status noted on 10/20. Patient was noted to be confused and nonverbal, unable to provide any history. Patient reportedly was confused, lethargic and poorly responsive in the nursing home. Upon arrival to the ED she had a low-grade temperature of 99.8 Fahrenheit, tachycardic to 128, tachypnea of the 20 with normal blood pressure. O2 sat was maintained.  Blood work done showed WBC of 23.7 K., hemoglobin of 9.7, platelets of 786.  History short sodium 138, potassium 5.6, chloride 94, CO2 16 with anion gap of 28. BUN of 47 and glucose of 318. UA was suggestive of UTI Along with positive ketones. She also was found to have foul-smelling sacral decubitus ulcer.  Patient was given monitor IV normal saline bolus, 1 dose of IV vancomycin and cefepime for sepsis and hospitalist admission requested to step down unit. Blood cultures and urine cultures sent from the ED.  [facility] Kitchen Chest x-ray and head CT unremarkable.   Past Medical History  Diagnosis Date  . Diabetes mellitus   . Dementia   . Stroke   . Atrial fibrillation 06/06/2012    Eliquis started 8/13  . Aortic stenosis     a. Echo 6/13:  mild LVH, EF 65-70%, LV vigorous, mod AS, mean 26 mmHg, MAC, mild MS, mean 4 mmHg, mild LAE, PASP 48    Past Surgical History  Procedure Laterality Date  . Abdominal hysterectomy    . Cholecystectomy    . Tibia im nail  insertion Left 07/06/2014    Procedure: INTRAMEDULLARY (IM) NAIL TIBIAL;  Surgeon: Mcarthur Rossetti, MD;  Location: [facility];  Service: Orthopedics;  Laterality: Left;  . Tibia im nail insertion Right 07/06/2014    Procedure: INTRAMEDULLARY (IM) NAIL TIBIAL;  Surgeon: Mcarthur Rossetti, MD;  Location: [facility];  Service: Orthopedics;  Laterality: Right;    No family history on file.  Social History:  reports that she has never smoked. She has never used smokeless tobacco. She reports that she does not drink alcohol or use illicit drugs.  Allergies:  Allergies  Allergen Reactions  . Penicillins Other (See Comments)    Unknown reaction    Medications: I have reviewed the patient's current medications.  Results for orders placed during the hospital encounter of 08/11/14 (from the past 48 hour(s))  URINALYSIS, ROUTINE W REFLEX MICROSCOPIC     Status: Abnormal   Collection Time    08/11/14 10:46 PM      Result Value Ref Range   Color, Urine YELLOW  YELLOW   APPearance CLOUDY (*) CLEAR   Specific Gravity, Urine 1.021  1.005 - 1.030   pH 5.0  5.0 - 8.0   Glucose, UA NEGATIVE  NEGATIVE mg/dL   Hgb urine dipstick NEGATIVE  NEGATIVE   Bilirubin Urine NEGATIVE  NEGATIVE   Ketones, ur 15 (*) NEGATIVE mg/dL   Protein, ur NEGATIVE  NEGATIVE mg/dL   Urobilinogen, UA 1.0  0.0 - 1.0 mg/dL  Nitrite NEGATIVE  NEGATIVE   Leukocytes, UA MODERATE (*) NEGATIVE  URINE CULTURE     Status: None   Collection Time    08/11/14 10:46 PM      Result Value Ref Range   Specimen Description URINE, CATHETERIZED     Special Requests NONE     Culture  Setup Time       Value: 08/12/2014 04:15     Performed at [facility] PENDING     Culture       Value: Culture reincubated for better growth     Performed at [facility]   Report Status PENDING    URINE MICROSCOPIC-ADD ON     Status: Abnormal   Collection Time    08/11/14 10:46 PM      Result Value Ref Range    Squamous Epithelial / LPF RARE  RARE   WBC, UA 11-20  <3 WBC/hpf   RBC / HPF 0-2  <3 RBC/hpf   Bacteria, UA MANY (*) RARE  CBC WITH DIFFERENTIAL     Status: Abnormal   Collection Time    08/11/14 10:55 PM      Result Value Ref Range   WBC 23.7 (*) 4.0 - 10.5 K/uL   RBC 3.47 (*) 3.87 - 5.11 MIL/uL   Hemoglobin 9.7 (*) 12.0 - 15.0 g/dL   HCT 29.0 (*) 36.0 - 46.0 %   MCV 83.6  78.0 - 100.0 fL   MCH 28.0  26.0 - 34.0 pg   MCHC 33.4  30.0 - 36.0 g/dL   RDW 14.9  11.5 - 15.5 %   Platelets 786 (*) 150 - 400 K/uL   Neutrophils Relative % 88 (*) 43 - 77 %   Neutro Abs 20.8 (*) 1.7 - 7.7 K/uL   Lymphocytes Relative 6 (*) 12 - 46 %   Lymphs Abs 1.5  0.7 - 4.0 K/uL   Monocytes Relative 6  3 - 12 %   Monocytes Absolute 1.4 (*) 0.1 - 1.0 K/uL   Eosinophils Relative 0  0 - 5 %   Eosinophils Absolute 0.0  0.0 - 0.7 K/uL   Basophils Relative 0  0 - 1 %   Basophils Absolute 0.0  0.0 - 0.1 K/uL  COMPREHENSIVE METABOLIC PANEL     Status: Abnormal   Collection Time    08/11/14 10:55 PM      Result Value Ref Range   Sodium 138  137 - 147 mEq/L   Potassium 5.6 (*) 3.7 - 5.3 mEq/L   Chloride 94 (*) 96 - 112 mEq/L   CO2 16 (*) 19 - 32 mEq/L   Glucose, Bld 318 (*) 70 - 99 mg/dL   BUN 47 (*) 6 - 23 mg/dL   Creatinine, Ser 0.84  0.50 - 1.10 mg/dL   Calcium 8.2 (*) 8.4 - 10.5 mg/dL   Total Protein 5.5 (*) 6.0 - 8.3 g/dL   Albumin 1.6 (*) 3.5 - 5.2 g/dL   AST 14  0 - 37 U/L   ALT 16  0 - 35 U/L   Alkaline Phosphatase 118 (*) 39 - 117 U/L   Total Bilirubin 0.7  0.3 - 1.2 mg/dL   GFR calc non Af Amer 59 (*) >90 mL/min   GFR calc Af Amer 69 (*) >90 mL/min   Comment: (NOTE)     The eGFR has been calculated using the CKD EPI equation.     This calculation has not been validated in  all clinical situations.     eGFR's persistently <90 mL/min signify possible Chronic Kidney     Disease.   Anion gap 28 (*) 5 - 15  CULTURE, BLOOD (ROUTINE X 2)     Status: None   Collection Time    08/11/14 10:55 PM       Result Value Ref Range   Specimen Description BLOOD RIGHT ARM     Special Requests BOTTLES DRAWN AEROBIC AND ANAEROBIC 5CC EA     Culture  Setup Time       Value: 08/12/2014 04:21     Performed at [facility]   Culture       Value: GRAM NEGATIVE RODS     Note: Gram Stain Report Called to,Read Back By and Verified With: AMANDA PETTIFORD ON 08/12/2014 AT 9:52P BY WILEJ     Performed at [facility]   Report Status PENDING    CULTURE, BLOOD (ROUTINE X 2)     Status: None   Collection Time    08/11/14 11:05 PM      Result Value Ref Range   Specimen Description BLOOD RIGHT HAND     Special Requests BOTTLES DRAWN AEROBIC AND ANAEROBIC 4CC EA     Culture  Setup Time       Value: 08/12/2014 04:21     Performed at [facility]   Culture       Value: [place]     Note: Gram Stain Report Called to,Read Back By and Verified With: AMANDA PETTIFORD ON 08/12/2014 AT 9:52P BY WILEJ     Performed at [facility]   Report Status PENDING    I-STAT CG4 LACTIC ACID, ED     Status: Abnormal   Collection Time    08/11/14 11:14 PM      Result Value Ref Range   Lactic Acid, Venous 2.81 (*) 0.5 - 2.2 mmol/L  BASIC METABOLIC PANEL     Status: Abnormal   Collection Time    08/12/14 12:58 AM      Result Value Ref Range   Sodium 138  137 - 147 mEq/L   Potassium 5.2  3.7 - 5.3 mEq/L   Chloride 94 (*) 96 - 112 mEq/L   CO2 17 (*) 19 - 32 mEq/L   Glucose, Bld 389 (*) 70 - 99 mg/dL   BUN 46 (*) 6 - 23 mg/dL   Creatinine, Ser 0.77  0.50 - 1.10 mg/dL   Calcium 7.9 (*) 8.4 - 10.5 mg/dL   GFR calc non Af Amer 72 (*) >90 mL/min   GFR calc Af Amer 83 (*) >90 mL/min   Comment: (NOTE)     The eGFR has been calculated using the CKD EPI equation.     This calculation has not been validated in all clinical situations.     eGFR's persistently <90 mL/min signify possible Chronic Kidney     Disease.   Anion gap 27 (*) 5 - 15  CBC     Status: Abnormal   Collection Time      08/12/14 12:58 AM      Result Value Ref Range   WBC 22.1 (*) 4.0 - 10.5 K/uL   RBC 3.32 (*) 3.87 - 5.11 MIL/uL   Hemoglobin 9.1 (*) 12.0 - 15.0 g/dL   HCT 27.9 (*) 36.0 - 46.0 %   MCV 84.0  78.0 - 100.0 fL   MCH 27.4  26.0 - 34.0 pg   MCHC 32.6  30.0 - 36.0 g/dL   RDW 15.1  11.5 - 15.5 %   Platelets 789 (*) 150 - 400 K/uL  CBG MONITORING, ED     Status: Abnormal   Collection Time    08/12/14  1:14 AM      Result Value Ref Range   Glucose-Capillary 342 (*) 70 - 99 mg/dL  CBG MONITORING, ED     Status: Abnormal   Collection Time    08/12/14  2:19 AM      Result Value Ref Range   Glucose-Capillary 310 (*) 70 - 99 mg/dL  BASIC METABOLIC PANEL     Status: Abnormal   Collection Time    08/12/14  2:42 AM      Result Value Ref Range   Sodium 140  137 - 147 mEq/L   Potassium 4.8  3.7 - 5.3 mEq/L   Chloride 98  96 - 112 mEq/L   CO2 20  19 - 32 mEq/L   Glucose, Bld 335 (*) 70 - 99 mg/dL   BUN 47 (*) 6 - 23 mg/dL   Creatinine, Ser 0.75  0.50 - 1.10 mg/dL   Calcium 8.1 (*) 8.4 - 10.5 mg/dL   GFR calc non Af Amer 72 (*) >90 mL/min   GFR calc Af Amer 84 (*) >90 mL/min   Comment: (NOTE)     The eGFR has been calculated using the CKD EPI equation.     This calculation has not been validated in all clinical situations.     eGFR's persistently <90 mL/min signify possible Chronic Kidney     Disease.   Anion gap 22 (*) 5 - 15  CBC     Status: Abnormal   Collection Time    08/12/14  2:42 AM      Result Value Ref Range   WBC 22.5 (*) 4.0 - 10.5 K/uL   RBC 3.25 (*) 3.87 - 5.11 MIL/uL   Hemoglobin 9.0 (*) 12.0 - 15.0 g/dL   HCT 27.8 (*) 36.0 - 46.0 %   MCV 85.5  78.0 - 100.0 fL   MCH 27.7  26.0 - 34.0 pg   MCHC 32.4  30.0 - 36.0 g/dL   RDW 14.8  11.5 - 15.5 %   Platelets 751 (*) 150 - 400 K/uL  LACTIC ACID, PLASMA     Status: Abnormal   Collection Time    08/12/14  3:10 AM      Result Value Ref Range   Lactic Acid, Venous 4.3 (*) 0.5 - 2.2 mmol/L  CBG MONITORING, ED     Status:  Abnormal   Collection Time    08/12/14  3:28 AM      Result Value Ref Range   Glucose-Capillary 284 (*) 70 - 99 mg/dL  CBG MONITORING, ED     Status: Abnormal   Collection Time    08/12/14  4:29 AM      Result Value Ref Range   Glucose-Capillary 257 (*) 70 - 99 mg/dL  CBG MONITORING, ED     Status: Abnormal   Collection Time    08/12/14  5:37 AM      Result Value Ref Range   Glucose-Capillary 196 (*) 70 - 99 mg/dL  CBG MONITORING, ED     Status: Abnormal   Collection Time    08/12/14  6:56 AM      Result Value Ref Range   Glucose-Capillary 161 (*) 70 - 99 mg/dL  BASIC METABOLIC PANEL     Status: Abnormal  Collection Time    08/12/14  7:30 AM      Result Value Ref Range   Sodium 141  137 - 147 mEq/L   Potassium 4.7  3.7 - 5.3 mEq/L   Chloride 104  96 - 112 mEq/L   CO2 23  19 - 32 mEq/L   Glucose, Bld 145 (*) 70 - 99 mg/dL   BUN 44 (*) 6 - 23 mg/dL   Creatinine, Ser 0.59  0.50 - 1.10 mg/dL   Calcium 7.9 (*) 8.4 - 10.5 mg/dL   GFR calc non Af Amer 78 (*) >90 mL/min   GFR calc Af Amer >90  >90 mL/min   Comment: (NOTE)     The eGFR has been calculated using the CKD EPI equation.     This calculation has not been validated in all clinical situations.     eGFR's persistently <90 mL/min signify possible Chronic Kidney     Disease.   Anion gap 14  5 - 15  CBG MONITORING, ED     Status: Abnormal   Collection Time    08/12/14  7:57 AM      Result Value Ref Range   Glucose-Capillary 132 (*) 70 - 99 mg/dL   Comment 1 Notify RN    BASIC METABOLIC PANEL     Status: Abnormal   Collection Time    08/12/14  9:00 AM      Result Value Ref Range   Sodium 140  137 - 147 mEq/L   Potassium 4.5  3.7 - 5.3 mEq/L   Chloride 103  96 - 112 mEq/L   CO2 24  19 - 32 mEq/L   Glucose, Bld 111 (*) 70 - 99 mg/dL   BUN 44 (*) 6 - 23 mg/dL   Creatinine, Ser 0.58  0.50 - 1.10 mg/dL   Calcium 7.8 (*) 8.4 - 10.5 mg/dL   GFR calc non Af Amer 79 (*) >90 mL/min   GFR calc Af Amer >90  >90 mL/min    Comment: (NOTE)     The eGFR has been calculated using the CKD EPI equation.     This calculation has not been validated in all clinical situations.     eGFR's persistently <90 mL/min signify possible Chronic Kidney     Disease.   Anion gap 13  5 - 15  CBG MONITORING, ED     Status: None   Collection Time    08/12/14 10:30 AM      Result Value Ref Range   Glucose-Capillary 89  70 - 99 mg/dL  BASIC METABOLIC PANEL     Status: Abnormal   Collection Time    08/12/14 11:26 AM      Result Value Ref Range   Sodium 139  137 - 147 mEq/L   Potassium 4.8  3.7 - 5.3 mEq/L   Chloride 103  96 - 112 mEq/L   CO2 24  19 - 32 mEq/L   Glucose, Bld 99  70 - 99 mg/dL   BUN 42 (*) 6 - 23 mg/dL   Creatinine, Ser 0.55  0.50 - 1.10 mg/dL   Calcium 7.9 (*) 8.4 - 10.5 mg/dL   GFR calc non Af Amer 80 (*) >90 mL/min   GFR calc Af Amer >90  >90 mL/min   Comment: (NOTE)     The eGFR has been calculated using the CKD EPI equation.     This calculation has not been validated in all clinical situations.  eGFR's persistently <90 mL/min signify possible Chronic Kidney     Disease.   Anion gap 12  5 - 15  CBG MONITORING, ED     Status: Abnormal   Collection Time    08/12/14 11:50 AM      Result Value Ref Range   Glucose-Capillary 117 (*) 70 - 99 mg/dL  MRSA PCR SCREENING     Status: None   Collection Time    08/12/14  3:35 PM      Result Value Ref Range   MRSA by PCR NEGATIVE  NEGATIVE   Comment:            The GeneXpert MRSA Assay (FDA     approved for NASAL specimens     only), is one component of a     comprehensive MRSA colonization     surveillance program. It is not     intended to diagnose MRSA     infection nor to guide or     monitor treatment for     MRSA infections.  GLUCOSE, CAPILLARY     Status: Abnormal   Collection Time    08/12/14  9:00 PM      Result Value Ref Range   Glucose-Capillary 158 (*) 70 - 99 mg/dL  GLUCOSE, CAPILLARY     Status: Abnormal   Collection Time     08/13/14 12:05 AM      Result Value Ref Range   Glucose-Capillary 151 (*) 70 - 99 mg/dL    Ct Head Wo Contrast  08/12/2014   CLINICAL DATA:  Altered mental status. Last seen normal on Friday. Nonverbal.  EXAM: CT HEAD WITHOUT CONTRAST  TECHNIQUE: Contiguous axial images were obtained from the base of the skull through the vertex without intravenous contrast.  COMPARISON:  MRI brain 07/04/2014.  CT head 07/04/2014.  FINDINGS: Diffuse cerebral atrophy. Mild ventricular dilatation consistent with central atrophy. Low-attenuation changes throughout the deep white matter consistent with small vessel ischemic changes. Old areas of encephalomalacia in the right posterior parietal and left occipital regions consistent with old infarcts. Old lacune or infarcts in the periventricular white matter, basal ganglia, and filum eye bilaterally. Probable old lacunar infarcts in the mid brain. No significant changes since previous study. No evidence of acute intracranial hemorrhage. No mass effect or midline shift. No abnormal extra-axial fluid collections. Basal cisterns are not effaced. No depressed skull fractures. Visualized paranasal sinuses and mastoid air cells are not opacified. Vascular calcifications.  IMPRESSION: No acute intracranial abnormalities. Chronic atrophy and small vessel ischemic changes. 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   Electronically Signed   By: Lucienne Capers M.D.   On: 08/12/2014 01:02   Dg Chest Portable 1 View  08/11/2014   CLINICAL DATA:  80 year old female with altered mental status. atrial fibrillation. Initial encounter.  EXAM: PORTABLE CHEST - 1 VIEW  COMPARISON:  Portable chest radiograph 07/04/2014.  FINDINGS: Portable AP semi upright view at 2314 hrs. Stable lung volumes. Allowing for portable technique, the lungs are clear. Normal cardiac size and mediastinal contours. Visualized tracheal air column is within normal limits.  IMPRESSION: No acute cardiopulmonary abnormality.   Electronically  Signed   By: Lars Pinks M.D.   On: 08/11/2014 23:23   Dg Foot 2 Views Right  08/12/2014   CLINICAL DATA:  80 year old female with bilateral foot wounds and sepsis. Initial encounter.  EXAM: RIGHT FOOT - 2 VIEW  COMPARISON:  Intraoperative radiographs 07/06/2014.  FINDINGS: Distal aspect of tibia intra medullary rod with distal  interlocking hardware identified. No evidence of hardware loosening. Stable cortical defect along the post row lateral tibia. Grossly stable mortise joint alignment.  Calcified atherosclerosis in the ankle and foot. Calcaneus appears intact. No acute fracture or osteolysis identified in the right foot. No subcutaneous gas.  IMPRESSION: No acute osseous abnormality identified in the right foot. Stable visualized distal tibia ORIF hardware.   Electronically Signed   By: Lars Pinks M.D.   On: 08/12/2014 02:20   Dg Foot Complete Left  08/12/2014   CLINICAL DATA:  Sepsis.  Bilateral foot wounds.  EXAM: LEFT FOOT - COMPLETE 3+ VIEW  COMPARISON:  Left ankle 07/04/2014  FINDINGS: Diffuse bone demineralization. Degenerative changes in the interphalangeal joints and first metatarsal-phalangeal joint. Comminuted fractures of the calcaneus again demonstrated. Interval placement of a intra medullary rod and screw in the distal tibia. No new fracture or focal bone erosion is suggested. Vascular calcifications.  IMPRESSION: No acute bony abnormalities. Recent fracture of calcaneus again demonstrated. Diffuse bone demineralization. No focal bone erosion.   Electronically Signed   By: Lucienne Capers M.D.   On: 08/12/2014 02:19    Review of Systems  Unable to perform ROS: dementia   Blood pressure 133/56, pulse 91, temperature 97.4 F (36.3 C), temperature source Axillary, resp. rate 21, height _0  (1.676 m), weight 121 lb (54.885 kg), SpO2 98.00%. Physical Exam  Vitals reviewed. Constitutional: She appears lethargic. She appears cachectic. No distress.  HENT:  Head: Normocephalic and  atraumatic.  Right Ear: External ear normal.  Left Ear: External ear normal.  Eyes: Conjunctivae are normal. No scleral icterus.  Neck: Normal range of motion. Neck supple. No tracheal deviation present.  Cardiovascular: Normal rate and normal heart sounds.   Respiratory: Effort normal and breath sounds normal. No stridor. No respiratory distress. She has no wheezes.  GI: Soft. She exhibits no distension. There is no tenderness. There is no guarding.  Musculoskeletal: She exhibits no edema and no tenderness.  Lymphadenopathy:    She has no cervical adenopathy.  Neurological: She appears lethargic. She exhibits normal muscle tone.  Asleep, doesn't come around, non verbal for me  Skin: Skin is warm and dry. Ecchymosis noted. No rash noted. She is not diaphoretic. No erythema. No pallor.  Multiple bilateral LE (heel and foot) pressure ulcers; large sacral pressure ulcer - see pic, 6 x 9 cm, foul smelling, necrotic skin measure 6 x 9 cm with surrounding cellulitis; some drainage   Psychiatric: She has a normal mood and affect. Her behavior is normal. Judgment and thought content normal.        Assessment/Plan: Principal Problem:   Severe sepsis Active Problems:   HTN (hypertension)   Dementia   Dehydration   Atrial fibrillation   Hyperglycemia   Metabolic acidosis   DM hyperosmolarity type II, uncontrolled   Acute encephalopathy   Decubitus ulcer of sacral region, stage 3   Anemia   Hyperkalemia   Acute kidney injury   UTI (urinary tract infection)   Eschar of lower leg   Lactic acidosis  Pt was on Eloquis until yesterday for Afib which does raise risk of bleeding. Cont IV abx for now. I think she will need debridement of skin and subcu tissue but we will need to discuss it with her daughter during the daytime to see how aggressive she wants [place] to be. For now i would at least recommend bedside wound care by PT/wound care team. Our day team will speak with pt's daughter during  the day today and get a sense of what she would like done. This will probably be a non-healing wound. Hold oral anticoagulant for now  Leighton Ruff. Redmond Pulling, MD, FACS General, Bariatric, & Minimally Invasive Surgery [facility], [place]    [facility] M 08/13/2014, 2:32 AM

## 2014-08-13 NOTE — Progress Notes (Signed)
INITIAL NUTRITION ASSESSMENT  DOCUMENTATION CODES Per approved criteria  -Not Applicable   INTERVENTION: Advance diet as medically appropriate, add supplements accordingly RD to follow for nutrition care plan  NUTRITION DIAGNOSIS: Increased nutrient needs related to wound healing/infection as evidenced by estimated nutrition needs  Goal: Pt to meet >/= 90% of their estimated nutrition needs   Monitor:  PO & supplemental intake, weight, labs, I/O's  Reason for Assessment: Low Braden  80 y.o. female  Admitting Dx: Severe sepsis  ASSESSMENT: 80 year old Female with history of A. fib on Eliquis, type 2 diabetes mellitus with A1c of 6.7, recently started on insulin, hx of stroke, sacral decubitus, dementia, recent hospitalization for right tibial fracture status post repair; sent to the ED with AMS; upon arrival to the ED she had a low-grade temperature of 99.8 Fahrenheit, tachycardic to 128, tachypnea of the 20 with normal blood pressure.   RD unable to obtain nutrition hx.  Pt nonverbal and no family at bedside.  Pt currently NPO.  Nutrient needs increased given wound infection.  Low braden score places patient at risk for further skin breakdown.  RD unable to complete Nutrition Focused Physical Exam at this time.  Height: Ht Readings from Last 1 Encounters:  08/11/14 5\' 6"  (1.676 m)    Weight: Wt Readings from Last 1 Encounters:  08/12/14 121 lb (54.885 kg)    Ideal Body Weight: 130 lb  % Ideal Body Weight: 93%  Wt Readings from Last 10 Encounters:  08/12/14 121 lb (54.885 kg)  07/08/14 143 lb 6.4 oz (65.046 kg)  07/08/14 143 lb 6.4 oz (65.046 kg)  10/01/13 127 lb (57.607 kg)  09/12/12 114 lb (51.71 kg)  07/23/12 119 lb 12.8 oz (54.341 kg)  06/06/12 118 lb 12.8 oz (53.887 kg)  04/24/12 124 lb 1.9 oz (56.3 kg)    Usual Body Weight: 127 lb  % Usual Body Weight: 97%  BMI:  Body mass index is 19.54 kg/(m^2).  Estimated Nutritional Needs: Kcal:  1200-1400 Protein: 70-80 gm Fluid: >/= 1.5 L  Skin:  Unstageable pressure ulcers to: sacrum L dorsal foot L lateral foot L heel R dorsal foot R dorsal foot proximal R heel R dorsal foot distal Suspected deep tissue injury (SDTI) to L medial foot  Diet Order: NPO  EDUCATION NEEDS: -No education needs identified at this time   Intake/Output Summary (Last 24 hours) at 08/13/14 1453 Last data filed at 08/13/14 1409  Gross per 24 hour  Intake   1653 ml  Output    800 ml  Net    853 ml   Labs:   Recent Labs Lab 08/12/14 0730 08/12/14 0900 08/12/14 1126  NA 141 140 139  K 4.7 4.5 4.8  CL 104 103 103  CO2 23 24 24   BUN 44* 44* 42*  CREATININE 0.59 0.58 0.55  CALCIUM 7.9* 7.8* 7.9*  GLUCOSE 145* 111* 99    CBG (last 3)   Recent Labs  08/13/14 0258 08/13/14 0835 08/13/14 1256  GLUCAP 97 118* 159*    Scheduled Meds: . apixaban  2.5 mg Oral BID  . ceFEPime (MAXIPIME) IV  1 g Intravenous Q12H  . collagenase   Topical Daily  . ferrous sulfate  325 mg Oral Q breakfast  . insulin aspart  0-9 Units Subcutaneous 6 times per day  . magnesium hydroxide  5 mL Oral QHS  . metoprolol tartrate  25 mg Oral BID  . mirtazapine  7.5 mg Oral QHS  . sodium chloride  3 mL Intravenous Q12H  . vancomycin  750 mg Intravenous Q24H    Continuous Infusions: . sodium chloride 1,000 mL (08/13/14 1129)    Past Medical History  Diagnosis Date  . Diabetes mellitus   . Dementia   . Stroke   . Atrial fibrillation 06/06/2012    Eliquis started 8/13  . Aortic stenosis     a. Echo 6/13:  mild LVH, EF 65-70%, LV vigorous, mod AS, mean 26 mmHg, MAC, mild MS, mean 4 mmHg, mild LAE, PASP 48    Past Surgical History  Procedure Laterality Date  . Abdominal hysterectomy    . Cholecystectomy    . Tibia im nail insertion Left 07/06/2014    Procedure: INTRAMEDULLARY (IM) NAIL TIBIAL;  Surgeon: Kathryne Hitchhristopher Y Blackman, MD;  Location: [facility] OR;  Service: Orthopedics;  Laterality: Left;  .  Tibia im nail insertion Right 07/06/2014    Procedure: INTRAMEDULLARY (IM) NAIL TIBIAL;  Surgeon: Kathryne Hitchhristopher Y Blackman, MD;  Location: [facility] OR;  Service: Orthopedics;  Laterality: Right;    Maureen ChattersKatie Susana Duell, RD, LDN Pager #: [phone] After-Hours Pager #: [phone]

## 2014-08-13 NOTE — Progress Notes (Signed)
Patient's daughter, Lorraine Tran, is at the bedside.  Admission history is completed through pt's daughter.  She [facility] that her [facility].  Informed her  That she needed to talk to Child psychotherapistocial Worker.  Eileen StanfordJenna, Child psychotherapistocial Worker, was paged and notified her of Ms. Annabelle's request.

## 2014-08-14 ENCOUNTER — Encounter: Payer: Self-pay | Admitting: Internal Medicine

## 2014-08-14 DIAGNOSIS — E118 Type 2 diabetes mellitus with unspecified complications: Secondary | ICD-10-CM

## 2014-08-14 DIAGNOSIS — S91309A Unspecified open wound, unspecified foot, initial encounter: Secondary | ICD-10-CM

## 2014-08-14 DIAGNOSIS — E440 Moderate protein-calorie malnutrition: Secondary | ICD-10-CM

## 2014-08-14 DIAGNOSIS — N179 Acute kidney failure, unspecified: Secondary | ICD-10-CM

## 2014-08-14 DIAGNOSIS — I10 Essential (primary) hypertension: Secondary | ICD-10-CM

## 2014-08-14 DIAGNOSIS — E872 Acidosis: Secondary | ICD-10-CM

## 2014-08-14 DIAGNOSIS — N390 Urinary tract infection, site not specified: Secondary | ICD-10-CM

## 2014-08-14 DIAGNOSIS — L8993 Pressure ulcer of unspecified site, stage 3: Secondary | ICD-10-CM

## 2014-08-14 DIAGNOSIS — G9341 Metabolic encephalopathy: Secondary | ICD-10-CM

## 2014-08-14 DIAGNOSIS — E876 Hypokalemia: Secondary | ICD-10-CM

## 2014-08-14 DIAGNOSIS — I480 Paroxysmal atrial fibrillation: Secondary | ICD-10-CM

## 2014-08-14 LAB — CULTURE, BLOOD (ROUTINE X 2)

## 2014-08-14 LAB — COMPREHENSIVE METABOLIC PANEL
ALBUMIN: 1.4 g/dL — AB (ref 3.5–5.2)
ALBUMIN: 1.6 g/dL — AB (ref 3.5–5.2)
ALT: 12 U/L (ref 0–35)
ALT: 13 U/L (ref 0–35)
ANION GAP: 10 (ref 5–15)
AST: 13 U/L (ref 0–37)
AST: 17 U/L (ref 0–37)
Alkaline Phosphatase: 86 U/L (ref 39–117)
Alkaline Phosphatase: 102 U/L (ref 39–117)
Anion gap: 15 (ref 5–15)
BUN: 10 mg/dL (ref 6–23)
BUN: 8 mg/dL (ref 6–23)
CALCIUM: 7.4 mg/dL — AB (ref 8.4–10.5)
CALCIUM: 7.4 mg/dL — AB (ref 8.4–10.5)
CO2: 27 mEq/L (ref 19–32)
CO2: 22 meq/L (ref 19–32)
CREATININE: 0.25 mg/dL — AB (ref 0.50–1.10)
Chloride: 103 mEq/L (ref 96–112)
Chloride: 98 mEq/L (ref 96–112)
Creatinine, Ser: 0.31 mg/dL — ABNORMAL LOW (ref 0.50–1.10)
GFR calc Af Amer: >90 mL/min (ref >90)
GFR calc Af Amer: >90 mL/min (ref >90)
GFR calc non Af Amer: >90 mL/min (ref >90)
GFR calc non Af Amer: >90 mL/min (ref >90)
Glucose, Bld: 123 mg/dL — ABNORMAL HIGH (ref 70–99)
Glucose, Bld: 181 mg/dL — ABNORMAL HIGH (ref 70–99)
Potassium: 3.1 mEq/L — ABNORMAL LOW (ref 3.7–5.3)
Potassium: 3.6 mEq/L — ABNORMAL LOW (ref 3.7–5.3)
SODIUM: 140 meq/L (ref 137–147)
SODIUM: 135 meq/L — AB (ref 137–147)
TOTAL PROTEIN: 4.8 g/dL — AB (ref 6.0–8.3)
Total Bilirubin: 0.5 mg/dL (ref 0.3–1.2)
Total Bilirubin: 0.4 mg/dL (ref 0.3–1.2)
Total Protein: 5 g/dL — ABNORMAL LOW (ref 6.0–8.3)

## 2014-08-14 LAB — CBC WITH DIFFERENTIAL/PLATELET
BASOS PCT: 0 % (ref 0–1)
Basophils Absolute: 0 10*3/uL (ref 0.0–0.1)
Eosinophils Absolute: 0.2 10*3/uL (ref 0.0–0.7)
Eosinophils Relative: 1 % (ref 0–5)
HCT: 26.9 % — ABNORMAL LOW (ref 36.0–46.0)
Hemoglobin: 8.8 g/dL — ABNORMAL LOW (ref 12.0–15.0)
LYMPHS ABS: 1.7 10*3/uL (ref 0.7–4.0)
LYMPHS PCT: 11 % — AB (ref 12–46)
MCH: 27.2 pg (ref 26.0–34.0)
MCHC: 32.7 g/dL (ref 30.0–36.0)
MCV: 83 fL (ref 78.0–100.0)
MONO ABS: 0.9 10*3/uL (ref 0.1–1.0)
Monocytes Relative: 6 % (ref 3–12)
NEUTROS PCT: 82 % — AB (ref 43–77)
Neutro Abs: 12.1 10*3/uL — ABNORMAL HIGH (ref 1.7–7.7)
Platelets: 638 10*3/uL — ABNORMAL HIGH (ref 150–400)
RBC: 3.24 MIL/uL — AB (ref 3.87–5.11)
RDW: 14.7 % (ref 11.5–15.5)
WBC: 14.8 10*3/uL — AB (ref 4.0–10.5)

## 2014-08-14 LAB — LIPID PANEL
CHOL/HDL RATIO: 7.2 ratio
Cholesterol: 152 mg/dL (ref 0–200)
HDL: 21 mg/dL — AB (ref >39)
LDL CALC: 93 mg/dL (ref 0–99)
TRIGLYCERIDES: 189 mg/dL — AB (ref <150)
VLDL: 38 mg/dL (ref 0–40)

## 2014-08-14 LAB — CBC
HCT: 27.9 % — ABNORMAL LOW (ref 36.0–46.0)
Hemoglobin: 9.1 g/dL — ABNORMAL LOW (ref 12.0–15.0)
MCH: 27.1 pg (ref 26.0–34.0)
MCHC: 32.6 g/dL (ref 30.0–36.0)
MCV: 83 fL (ref 78.0–100.0)
Platelets: 684 10*3/uL — ABNORMAL HIGH (ref 150–400)
RBC: 3.36 MIL/uL — ABNORMAL LOW (ref 3.87–5.11)
RDW: 14.6 % (ref 11.5–15.5)
WBC: 12.1 10*3/uL — ABNORMAL HIGH (ref 4.0–10.5)

## 2014-08-14 LAB — GLUCOSE, CAPILLARY
GLUCOSE-CAPILLARY: 118 mg/dL — AB (ref 70–99)
GLUCOSE-CAPILLARY: 145 mg/dL — AB (ref 70–99)
GLUCOSE-CAPILLARY: 155 mg/dL — AB (ref 70–99)
Glucose-Capillary: 115 mg/dL — ABNORMAL HIGH (ref 70–99)
Glucose-Capillary: 117 mg/dL — ABNORMAL HIGH (ref 70–99)
Glucose-Capillary: 153 mg/dL — ABNORMAL HIGH (ref 70–99)

## 2014-08-14 LAB — WOUND CULTURE: Gram Stain: NONE SEEN

## 2014-08-14 LAB — MAGNESIUM
MAGNESIUM: 2.2 mg/dL (ref 1.5–2.5)
Magnesium: 1.4 mg/dL — ABNORMAL LOW (ref 1.5–2.5)

## 2014-08-14 LAB — PROTIME-INR
INR: 1.17 (ref 0.00–1.49)
PROTHROMBIN TIME: 15 s (ref 11.6–15.2)

## 2014-08-14 LAB — POTASSIUM: Potassium: 3.8 mEq/L (ref 3.7–5.3)

## 2014-08-14 LAB — APTT: aPTT: 38 seconds — ABNORMAL HIGH (ref 24–37)

## 2014-08-14 MED ORDER — DEXTROSE 5 % IV SOLN
1.0000 g | INTRAVENOUS | Status: DC
  Administered 2014-08-14: 1 g via INTRAVENOUS
  Filled 2014-08-14 (×2): qty 10

## 2014-08-14 MED ORDER — MORPHINE SULFATE 2 MG/ML IJ SOLN
1.0000 mg | INTRAMUSCULAR | Status: DC | PRN
  Administered 2014-08-14 – 2014-08-22 (×10): 1 mg via INTRAVENOUS
  Filled 2014-08-14 (×10): qty 1

## 2014-08-14 MED ORDER — SILVER NITRATE-POT NITRATE 75-25 % EX MISC
10.0000 | Freq: Once | CUTANEOUS | Status: DC
  Filled 2014-08-14: qty 10

## 2014-08-14 MED ORDER — POTASSIUM CHLORIDE 10 MEQ/100ML IV SOLN
10.0000 meq | INTRAVENOUS | Status: AC
  Administered 2014-08-14 (×4): 10 meq via INTRAVENOUS
  Filled 2014-08-14: qty 100

## 2014-08-14 MED ORDER — HEPARIN SODIUM (PORCINE) 5000 UNIT/ML IJ SOLN
5000.0000 [IU] | Freq: Three times a day (TID) | INTRAMUSCULAR | Status: DC
  Administered 2014-08-14 – 2014-08-22 (×23): 5000 [IU] via SUBCUTANEOUS
  Filled 2014-08-14 (×28): qty 1

## 2014-08-14 MED ORDER — MAGNESIUM SULFATE 50 % IJ SOLN
3.0000 g | Freq: Once | INTRAVENOUS | Status: AC
  Administered 2014-08-14: 3 g via INTRAVENOUS
  Filled 2014-08-14: qty 6

## 2014-08-14 NOTE — Progress Notes (Signed)
Physical Therapy Wound Treatment Patient Details  Name: Lorraine Tran MRN: [id number] Date of Birth: 1922-05-14  Today's Date: 08/14/2014 Time: 1000-1041 Time Calculation (min): 41 min  Subjective  Patient and Family Stated Goals: pt unable  Pain Score:    Wound Assessment  Pressure Ulcer 08/11/14 Stage II -  Partial thickness loss of dermis presenting as a shallow open ulcer with a red, pink wound bed without slough. wound open, drainage present (Active)  Margins Unattached edges (unapproximated) 08/11/2014 10:28 PM  Drainage Amount Moderate 08/11/2014 10:28 PM  Drainage Description Serosanguineous;Odor 08/11/2014 10:28 PM     Pressure Ulcer 08/11/14 Stage II -  Partial thickness loss of dermis presenting as a shallow open ulcer with a red, pink wound bed without slough. open pressure ulcer to bilat heels (Active)  Dressing Type None 08/12/2014  7:50 PM  State of Healing Eschar 08/13/2014  8:45 PM  Site / Wound Assessment Bleeding;Brown 08/11/2014 10:29 PM  Drainage Amount None 08/12/2014  7:50 PM  Drainage Description Serosanguineous;Odor 08/11/2014 10:29 PM  Treatment Other (Comment) 08/13/2014  2:40 AM     Pressure Ulcer 08/11/14 Stage II -  Partial thickness loss of dermis presenting as a shallow open ulcer with a red, pink wound bed without slough. open pressure ulcer to back of left ankle (Active)  Dressing Type None 08/12/2014  7:50 PM  State of Healing Eschar 08/13/2014  8:45 PM  Site / Wound Assessment Bleeding;Brown 08/11/2014 10:30 PM  Drainage Amount None 08/12/2014  7:50 PM  Drainage Description Serosanguineous 08/11/2014 10:30 PM  Treatment Other (Comment) 08/14/2014  3:50 AM     Pressure Ulcer 08/11/14 Deep Tissue Injury - Purple or maroon localized area of discolored intact skin or blood-filled blister due to damage of underlying soft tissue from pressure and/or shear. pressure ulcers present to tops of bilat feet. (Active)  State of Healing Eschar 08/13/2014   8:45 PM  Site / Wound Assessment Black;Bleeding 08/13/2014  8:45 PM  Drainage Amount Minimal 08/12/2014  7:50 PM  Drainage Description Sanguineous 08/12/2014  7:50 PM  Treatment Other (Comment) 08/14/2014  3:50 AM     Pressure Ulcer 08/12/14 Unstageable - Full thickness tissue loss in which the base of the ulcer is covered by slough (yellow, tan, gray, green or brown) and/or eschar (tan, brown or black) in the wound bed. yellow thick slough (Active)  Dressing Type ABD;Gauze (Comment);Moist to dry 08/14/2014 10:00 AM  Dressing Changed 08/14/2014 10:00 AM  Dressing Change Frequency Daily 08/14/2014 10:00 AM  State of Healing Eschar 08/14/2014 10:00 AM  Site / Wound Assessment Yellow;Black;Pink 08/14/2014 10:00 AM  % Wound base Red or Granulating 0% 08/14/2014 10:00 AM  % Wound base Yellow 100% 08/14/2014 10:00 AM  % Wound base Black 0% 08/14/2014 10:00 AM  % Wound base Other (Comment) 0% 08/14/2014 10:00 AM  Peri-wound Assessment Intact 08/14/2014 10:00 AM  Wound Length (cm) 10.5 cm 08/14/2014 10:00 AM  Wound Width (cm) 10 cm 08/14/2014 10:00 AM  Wound Depth (cm) 3 cm 08/14/2014 10:00 AM  Margins Unattached edges (unapproximated) 08/14/2014 10:00 AM  Drainage Amount Scant 08/14/2014 10:00 AM  Drainage Description Serosanguineous;Other (Comment) 08/14/2014 10:00 AM  Treatment Cleansed;Debridement (Selective);Hydrotherapy (Pulse lavage);Packing (Dry gauze) 08/14/2014 10:00 AM     Incision (Closed) 07/06/14 Leg Left (Active)     Incision (Closed) 07/06/14 Leg Right (Active)   Hydrotherapy Pulsed lavage therapy - wound location: sacrum Pulsed Lavage with Suction (psi): 8 psi Pulsed Lavage with Suction - Normal Saline Used: 1000 mL  Pulsed Lavage Tip: Tip with splash shield Selective Debridement Selective Debridement - Location: sacrum Selective Debridement - Tools Used: Forceps;Scalpel Selective Debridement - Tissue Removed: black, yellow eschar, necrotic tissue   Wound Assessment and  Plan  Wound Therapy - Assess/Plan/Recommendations Wound Therapy - Clinical Statement: pt can benefit from PLS to cleanse, soften tissues to allow for extensive selective debridement to get to healthy tissues. Wound Therapy - Functional Problem List: pt appears to be undergo infrequent mobility and cognitively doesn't have the motivation or awareness to move and get pressure relief. Factors Delaying/Impairing Wound Healing: Immobility;Incontinence Hydrotherapy Plan: Debridement;Dressing change;Patient/family education;Pulsatile lavage with suction Wound Therapy - Frequency: 6X / week Wound Therapy - Current Recommendations: PT Wound Therapy - Follow Up Recommendations: Skilled nursing facility Wound Plan: see above  Wound Therapy Goals- Improve the function of patient's integumentary system by progressing the wound(s) through the phases of wound healing (inflammation - proliferation - remodeling) by: Decrease Necrotic Tissue to: 25% Decrease Necrotic Tissue - Progress: Goal set today Increase Granulation Tissue to: 75% (including any viable CT) Increase Granulation Tissue - Progress: Goal set today Improve Drainage Characteristics: Min;Serous Improve Drainage Characteristics - Progress: Goal set today Goals/treatment plan/discharge plan were made with and agreed upon by patient/family: No, Patient unable to participate in goals/treatment/discharge plan and family unavailable Time For Goal Achievement: 7 days Wound Therapy - Potential for Goals: Good  Goals will be updated until maximal potential achieved or discharge criteria met.  Discharge criteria: when goals achieved, discharge from hospital, MD decision/surgical intervention, no progress towards goals, refusal/missing three consecutive treatments without notification or medical reason.  GP     Sharryn Belding, Tessie Fass 08/14/2014, 11:48 AM 08/14/2014  Donnella Sham, [place] [phone]  (pager)

## 2014-08-14 NOTE — Progress Notes (Signed)
I have seen and examined the patient and agree with the assessment and plans.  Abdurahman Rugg A. Kevork Joyce  MD, FACS  

## 2014-08-14 NOTE — Progress Notes (Signed)
[facility] TEAM 1 - Stepdown/ICU TEAM Progress Note  Donata Claynne M Crites UJW:[id number] DOB: 1922-03-06 DOA: 08/11/2014 PCP: Darrow BussingKOIRALA,DIBAS, MD  Admit HPI / Brief Narrative: 80 year old female with history of A. fib on Eliquis, type 2 diabetes mellitus with A1c of 6.7, recently started on insulin, hx of stroke, sacral decubitus, dementia, recent hospitalization for right tibial fracture status post repair and discharged to skilled nursing facility was sent to the ED with altered mental status noted today. patient is confused and nonverbal, unable to provide any history. Patient reportedly was confused, lethargic and poorly responsive in the nursing home. Upon arrival to the ED she had a low-grade temperature of 99.8 Fahrenheit, tachycardic to 128, tachypnea of the 20 with normal blood pressure. O2 sat was maintained.  Blood work done showed WBC of 23.7 K., hemoglobin of 9.7, platelets of 786.  History short sodium 138, potassium 5.6, chloride 94, CO2 16 with anion gap of 28. BUN of 47 and glucose of 318. UA was suggestive of UTI Along with positive ketones. She also was found to have foul-smelling sacral decubitus ulcer.  Patient was given monitor IV normal saline bolus, 1 dose of IV vancomycin and cefepime for sepsis and hospitalist admission requested to step down unit. Blood cultures and urine cultures sent from the ED.  Marland Kitchen. Chest x-ray and head CT unremarkable.  No history of fever, chills, nausea, vomiting, bowel or urinary symptoms noted.  At baseline per daughter she has mild dementia but seems quite oriented. She saw her 1 day prior to admission and was at her baseline. She was participating with PT at SNF . She has been eating well as well.  As per daughter , patient has new bedsores in the back starting only 3 weeks back. Also has new eschars in b/l foot which daughter thinks is from the cast/ dressing.   HPI/Subjective: 10/22 patient opens eyes spontaneously, answers every question with foot  hurts, does not follow commands.  Assessment/Plan: Severe sepsis - gram negative rod bacteremia  -Likely a combination of UTI and infected sacral decubitus  -DC vancomycin and cefepime, start ceftriaxone  -Wound care/Gen Surgery recommended hydrotherapy for sacral ulcer    Decubitus ulcer of sacral region, stage 3  -Gen Surgery/Wound Care recommended hydrotherapy    Urinary tract infection (Enterobacter Cloacae.)  -Followup culture. Empiric antibiotics  -Start ceftriaxone  Hypertension  -Considering patient's altered mental status and age would allow permissive HTN  -Continue to hold all BP medication  Acute toxic metabolic encephalopathy  -Patient has mild underlying dementia which is worsened with sepsis  -Head CT negative  -Patient waxes and wanes during my examination obtunded, however later RN states patient woke up for her.    Metabolic acidosis / lactic acidosis  - resolved, continue to monitor -Patient still unable to take by mouth nutrition continue normal saline  5775ml/hr  Hypokalemia -Potassium IV 40 mEq x1 -Potassium goal>4  Hypomagnesemia  -Magnesium IV 3 gm Magnesium goal> 2  DM type 2  -Cont sensitive SSI - CBG currently reasonably controlled  -Obtain A1c  Bilateral foot wounds  -These are quite concerning in an older pt w/ DM, and high risk for PVD - daughter suggests R foot injury is due to sling used during recent R tibial fx  - Wound Care to following - once stabilizes will need to consider if ABIs of LE would be helpful (though pt may not be good candidate even for PV stents)  - plain xrays w/o evidence of osteo at this  time   Atrial fibrillation  -Currently in sinus rhythm  - hold BB and eliquis as unable to tolerate PO -On subcutaneous heparin   Iron deficiency Anemia  -Continue iron supplements when able to tolerate oral intake   Acute kidney injury  -Due to dehydration/sepsis - continue to hydrate - renal fxn improving   Protein  calorie malnutrition  -nutrition consult once taking po     Code Status: FULL Family Communication: no family present at time of exam Disposition Plan:?? Will need to meet with daughter SNF vs palliative    Consultants: Dr. Abigail Miyamoto (surgery) RN Melody Eliberto Ivory (wound care)  Procedure/Significant Events: 9/13 echocardiogram Left ventricle:mild concentric hypertrophy. LVEF= 65% to 70%. -(grade 1 diastolic dysfunction). Doppler parameters - Aortic valve: moderate stenosis.  - Pulmonary arteries: PA peak pressure: 60 mm Hg (S).     Culture 10/19 blood right hand/arm positive Enterobacter Cloacae. 10/20 wound organisms present 10/20 MRSA by PCR negative   Antibiotics: Cefepime 10/20>> stopped 10/22 Vancomycin 10/20>> stopped 10/22 Ceftriaxone 10/22>>   DVT prophylaxis: Subcutaneous heparin   Devices NA   LINES / TUBES:      Continuous Infusions: . sodium chloride 75 mL/hr at 08/13/14 2306    Objective: VITAL SIGNS: Temp: 97.4 F (36.3 C) (10/22 0802) Temp Source: Axillary (10/22 0802) BP: 141/44 mmHg (10/22 0802) Pulse Rate: 76 (10/22 0802) SPO2; FIO2:   Intake/Output Summary (Last 24 hours) at 08/14/14 1117 Last data filed at 08/14/14 6962  Gross per 24 hour  Intake 1590.5 ml  Output   1125 ml  Net  465.5 ml     Exam: General: A./O. x0, NAD, follows all commands but repeats foot hurts, No acute respiratory distress Lungs: Clear to auscultation bilaterally without wheezes or crackles Cardiovascular: Regular rate and rhythm without murmur gallop or rub normal S1 and S2 Abdomen: Nontender, nondistended, soft, bowel sounds positive, no rebound, no ascites, no appreciable mass Extremities: Bilateral eschar on the Rt>Lt , bleeding, tender to palpation, negative pus, negative warmth. Bilateral lower extremity blistering   Data Reviewed: Basic Metabolic Panel:  Recent Labs Lab 08/12/14 0242 08/12/14 0730 08/12/14 0900 08/12/14 1126  08/14/14 0308  NA 140 141 140 139 140  K 4.8 4.7 4.5 4.8 3.1*  CL 98 104 103 103 103  CO2 20 23 24 24 27   GLUCOSE 335* 145* 111* 99 123*  BUN 47* 44* 44* 42* 10  CREATININE 0.75 0.59 0.58 0.55 0.31*  CALCIUM 8.1* 7.9* 7.8* 7.9* 7.4*   Liver Function Tests:  Recent Labs Lab 08/11/14 2255 08/14/14 0308  AST 14 13  ALT 16 12  ALKPHOS 118* 86  BILITOT 0.7 0.5  PROT 5.5* 4.8*  ALBUMIN 1.6* 1.4*   No results found for this basename: LIPASE, AMYLASE,  in the last 168 hours No results found for this basename: AMMONIA,  in the last 168 hours CBC:  Recent Labs Lab 08/11/14 2255 08/12/14 0058 08/12/14 0242 08/14/14 0308  WBC 23.7* 22.1* 22.5* 12.1*  NEUTROABS 20.8*  --   --   --   HGB 9.7* 9.1* 9.0* 9.1*  HCT 29.0* 27.9* 27.8* 27.9*  MCV 83.6 84.0 85.5 83.0  PLT 786* 789* 751* 684*   Cardiac Enzymes: No results found for this basename: CKTOTAL, CKMB, CKMBINDEX, TROPONINI,  in the last 168 hours BNP (last 3 results) No results found for this basename: PROBNP,  in the last 8760 hours CBG:  Recent Labs Lab 08/13/14 2101 08/13/14 2133 08/14/14 0054 08/14/14 0331 08/14/14 9528  GLUCAP 135* 146* 117* 118* 145*    Recent Results (from the past 240 hour(s))  URINE CULTURE     Status: None   Collection Time    08/11/14 10:46 PM      Result Value Ref Range Status   Specimen Description URINE, CATHETERIZED   Final   Special Requests NONE   Final   Culture  Setup Time     Final   Value: 08/12/2014 04:15     Performed at [facility]   Colony Count     Final   Value: >=100,000 COLONIES/ML     Performed at [facility]   Culture     Final   Value: ESCHERICHIA COLI     Performed at [facility]   Report Status PENDING   Incomplete  CULTURE, BLOOD (ROUTINE X 2)     Status: None   Collection Time    08/11/14 10:55 PM      Result Value Ref Range Status   Specimen Description BLOOD RIGHT ARM   Final   Special Requests BOTTLES DRAWN AEROBIC AND  ANAEROBIC 5CC EA   Final   Culture  Setup Time     Final   Value: 08/12/2014 04:21     Performed at [facility]   Culture     Final   Value: ENTEROBACTER CLOACAE     Note: Gram Stain Report Called to,Read Back By and Verified With: AMANDA PETTIFORD ON 08/12/2014 AT 9:52P BY WILEJ     Performed at [facility]   Report Status 08/14/2014 FINAL   Final   Organism ID, Bacteria ENTEROBACTER CLOACAE   Final  CULTURE, BLOOD (ROUTINE X 2)     Status: None   Collection Time    08/11/14 11:05 PM      Result Value Ref Range Status   Specimen Description BLOOD RIGHT HAND   Final   Special Requests BOTTLES DRAWN AEROBIC AND ANAEROBIC 4CC EA   Final   Culture  Setup Time     Final   Value: 08/12/2014 04:21     Performed at [facility]   Culture     Final   Value: ENTEROBACTER CLOACAE     Note: SUSCEPTIBILITIES PERFORMED ON PREVIOUS CULTURE WITHIN THE LAST 5 DAYS.     Note: Gram Stain Report Called to,Read Back By and Verified With: AMANDA PETTIFORD ON 08/12/2014 AT 9:52P BY WILEJ     Performed at [facility]   Report Status 08/14/2014 FINAL   Final  WOUND CULTURE     Status: None   Collection Time    08/12/14  8:02 AM      Result Value Ref Range Status   Specimen Description WOUND   Final   Special Requests SACRAL   Final   Gram Stain     Final   Value: NO WBC SEEN     NO SQUAMOUS EPITHELIAL CELLS SEEN     MODERATE GRAM POSITIVE COCCI     IN PAIRS IN CLUSTERS FEW GRAM NEGATIVE RODS     Performed at [facility]   Culture     Final   Value: MULTIPLE ORGANISMS PRESENT, NONE PREDOMINANT     Note: NO STAPHYLOCOCCUS AUREUS ISOLATED NO GROUP A STREP (S.PYOGENES) ISOLATED     Performed at [facility]   Report Status 08/14/2014 FINAL   Final  MRSA PCR SCREENING     Status: None   Collection Time  08/12/14  3:35 PM      Result Value Ref Range Status   MRSA by PCR NEGATIVE  NEGATIVE Final   Comment:            The GeneXpert MRSA Assay (FDA      approved for NASAL specimens     only), is one component of a     comprehensive MRSA colonization     surveillance program. It is not     intended to diagnose MRSA     infection nor to guide or     monitor treatment for     MRSA infections.     Studies:  Recent x-ray studies have been reviewed in detail by the Attending Physician  Scheduled Meds:  Scheduled Meds: . ceFEPime (MAXIPIME) IV  1 g Intravenous Q12H  . collagenase   Topical Daily  . insulin aspart  0-9 Units Subcutaneous 6 times per day  . metoprolol  5 mg Intravenous 4 times per day  . mirtazapine  7.5 mg Oral QHS  . silver nitrate applicators  10 Stick Topical Once  . sodium chloride  3 mL Intravenous Q12H  . vancomycin  750 mg Intravenous Q24H    Time spent on care of this patient: 40 mins   Drema Dallas , MD   [facility] Office  [phone] Pager - [phone]  On-Call/Text Page:      [email or web address]      password [email or web address]  If 7PM-7AM, please contact night-coverage [email or web address] Password [email or web address] 08/14/2014, 11:17 AM   LOS: 3 days

## 2014-08-14 NOTE — Care Management Note (Addendum)
    Page 1 of 1   08/15/2014     2:46:47 PM CARE MANAGEMENT NOTE 08/15/2014  Patient:  Donata ClaySOKOLOWSKI,Arta M   Account Number:  [id number]  Date Initiated:  08/14/2014  Documentation initiated by:  Tamirra Sienkiewicz  Subjective/Objective Assessment:   dx sepsis; resident of [facility]     In-house referral  Clinical Social Worker      DC Planning Services  CM consult      Status of service:  In process, will continue to follow Medicare Important Message given?  YES (If response is "NO", the following Medicare IM given date fields will be blank) Date Medicare IM given:  08/14/2014 Medicare IM given by:  Lavetta Geier Date Additional Medicare IM given:   Additional Medicare IM given by:    Per UR Regulation:  Reviewed for med. necessity/level of care/duration of stay

## 2014-08-14 NOTE — Progress Notes (Signed)
Patient ID: Lorraine Tran, female   DOB: Oct 05, 1922, 80 y.o.   MRN: [id number]    Subjective: Pt unable to really provide any info.  Objective: Vital signs in last 24 hours: Temp:  [97 F (36.1 C)-97.6 F (36.4 C)] 97 F (36.1 C) (10/22 0328) Pulse Rate:  [33-79] 65 (10/22 0328) Resp:  [15-24] 21 (10/22 0328) BP: (138-164)/(30-71) 142/69 mmHg (10/22 0328) SpO2:  [99 %-100 %] 100 % (10/22 0328)    Intake/Output from previous day: 10/21 0701 - 10/22 0700 In: 1990.5 [I.V.:1790.5; IV Piggyback:200] Out: 1125 [Urine:1125] Intake/Output this shift:    PE: Skin: unstageable sacral decubitus ulcer.  100% eschar which is fairly tight.  Lab Results:   Recent Labs  08/12/14 0242 08/14/14 0308  WBC 22.5* 12.1*  HGB 9.0* 9.1*  HCT 27.8* 27.9*  PLT 751* 684*   BMET  Recent Labs  08/12/14 1126 08/14/14 0308  NA 139 140  K 4.8 3.1*  CL 103 103  CO2 24 27  GLUCOSE 99 123*  BUN 42* 10  CREATININE 0.55 0.31*  CALCIUM 7.9* 7.4*   PT/INR  Recent Labs  08/14/14 0308  LABPROT 15.0  INR 1.17   CMP     Component Value Date/Time   NA 140 08/14/2014 0308   K 3.1* 08/14/2014 0308   CL 103 08/14/2014 0308   CO2 27 08/14/2014 0308   GLUCOSE 123* 08/14/2014 0308   BUN 10 08/14/2014 0308   CREATININE 0.31* 08/14/2014 0308   CALCIUM 7.4* 08/14/2014 0308   PROT 4.8* 08/14/2014 0308   ALBUMIN 1.4* 08/14/2014 0308   AST 13 08/14/2014 0308   ALT 12 08/14/2014 0308   ALKPHOS 86 08/14/2014 0308   BILITOT 0.5 08/14/2014 0308   GFRNONAA >90 08/14/2014 0308   GFRAA >90 08/14/2014 0308   Lipase  No results found for this basename: lipase       Studies/Results: No results found.  Anti-infectives: Anti-infectives   Start     Dose/Rate Route Frequency Ordered Stop   08/13/14 0600  vancomycin (VANCOCIN) IVPB 750 mg/150 ml premix     750 mg 150 mL/hr over 60 Minutes Intravenous Every 24 hours 08/12/14 0209     08/12/14 1000  ceFEPIme (MAXIPIME) 1 g in dextrose 5 %  50 mL IVPB     1 g 100 mL/hr over 30 Minutes Intravenous Every 12 hours 08/12/14 0209     08/11/14 2330  vancomycin (VANCOCIN) IVPB 1000 mg/200 mL premix     1,000 mg 200 mL/hr over 60 Minutes Intravenous  Once 08/11/14 2329 08/12/14 0107   08/11/14 2330  ceFEPIme (MAXIPIME) 1 g in dextrose 5 % 50 mL IVPB     1 g 100 mL/hr over 30 Minutes Intravenous  Once 08/11/14 2329 08/12/14 0006       Assessment/Plan  1. unstageable sacral decubitus ulcer 2. Dementia Patient Active Problem List   Diagnosis Date Noted  . Severe sepsis 08/12/2014  . Metabolic acidosis 08/12/2014  . DM hyperosmolarity type II, uncontrolled 08/12/2014  . Acute encephalopathy 08/12/2014  . Decubitus ulcer of sacral region, stage 3 08/12/2014  . Anemia 08/12/2014  . Hyperkalemia 08/12/2014  . Acute kidney injury 08/12/2014  . UTI (urinary tract infection) 08/12/2014  . Eschar of lower leg 08/12/2014  . Lactic acidosis 08/12/2014  . Traumatic closed nondisplaced fracture of distal end of right tibia 07/10/2014  . Heel fracture 07/10/2014  . Acute blood loss anemia 07/10/2014  . Fall 07/04/2014  . Fracture of left  tibia and fibula 07/04/2014  . Encephalopathy 07/04/2014  . Hyperglycemia 07/04/2014  . Atrial fibrillation 06/06/2012  . Symptomatic bradycardia 04/23/2012  . Elevated cholesterol 04/23/2012  . Elevated LDL cholesterol level 04/23/2012  . Aortic stenosis 04/23/2012  . Dementia 04/23/2012  . Leukocytosis 04/23/2012  . Dehydration 04/23/2012  . Lumbar compression fracture 04/23/2012  . Syncope 04/22/2012  . HTN (hypertension) 04/22/2012   Plan: 1. Evaluated the patient with Melody, WOC today.  She has spoken with the daughter who did not want to be very aggressive at this point.  She may be agreeable to hydrotherapy.  I think this would be appropriate for her wound.  We will not plan on debridement of her wound.  WOC will follow her wound and call us back if we are further needed.  Her eliquis  can likely be resumed as we have no plans for debridement.  We will sign off.   LOS: 3 days    Jazell Rosenau E 08/14/2014, 7:59 AM Pager: [phone]

## 2014-08-14 NOTE — Progress Notes (Addendum)
ANTIBIOTIC CONSULT NOTE - INITIAL  Pharmacy Consult for Rocephin Indication: Enterobacter bacteremia  Allergies  Allergen Reactions  . Penicillins Other (See Comments)    Unknown reaction    Patient Measurements: Height: 5\' 6"  (167.6 cm) Weight: 121 lb (54.885 kg) IBW/kg (Calculated) : 59.3  Vital Signs: Temp: 97.7 F (36.5 C) (10/22 1600) Temp Source: Axillary (10/22 1600) BP: 155/93 mmHg (10/22 1600) Pulse Rate: 73 (10/22 1600) Intake/Output from previous day: 10/21 0701 - 10/22 0700 In: 2065.5 [I.V.:1865.5; IV Piggyback:200] Out: 1125 [Urine:1125] Intake/Output from this shift: Total I/O In: 1075 [I.V.:675; IV Piggyback:400] Out: -   Labs:  Recent Labs  08/12/14 0058 08/12/14 0242  08/12/14 0900 08/12/14 1126 08/14/14 0308  WBC 22.1* 22.5*  --   --   --  12.1*  HGB 9.1* 9.0*  --   --   --  9.1*  PLT 789* 751*  --   --   --  684*  CREATININE 0.77 0.75  < > 0.58 0.55 0.31*  < > = values in this interval not displayed. Estimated Creatinine Clearance: 40.5 ml/min (by C-G formula based on Cr of 0.31). No results found for this basename: VANCOTROUGH, Leodis BinetVANCOPEAK, VANCORANDOM, GENTTROUGH, GENTPEAK, GENTRANDOM, TOBRATROUGH, TOBRAPEAK, TOBRARND, AMIKACINPEAK, AMIKACINTROU, AMIKACIN,  in the last 72 hours   Microbiology: Recent Results (from the past 720 hour(s))  URINE CULTURE     Status: None   Collection Time    08/11/14 10:46 PM      Result Value Ref Range Status   Specimen Description URINE, CATHETERIZED   Final   Special Requests NONE   Final   Culture  Setup Time     Final   Value: 08/12/2014 04:15     Performed at [facility]   Colony Count     Final   Value: >=100,000 COLONIES/ML     Performed at [facility]   Culture     Final   Value: ESCHERICHIA COLI     Performed at [facility]   Report Status PENDING   Incomplete  CULTURE, BLOOD (ROUTINE X 2)     Status: None   Collection Time    08/11/14 10:55 PM      Result Value  Ref Range Status   Specimen Description BLOOD RIGHT ARM   Final   Special Requests BOTTLES DRAWN AEROBIC AND ANAEROBIC 5CC EA   Final   Culture  Setup Time     Final   Value: 08/12/2014 04:21     Performed at [facility]   Culture     Final   Value: ENTEROBACTER CLOACAE     Note: Gram Stain Report Called to,Read Back By and Verified With: AMANDA PETTIFORD ON 08/12/2014 AT 9:52P BY WILEJ     Performed at [facility]   Report Status 08/14/2014 FINAL   Final   Organism ID, Bacteria ENTEROBACTER CLOACAE   Final  CULTURE, BLOOD (ROUTINE X 2)     Status: None   Collection Time    08/11/14 11:05 PM      Result Value Ref Range Status   Specimen Description BLOOD RIGHT HAND   Final   Special Requests BOTTLES DRAWN AEROBIC AND ANAEROBIC 4CC EA   Final   Culture  Setup Time     Final   Value: 08/12/2014 04:21     Performed at [facility]   Culture     Final   Value: ENTEROBACTER CLOACAE     Note: SUSCEPTIBILITIES  PERFORMED ON PREVIOUS CULTURE WITHIN THE LAST 5 DAYS.     Note: Gram Stain Report Called to,Read Back By and Verified With: AMANDA PETTIFORD ON 08/12/2014 AT 9:52P BY WILEJ     Performed at [facility]   Report Status 08/14/2014 FINAL   Final  WOUND CULTURE     Status: None   Collection Time    08/12/14  8:02 AM      Result Value Ref Range Status   Specimen Description WOUND   Final   Special Requests SACRAL   Final   Gram Stain     Final   Value: NO WBC SEEN     NO SQUAMOUS EPITHELIAL CELLS SEEN     MODERATE GRAM POSITIVE COCCI     IN PAIRS IN CLUSTERS FEW GRAM NEGATIVE RODS     Performed at [facility]   Culture     Final   Value: MULTIPLE ORGANISMS PRESENT, NONE PREDOMINANT     Note: NO STAPHYLOCOCCUS AUREUS ISOLATED NO GROUP A STREP (S.PYOGENES) ISOLATED     Performed at [facility]   Report Status 08/14/2014 FINAL   Final  MRSA PCR SCREENING     Status: None   Collection Time    08/12/14  3:35 PM      Result Value  Ref Range Status   MRSA by PCR NEGATIVE  NEGATIVE Final   Comment:            The GeneXpert MRSA Assay (FDA     approved for NASAL specimens     only), is one component of a     comprehensive MRSA colonization     surveillance program. It is not     intended to diagnose MRSA     infection nor to guide or     monitor treatment for     MRSA infections.    Medical History: Past Medical History  Diagnosis Date  . Diabetes mellitus   . Dementia   . Stroke   . Atrial fibrillation 06/06/2012    Eliquis started 8/13  . Aortic stenosis     a. Echo 6/13:  mild LVH, EF 65-70%, LV vigorous, mod AS, mean 26 mmHg, MAC, mild MS, mean 4 mmHg, mild LAE, PASP 48   Assessment: 80 year old female with Enterobacter cloacae bacteremia on vancomycin and cefepime now to change antibiotics to ceftriaxone. WBC is 12.1, SCr is 0.31 with estimated CrCl ~ 40 mL/min. Patient is afebrile. Note patient has penicillin allergy but has tolerated cefepime this stay.   Goal of Therapy:  Clinical resolution of infection  Plan:  1. Ceftriaxone 1g IV q24h.  2. Monitor clinical status and response to therapy.   Link SnufferJessica Millen, PharmD, BCPS Clinical Pharmacist [phone] 08/14/2014,5:22 PM   Addendum: Discussed antibiotic coverage for bacteremia and UTI with Dr. Jamelle HaringZemora. Enterobacter cloacae has an increased risk of developing resistance to Ceftriaxone - OK per Dr. Jamelle HaringZemora to change Ceftriaxone to Ciprofloxacin (will cover Ecoli UTI as well). - Crcl 41 ml/min  Plan: 1. Discontinue Ceftriaxone 2. Ciprofloxacin 500mg  IV q12h 3. Monitor renal function, clinical progress and adjust as indicated  Wilfred LacyWesley Hailley Byers, PharmD Clinical Pharmacist [phone] 08/15/2014, 12:52 PM

## 2014-08-14 NOTE — Consult Note (Signed)
WOC wound follow up Met with surgery at the bedside to assess the sacral wound today.  We agree that hydrotherapy at this time would be beneficial to clean up the necrotic tissue present along with continuation of the enzymatic debridement ointment.  I have contacted the patient's daughter with an update on the wound status and she is in agreement with addition of hydrotherapy.   Wound type: Sacral Unstageable Pressure Ulcer  Measurement: see notes from my previous assessment this week Wound bed: 100% yellow/gray/black slough soft covering the wound bed, fairly adherent at this time but has loosened a bit with the debridement ointment.  Drainage (amount, consistency, odor) moderate, serosanguinous  Periwound: intact, mild erythema  Dressing procedure/placement/frequency: I have discussed the addition of hydrotherapy with the daughter she is in agreement with this plan and is aware that this wound may be bigger after this treatment once the necrotic tissue has been cleared away.  Continue the use of betadine to keep the areas on the feet dry and stable, however they is some oozing today and I have added dry dressings over the eschar to protect from injury.  The bedside nurse reports the back of the legs are sensitive to the offloading boots she came in with, may need to add Prevalon boots to offload the areas and see if they are not as much of an irritation to the skin .  Contacted PT department about the need for hydrotherapy to start today.   [place] team will follow along with you for weekly wound assessments.  Please notify me of any acute changes in the wounds or any new areas of concerns Para March RN,CWOCN [phone]

## 2014-08-15 LAB — URINE CULTURE: Colony Count: >=100000

## 2014-08-15 LAB — LACTIC ACID, PLASMA: LACTIC ACID, VENOUS: 1.1 mmol/L (ref 0.5–2.2)

## 2014-08-15 LAB — GLUCOSE, CAPILLARY
GLUCOSE-CAPILLARY: 154 mg/dL — AB (ref 70–99)
GLUCOSE-CAPILLARY: 160 mg/dL — AB (ref 70–99)
Glucose-Capillary: 118 mg/dL — ABNORMAL HIGH (ref 70–99)
Glucose-Capillary: 132 mg/dL — ABNORMAL HIGH (ref 70–99)
Glucose-Capillary: 142 mg/dL — ABNORMAL HIGH (ref 70–99)

## 2014-08-15 LAB — HEMOGLOBIN A1C
HEMOGLOBIN A1C: 7.6 % — AB (ref <5.7)
MEAN PLASMA GLUCOSE: 171 mg/dL — AB (ref <117)

## 2014-08-15 MED ORDER — AMLODIPINE BESYLATE 5 MG PO TABS
5.0000 mg | ORAL_TABLET | Freq: Every day | ORAL | Status: DC
  Filled 2014-08-15: qty 1

## 2014-08-15 MED ORDER — METOPROLOL TARTRATE 12.5 MG HALF TABLET
12.5000 mg | ORAL_TABLET | Freq: Two times a day (BID) | ORAL | Status: DC
  Administered 2014-08-15 – 2014-08-22 (×15): 12.5 mg via ORAL
  Filled 2014-08-15 (×16): qty 1

## 2014-08-15 MED ORDER — CIPROFLOXACIN IN D5W 400 MG/200ML IV SOLN
400.0000 mg | Freq: Two times a day (BID) | INTRAVENOUS | Status: DC
  Administered 2014-08-15 – 2014-08-22 (×14): 400 mg via INTRAVENOUS
  Filled 2014-08-15 (×16): qty 200

## 2014-08-15 NOTE — Progress Notes (Signed)
Physical Therapy Wound Treatment Patient Details  Name: FAYNE MCGUFFEE MRN: [id number] Date of Birth: 27-May-1922  Today's Date: 08/15/2014 Time: 1200-1255 Time Calculation (min): 55 min  Subjective  Subjective: Ohhh Patient and Family Stated Goals: pt unable  Pain Score:    Wound Assessment  Pressure Ulcer 08/11/14 Stage II -  Partial thickness loss of dermis presenting as a shallow open ulcer with a red, pink wound bed without slough. wound open, drainage present (Active)  Dressing Clean;Dry;Intact 08/14/2014  8:00 PM  Margins Unattached edges (unapproximated) 08/11/2014 10:28 PM  Drainage Amount Moderate 08/11/2014 10:28 PM  Drainage Description Serosanguineous;Odor 08/11/2014 10:28 PM     Pressure Ulcer 08/11/14 Stage II -  Partial thickness loss of dermis presenting as a shallow open ulcer with a red, pink wound bed without slough. open pressure ulcer to bilat heels (Active)  Dressing Type Other (Comment) 08/14/2014  8:00 PM  State of Healing Eschar 08/13/2014  8:45 PM  Site / Wound Assessment Bleeding;Brown 08/11/2014 10:29 PM  Drainage Amount None 08/12/2014  7:50 PM  Drainage Description Serosanguineous;Odor 08/11/2014 10:29 PM  Treatment Other (Comment) 08/13/2014  2:40 AM     Pressure Ulcer 08/11/14 Stage II -  Partial thickness loss of dermis presenting as a shallow open ulcer with a red, pink wound bed without slough. open pressure ulcer to back of left ankle (Active)  Dressing Type None 08/12/2014  7:50 PM  State of Healing Eschar 08/13/2014  8:45 PM  Site / Wound Assessment Bleeding;Brown 08/11/2014 10:30 PM  Drainage Amount None 08/12/2014  7:50 PM  Drainage Description Serosanguineous 08/11/2014 10:30 PM  Treatment Other (Comment) 08/14/2014  3:50 AM     Pressure Ulcer 08/11/14 Deep Tissue Injury - Purple or maroon localized area of discolored intact skin or blood-filled blister due to damage of underlying soft tissue from pressure and/or shear. pressure ulcers  present to tops of bilat feet. (Active)  State of Healing Eschar 08/13/2014  8:45 PM  Site / Wound Assessment Black;Bleeding 08/13/2014  8:45 PM  Drainage Amount Minimal 08/12/2014  7:50 PM  Drainage Description Sanguineous 08/12/2014  7:50 PM  Treatment Other (Comment) 08/14/2014  3:50 AM     Pressure Ulcer 08/12/14 Unstageable - Full thickness tissue loss in which the base of the ulcer is covered by slough (yellow, tan, gray, green or brown) and/or eschar (tan, brown or black) in the wound bed. yellow thick slough (Active)  Dressing Type ABD;Gauze (Comment);Moist to dry 08/15/2014  1:07 PM  Dressing Intact 08/15/2014  1:07 PM  Dressing Change Frequency Daily 08/15/2014  1:07 PM  State of Healing Eschar 08/15/2014  1:07 PM  Site / Wound Assessment Yellow;Black;Pink;Pale 08/15/2014  1:07 PM  % Wound base Red or Granulating 5% 08/15/2014  1:07 PM  % Wound base Yellow 95% 08/15/2014  1:07 PM  % Wound base Black 0% 08/15/2014  1:07 PM  % Wound base Other (Comment) 0% 08/15/2014  1:07 PM  Peri-wound Assessment Intact 08/15/2014  1:07 PM  Wound Length (cm) 10.5 cm 08/14/2014 10:00 AM  Wound Width (cm) 10 cm 08/14/2014 10:00 AM  Wound Depth (cm) 3 cm 08/14/2014 10:00 AM  Margins Unattached edges (unapproximated) 08/15/2014  1:07 PM  Drainage Amount Scant 08/15/2014  1:07 PM  Drainage Description Serosanguineous;Other (Comment) 08/15/2014  1:07 PM     Incision (Closed) 07/06/14 Leg Left (Active)     Incision (Closed) 07/06/14 Leg Right (Active)   Hydrotherapy Pulsed lavage therapy - wound location: sacrum Pulsed Lavage with Suction (psi):  8 psi Pulsed Lavage with Suction - Normal Saline Used: 1000 mL Pulsed Lavage Tip: Tip with splash shield Selective Debridement Selective Debridement - Location: sacrum Selective Debridement - Tools Used: Forceps;Scalpel Selective Debridement - Tissue Removed: black, yellow eschar, necrotic tissue   Wound Assessment and Plan  Wound Therapy -  Assess/Plan/Recommendations Wound Therapy - Clinical Statement: pt can benefit from PLS to cleanse, soften tissues to allow for extensive selective debridement to get to healthy tissues. Wound Therapy - Functional Problem List: pt appears to be undergo infrequent mobility and cognitively doesn't have the motivation or awareness to move and get pressure relief. Factors Delaying/Impairing Wound Healing: Immobility;Incontinence Hydrotherapy Plan: Debridement;Dressing change;Patient/family education;Pulsatile lavage with suction Wound Therapy - Frequency: 6X / week Wound Therapy - Current Recommendations: PT Wound Therapy - Follow Up Recommendations: Skilled nursing facility Wound Plan: see above  Wound Therapy Goals- Improve the function of patient's integumentary system by progressing the wound(s) through the phases of wound healing (inflammation - proliferation - remodeling) by: Decrease Necrotic Tissue to: 25% Decrease Necrotic Tissue - Progress: Progressing toward goal (due to extensive debridement) Increase Granulation Tissue to: 75% (including any viable CT) Increase Granulation Tissue - Progress: Progressing toward goal Improve Drainage Characteristics: Min;Serous Improve Drainage Characteristics - Progress: Progressing toward goal Goals/treatment plan/discharge plan were made with and agreed upon by patient/family: No, Patient unable to participate in goals/treatment/discharge plan and family unavailable Time For Goal Achievement: 7 days Wound Therapy - Potential for Goals: Good  Goals will be updated until maximal potential achieved or discharge criteria met.  Discharge criteria: when goals achieved, discharge from hospital, MD decision/surgical intervention, no progress towards goals, refusal/missing three consecutive treatments without notification or medical reason.  GP     Blayne Frankie, Tessie Fass 08/15/2014, 1:14 PM 08/15/2014  Donnella Sham, [place] [phone]   (pager)

## 2014-08-15 NOTE — Progress Notes (Signed)
Admission note:  Arrival Method: Patient arrived to unit on the bed with the family and staff accompanying from [facility]. Mental Orientation:  Alert and oriented x 1 Telemetry: Patient is on Telemetry, CCMD notified. Assessment: See doc Flow sheets. Skin: Stage II pressure wound, PT following with hydrotherapy and done q day.  Eschars on bilateral feet open to air.   IV: Peripheral IV Right FA. Pain: Patient is asleep.  Fall Prevention Safety Plan: Unable to talk about fall prevention safety plan as patient is asleep. Admission Screening: Completed.

## 2014-08-15 NOTE — Evaluation (Signed)
Clinical/Bedside Swallow Evaluation Patient Details  Name: Lorraine Tran MRN: [id number] Date of Birth: 1922/07/31  Today's Date: 08/15/2014 Time: 0930-1005 SLP Time Calculation (min): 35 min  Past Medical History:  Past Medical History  Diagnosis Date  . Diabetes mellitus   . Dementia   . Stroke   . Atrial fibrillation 06/06/2012    Eliquis started 8/13  . Aortic stenosis     a. Echo 6/13:  mild LVH, EF 65-70%, LV vigorous, mod AS, mean 26 mmHg, MAC, mild MS, mean 4 mmHg, mild LAE, PASP 48   Past Surgical History:  Past Surgical History  Procedure Laterality Date  . Abdominal hysterectomy    . Cholecystectomy    . Tibia im nail insertion Left 07/06/2014    Procedure: INTRAMEDULLARY (IM) NAIL TIBIAL;  Surgeon: Kathryne Hitchhristopher Y Blackman, MD;  Location: [facility] OR;  Service: Orthopedics;  Laterality: Left;  . Tibia im nail insertion Right 07/06/2014    Procedure: INTRAMEDULLARY (IM) NAIL TIBIAL;  Surgeon: Kathryne Hitchhristopher Y Blackman, MD;  Location: [facility] OR;  Service: Orthopedics;  Laterality: Right;   HPI:  80 year old female admitted 08/11/14 [facility]. PMH significant for AFib, DM, dementia, Bilateral CVA. CXR and CT unremarkable. No prior SLP intervention in system.   Assessment / Plan / Recommendation Clinical Impression  Pt appears to tolerate thin and puree consistencies. She appears quite weak, and exhibits extended oral prep/holding of both consistencies tested, so solids not attempted at this time. Pt is at increased risk of dysphagia and aspiration, due to multiple comorbidities including dementia, previous CVA, and advanced age. Recommend dys 1 (puree) diet with thin liquids, with adherence to posted swallow precautions. ST to follow for diet tolerance and determine if advanced diet is appropriate, as pt strength and endurance improve.    Aspiration Risk  Moderate    Diet Recommendation Dysphagia 1 (Puree);Thin liquid   Liquid Administration via: Straw Medication  Administration: Whole meds with puree Supervision: Staff to assist with self feeding;Full supervision/cueing for compensatory strategies Compensations: Slow rate;Small sips/bites Postural Changes and/or Swallow Maneuvers: Seated upright 90 degrees;Upright 30-60 min after meal    Other  Recommendations Oral Care Recommendations: Oral care BID Other Recommendations: Clarify dietary restrictions   Follow Up Recommendations  24 hour supervision/assistance;Skilled Nursing facility    Frequency and Duration min 1 x/week  1 week   Pertinent Vitals/Pain VSS, pt reported "hurting all over" ; RN notified    SLP Swallow Goals  diet tolerance   Swallow Study Prior Functional Status   Baseline dementia, previous CVA. No known history of dysphagia    General Date of Onset: 08/11/14 HPI: 80 year old female admitted 08/11/14 [facility]. PMH significant for AFib, DM, dementia, Bilateral CVA. CXR and CT unremarkable. No prior SLP intervention in system. Type of Study: Bedside swallow evaluation Previous Swallow Assessment: none Diet Prior to this Study: NPO Temperature Spikes Noted: No Respiratory Status: Room air History of Recent Intubation: No Behavior/Cognition: Alert;Cooperative;Pleasant mood;Confused;Requires cueing;Doesn't follow directions;Decreased sustained attention Oral Cavity - Dentition: Adequate natural dentition Self-Feeding Abilities: Needs assist Patient Positioning: Upright in bed Baseline Vocal Quality: Clear;Low vocal intensity Volitional Cough: Cognitively unable to elicit Volitional Swallow: Unable to elicit    Oral/Motor/Sensory Function Overall Oral Motor/Sensory Function: Appears within functional limits for tasks assessed   Ice Chips Ice chips: Within functional limits Presentation: Spoon   Thin Liquid Thin Liquid: Within functional limits Presentation: Straw    Nectar Thick Nectar Thick Liquid: Not  tested   Honey Thick Honey Thick Liquid: Not  tested   Puree Puree: Within functional limits Presentation: Spoon   Solid   GO   Sonya Pucci B. Murvin NatalBueche, [facility] Regional Medical CenterMSP, CCC-SLP [phone] Solid: Not tested       [name], Darleny Sem Brown 08/15/2014,1:21 PM

## 2014-08-15 NOTE — Progress Notes (Addendum)
[facility] TEAM 1 - Stepdown/ICU TEAM Progress Note  Lorraine Tran XLK:[id number] DOB: [birth year] DOA: 08/11/2014 PCP: Darrow BussingKOIRALA,DIBAS, MD  Admit HPI / Brief Narrative: 80 year old female with history of A. fib on Eliquis, type 2 diabetes mellitus with A1c of 6.7, recently started on insulin, hx of stroke, sacral decubitus, dementia, recent hospitalization for right tibial fracture status post repair and discharged to skilled nursing facility was sent to the ED with altered mental status. Patient is confused and nonverbal, unable to provide any history. Patient reportedly was confused, lethargic and poorly responsive in the nursing home. Upon arrival to the ED she had a low-grade temperature of 99.8 Fahrenheit, tachycardic to 128, tachypnea of the 20 with normal blood pressure. O2 sat was maintained.  Blood work done showed WBC of 23.7 with UA was suggestive of UTI. She also was found to have foul-smelling sacral decubitus ulcer. She was initially treated with IV vancomycin and cefepime for sepsis and hospitalist admission requested to step down unit. Blood cultures and urine cultures sent from the ED.  Chest x-ray and head CT unremarkable.   At baseline per daughter she has mild dementia but seems quite oriented. She saw her 1 day prior to admission and was at her baseline. She was participating with PT at SNF . She has been eating well as well.  As per daughter , patient has new bedsores in the back starting only 3 weeks back. Also has new eschars in b/l foot which daughter thinks is from the cast/ dressing.  Urine cultures drawn on 08/11/2014 grew Enterobacter and Escherichia coli, both organisms being susceptible to ciprofloxacin. On 08/15/2014 ceftriaxone was discontinued as she was started on Cipro 400 mg IV twice a day. Given clinical stability she was transferred out of the step down unit to the floor. Speech pathology evaluated patient on 08/15/2014 recommended dysphasia 1 thin liquid  diet.   HPI/Subjective: On 10/23 patient is awake, alert, having difficulties following commands, however overall appears improved.  Assessment/Plan: Severe sepsis - gram negative rod bacteremia  -Likely a combination of UTI and infected sacral decubitus  -Evidenced by positive blood culture on 08/11/2014 growing Enterobacter -Ceftriaxone was discontinued on 08/15/2014 started on ciprofloxacin 400 mg IV twice a day, urine cultures growing Enterobacter and Escherichia coli, both organisms susceptible to ciprofloxacin.  -Wound care/Gen Surgery recommended hydrotherapy for sacral ulcer    Decubitus ulcer of sacral region, stage 3  -Gen Surgery/Wound Care recommended hydrotherapy    Urinary tract infection (Enterobacter Cloacae.)  -As mentioned above, urine cultures growing Enterobacter and Escherichia coli -Patient changed to ciprofloxacin 400 mg IV twice a day on 08/15/2014  Hypertension  -Systolic blood pressures fluctuate between 140 and 160 -Will restart metoprolol at 12.5 mg by mouth twice a day  Acute toxic metabolic encephalopathy  -Patient has mild underlying dementia which is worsened with sepsis  -Head CT negative  -Patient waxes and wanes during my examination obtunded, however later RN states patient woke up for her.    Metabolic acidosis / lactic acidosis  - resolved, continue to monitor  Hypokalemia -Potassium IV 40 mEq x1 -Potassium goal>4  Hypomagnesemia  -Magnesium IV 3 gm Magnesium goal> 2  DM type 2  -Cont sensitive SSI - CBG currently reasonably controlled   Bilateral foot wounds  -These are quite concerning in an older pt w/ DM, and high risk for PVD - daughter suggests R foot injury is due to sling used during recent R tibial fx  - Wound Care to following -  once stabilizes will need to consider if ABIs of LE would be helpful (though pt may not be good candidate even for PV stents)  - plain xrays w/o evidence of osteo at this time   Atrial  fibrillation  -Currently in sinus rhythm  - Will restart metoprolol at 12.5 mg by mouth twice a day -On subcutaneous heparin   Iron deficiency Anemia  -Continue iron supplements when able to tolerate oral intake   Acute kidney injury  -Due to dehydration/sepsis - continue to hydrate - renal fxn improving   Protein calorie malnutrition  -Starting pure diet    Code Status: FULL Family Communication: I spoke with her daughter on 08/15/2014 gave update.  Disposition Plan: Likely d/c to SNF when medically stable   Consultants: Dr. Abigail Miyamoto (surgery) RN Melody Eliberto Ivory (wound care)  Procedure/Significant Events: 9/13 echocardiogram Left ventricle:mild concentric hypertrophy. LVEF= 65% to 70%. -(grade 1 diastolic dysfunction). Doppler parameters - Aortic valve: moderate stenosis.  - Pulmonary arteries: PA peak pressure: 60 mm Hg (S).     Culture 10/19 blood right hand/arm positive Enterobacter Cloacae. 10/20 wound organisms present 10/20 MRSA by PCR negative   Antibiotics: Cefepime 10/20>> stopped 10/22 Vancomycin 10/20>> stopped 10/22 Ceftriaxone 10/22>> 10/232 Ciprofloxacin 10/23>>   DVT prophylaxis: Subcutaneous heparin   Devices NA   LINES / TUBES:      Continuous Infusions: . sodium chloride 75 mL/hr (08/15/14 0911)    Objective: VITAL SIGNS: Temp: 97.8 F (36.6 C) (10/23 1219) Temp Source: Oral (10/23 1219) BP: 143/37 mmHg (10/23 1219) Pulse Rate: 68 (10/23 1219) SPO2; FIO2:   Intake/Output Summary (Last 24 hours) at 08/15/14 1453 Last data filed at 08/15/14 1220  Gross per 24 hour  Intake   1228 ml  Output    860 ml  Net    368 ml     Exam: General: A./O. x0, NAD, follows all commands but repeats foot hurts, No acute respiratory distress Lungs: Clear to auscultation bilaterally without wheezes or crackles Cardiovascular: Regular rate and rhythm without murmur gallop or rub normal S1 and S2 Abdomen: Nontender, nondistended,  soft, bowel sounds positive, no rebound, no ascites, no appreciable mass Extremities: Bilateral eschar on the Rt>Lt , bleeding, tender to palpation, negative pus, negative warmth. Bilateral lower extremity blistering   Data Reviewed: Basic Metabolic Panel:  Recent Labs Lab 08/12/14 0730 08/12/14 0900 08/12/14 1126 08/14/14 0308 08/14/14 1441 08/14/14 2113  NA 141 140 139 140  --  135*  K 4.7 4.5 4.8 3.1* 3.8 3.6*  CL 104 103 103 103  --  98  CO2 23 24 24 27   --  22  GLUCOSE 145* 111* 99 123*  --  181*  BUN 44* 44* 42* 10  --  8  CREATININE 0.59 0.58 0.55 0.31*  --  0.25*  CALCIUM 7.9* 7.8* 7.9* 7.4*  --  7.4*  MG  --   --   --   --  1.4* 2.2   Liver Function Tests:  Recent Labs Lab 08/11/14 2255 08/14/14 0308 08/14/14 2113  AST 14 13 17   ALT 16 12 13   ALKPHOS 118* 86 102  BILITOT 0.7 0.5 0.4  PROT 5.5* 4.8* 5.0*  ALBUMIN 1.6* 1.4* 1.6*   No results found for this basename: LIPASE, AMYLASE,  in the last 168 hours No results found for this basename: AMMONIA,  in the last 168 hours CBC:  Recent Labs Lab 08/11/14 2255 08/12/14 0058 08/12/14 0242 08/14/14 0308 08/14/14 2113  WBC 23.7* 22.1*  22.5* 12.1* 14.8*  NEUTROABS 20.8*  --   --   --  12.1*  HGB 9.7* 9.1* 9.0* 9.1* 8.8*  HCT 29.0* 27.9* 27.8* 27.9* 26.9*  MCV 83.6 84.0 85.5 83.0 83.0  PLT 786* 789* 751* 684* 638*   Cardiac Enzymes: No results found for this basename: CKTOTAL, CKMB, CKMBINDEX, TROPONINI,  in the last 168 hours BNP (last 3 results) No results found for this basename: PROBNP,  in the last 8760 hours CBG:  Recent Labs Lab 08/14/14 1613 08/14/14 1927 08/15/14 0457 08/15/14 0845 08/15/14 1217  GLUCAP 115* 153* 118* 154* 132*    Recent Results (from the past 240 hour(s))  URINE CULTURE     Status: None   Collection Time    08/11/14 10:46 PM      Result Value Ref Range Status   Specimen Description URINE, CATHETERIZED   Final   Special Requests NONE   Final   Culture  Setup Time      Final   Value: 08/12/2014 04:15     Performed at [facility]   Colony Count     Final   Value: >=100,000 COLONIES/ML     Performed at [facility]   Culture     Final   Value: ESCHERICHIA COLI     Performed at [facility]   Report Status 08/15/2014 FINAL   Final   Organism ID, Bacteria ESCHERICHIA COLI   Final  CULTURE, BLOOD (ROUTINE X 2)     Status: None   Collection Time    08/11/14 10:55 PM      Result Value Ref Range Status   Specimen Description BLOOD RIGHT ARM   Final   Special Requests BOTTLES DRAWN AEROBIC AND ANAEROBIC 5CC EA   Final   Culture  Setup Time     Final   Value: 08/12/2014 04:21     Performed at [facility]   Culture     Final   Value: ENTEROBACTER CLOACAE     Note: Gram Stain Report Called to,Read Back By and Verified With: AMANDA PETTIFORD ON 08/12/2014 AT 9:52P BY WILEJ     Performed at [facility]   Report Status 08/14/2014 FINAL   Final   Organism ID, Bacteria ENTEROBACTER CLOACAE   Final  CULTURE, BLOOD (ROUTINE X 2)     Status: None   Collection Time    08/11/14 11:05 PM      Result Value Ref Range Status   Specimen Description BLOOD RIGHT HAND   Final   Special Requests BOTTLES DRAWN AEROBIC AND ANAEROBIC 4CC EA   Final   Culture  Setup Time     Final   Value: 08/12/2014 04:21     Performed at [facility]   Culture     Final   Value: ENTEROBACTER CLOACAE     Note: SUSCEPTIBILITIES PERFORMED ON PREVIOUS CULTURE WITHIN THE LAST 5 DAYS.     Note: Gram Stain Report Called to,Read Back By and Verified With: AMANDA PETTIFORD ON 08/12/2014 AT 9:52P BY Serafina Mitchell     Performed at [facility]   Report Status 08/14/2014 FINAL   Final  WOUND CULTURE     Status: None   Collection Time    08/12/14  8:02 AM      Result Value Ref Range Status   Specimen Description WOUND   Final   Special Requests SACRAL   Final   Gram Stain     Final  Value: NO WBC SEEN     NO SQUAMOUS EPITHELIAL CELLS SEEN      MODERATE GRAM POSITIVE COCCI     IN PAIRS IN CLUSTERS FEW GRAM NEGATIVE RODS     [facility]   Culture     Final   Value: MULTIPLE ORGANISMS PRESENT, NONE PREDOMINANT     Note: NO STAPHYLOCOCCUS AUREUS ISOLATED NO GROUP A STREP (S.PYOGENES) ISOLATED     [facility] at [facility]   Report Status 08/14/2014 FINAL   Final  MRSA PCR SCREENING     Status: None   Collection Time    08/12/14  3:35 PM      Result Value Ref Range Status   MRSA by PCR NEGATIVE  NEGATIVE Final   Comment:            The GeneXpert MRSA Assay (FDA     approved for NASAL specimens     only), is one component of a     comprehensive MRSA colonization     surveillance program. It is not     intended to diagnose MRSA     infection nor to guide or     monitor treatment for     MRSA infections.     Studies:  Recent x-ray studies have been reviewed in detail by the Attending Physician  Scheduled Meds:  Scheduled Meds: . ciprofloxacin  400 mg Intravenous Q12H  . collagenase   Topical Daily  . heparin subcutaneous  5,000 Units Subcutaneous 3 times per day  . insulin aspart  0-9 Units Subcutaneous 6 times per day  . metoprolol  5 mg Intravenous 4 times per day  . mirtazapine  7.5 mg Oral QHS  . silver nitrate applicators  10 Stick Topical Once  . sodium chloride  3 mL Intravenous Q12H    Time spent on care of this patient: 35 mins   Jeralyn BennettZAMORA, Junious Ragone , MD   [facility] Office  [phone] Pager - [phone]  On-Call/Text Page:      [email or web address]      password [email or web address]  If 7PM-7AM, please contact night-coverage [email or web address] Password [email or web address] 08/15/2014, 2:53 PM   LOS: 4 days

## 2014-08-16 LAB — GLUCOSE, CAPILLARY
GLUCOSE-CAPILLARY: 126 mg/dL — AB (ref 70–99)
GLUCOSE-CAPILLARY: 148 mg/dL — AB (ref 70–99)
GLUCOSE-CAPILLARY: 167 mg/dL — AB (ref 70–99)
Glucose-Capillary: 148 mg/dL — ABNORMAL HIGH (ref 70–99)
Glucose-Capillary: 154 mg/dL — ABNORMAL HIGH (ref 70–99)
Glucose-Capillary: 156 mg/dL — ABNORMAL HIGH (ref 70–99)
Glucose-Capillary: 188 mg/dL — ABNORMAL HIGH (ref 70–99)

## 2014-08-16 LAB — BASIC METABOLIC PANEL
Anion gap: 15 (ref 5–15)
BUN: 5 mg/dL — AB (ref 6–23)
CALCIUM: 7.4 mg/dL — AB (ref 8.4–10.5)
CHLORIDE: 100 meq/L (ref 96–112)
CO2: 22 meq/L (ref 19–32)
Creatinine, Ser: 0.23 mg/dL — ABNORMAL LOW (ref 0.50–1.10)
GFR calc Af Amer: >90 mL/min (ref >90)
GFR calc non Af Amer: >90 mL/min (ref >90)
GLUCOSE: 146 mg/dL — AB (ref 70–99)
Potassium: 3 mEq/L — ABNORMAL LOW (ref 3.7–5.3)
Sodium: 137 mEq/L (ref 137–147)

## 2014-08-16 LAB — CBC
HEMATOCRIT: 25.3 % — AB (ref 36.0–46.0)
HEMOGLOBIN: 8.4 g/dL — AB (ref 12.0–15.0)
MCH: 27.5 pg (ref 26.0–34.0)
MCHC: 33.2 g/dL (ref 30.0–36.0)
MCV: 83 fL (ref 78.0–100.0)
Platelets: 579 10*3/uL — ABNORMAL HIGH (ref 150–400)
RBC: 3.05 MIL/uL — ABNORMAL LOW (ref 3.87–5.11)
RDW: 14.9 % (ref 11.5–15.5)
WBC: 13.6 10*3/uL — ABNORMAL HIGH (ref 4.0–10.5)

## 2014-08-16 MED ORDER — POTASSIUM CHLORIDE CRYS ER 20 MEQ PO TBCR
60.0000 meq | EXTENDED_RELEASE_TABLET | Freq: Once | ORAL | Status: AC
  Administered 2014-08-16: 60 meq via ORAL
  Filled 2014-08-16: qty 3

## 2014-08-16 NOTE — Progress Notes (Signed)
Physical Therapy Wound Treatment Patient Details  Name: Lorraine Tran MRN: [id number] Date of Birth: 02/04/1922  Today's Date: 08/16/2014 Time: 7824-2353 Time Calculation (min): 38 min  Subjective  Subjective: Ohhh Patient and Family Stated Goals: pt unable  Pain Score:    Wound Assessment  Pressure Ulcer 08/11/14 Stage II -  Partial thickness loss of dermis presenting as a shallow open ulcer with a red, pink wound bed without slough. wound open, drainage present (Active)  Dressing Clean;Dry;Intact 08/16/2014  8:00 AM  Margins Unattached edges (unapproximated) 08/11/2014 10:28 PM  Drainage Amount Moderate 08/11/2014 10:28 PM  Drainage Description Serosanguineous;Odor 08/11/2014 10:28 PM     Pressure Ulcer 08/11/14 Stage II -  Partial thickness loss of dermis presenting as a shallow open ulcer with a red, pink wound bed without slough. open pressure ulcer to bilat heels (Active)  Dressing Type None 08/16/2014  8:00 AM  State of Healing Eschar 08/13/2014  8:45 PM  Site / Wound Assessment Bleeding;Brown 08/11/2014 10:29 PM  Drainage Amount None 08/12/2014  7:50 PM  Drainage Description Serosanguineous;Odor 08/11/2014 10:29 PM  Treatment Other (Comment) 08/13/2014  2:40 AM     Pressure Ulcer 08/11/14 Stage II -  Partial thickness loss of dermis presenting as a shallow open ulcer with a red, pink wound bed without slough. open pressure ulcer to back of left ankle (Active)  Dressing Type None 08/16/2014  9:00 AM  State of Healing Eschar 08/13/2014  8:45 PM  Site / Wound Assessment Bleeding;Brown 08/11/2014 10:30 PM  Drainage Amount None 08/12/2014  7:50 PM  Drainage Description Serosanguineous 08/11/2014 10:30 PM  Treatment Other (Comment) 08/14/2014  3:50 AM     Pressure Ulcer 08/11/14 Deep Tissue Injury - Purple or maroon localized area of discolored intact skin or blood-filled blister due to damage of underlying soft tissue from pressure and/or shear. pressure ulcers present to  tops of bilat feet. (Active)  Dressing Type None 08/16/2014  9:00 AM  State of Healing Eschar 08/13/2014  8:45 PM  Site / Wound Assessment Black;Bleeding 08/13/2014  8:45 PM  Drainage Amount Minimal 08/12/2014  7:50 PM  Drainage Description Sanguineous 08/12/2014  7:50 PM  Treatment Other (Comment) 08/14/2014  3:50 AM     Pressure Ulcer 08/12/14 Unstageable - Full thickness tissue loss in which the base of the ulcer is covered by slough (yellow, tan, gray, green or brown) and/or eschar (tan, brown or black) in the wound bed. yellow thick slough (Active)  Dressing Type ABD;Gauze (Comment);Moist to dry 08/16/2014 11:08 AM  Dressing Intact 08/16/2014 11:08 AM  Dressing Change Frequency Daily 08/16/2014 11:08 AM  State of Healing Eschar 08/16/2014 11:08 AM  Site / Wound Assessment Yellow;Black;Pink;Pale 08/16/2014 11:08 AM  % Wound base Red or Granulating 5% 08/16/2014 11:08 AM  % Wound base Yellow 95% 08/16/2014 11:08 AM  % Wound base Black 0% 08/16/2014 11:08 AM  % Wound base Other (Comment) 0% 08/16/2014 11:08 AM  Peri-wound Assessment Intact 08/16/2014 11:08 AM  Wound Length (cm) 10.5 cm 08/14/2014 10:00 AM  Wound Width (cm) 10 cm 08/14/2014 10:00 AM  Wound Depth (cm) 3 cm 08/14/2014 10:00 AM  Margins Unattached edges (unapproximated) 08/16/2014 11:08 AM  Drainage Amount Minimal 08/16/2014 11:08 AM  Drainage Description Serosanguineous;Other (Comment) 08/16/2014 11:08 AM  Treatment Cleansed;Debridement (Selective);Hydrotherapy (Pulse lavage);Packing (Saline gauze) 08/16/2014 11:08 AM     Incision (Closed) 07/06/14 Leg Left (Active)     Incision (Closed) 07/06/14 Leg Right (Active)   Hydrotherapy Pulsed lavage therapy - wound location: sacrum  Pulsed Lavage with Suction (psi): 8 psi Pulsed Lavage with Suction - Normal Saline Used: 1000 mL Pulsed Lavage Tip: Tip with splash shield Selective Debridement Selective Debridement - Location: sacrum Selective Debridement - Tools Used:  Forceps;Scalpel Selective Debridement - Tissue Removed: black, yellow eschar, necrotic tissue   Wound Assessment and Plan  Wound Therapy - Assess/Plan/Recommendations Wound Therapy - Clinical Statement: pt can benefit from PLS to cleanse, soften tissues to allow for extensive selective debridement to get to healthy tissues. Wound Therapy - Functional Problem List: pt appears to be undergo infrequent mobility and cognitively doesn't have the motivation or awareness to move and get pressure relief. Factors Delaying/Impairing Wound Healing: Immobility;Incontinence Hydrotherapy Plan: Debridement;Dressing change;Patient/family education;Pulsatile lavage with suction Wound Therapy - Frequency: 6X / week Wound Therapy - Current Recommendations: PT Wound Therapy - Follow Up Recommendations: Skilled nursing facility Wound Plan: see above  Wound Therapy Goals- Improve the function of patient's integumentary system by progressing the wound(s) through the phases of wound healing (inflammation - proliferation - remodeling) by: Decrease Necrotic Tissue to: 25% Increase Granulation Tissue to: 75% (including any viable CT) Improve Drainage Characteristics: Min;Serous Goals/treatment plan/discharge plan were made with and agreed upon by patient/family: No, Patient unable to participate in goals/treatment/discharge plan and family unavailable Time For Goal Achievement: 7 days Wound Therapy - Potential for Goals: Good  Goals will be updated until maximal potential achieved or discharge criteria met.  Discharge criteria: when goals achieved, discharge from hospital, MD decision/surgical intervention, no progress towards goals, refusal/missing three consecutive treatments without notification or medical reason.  GP     Lorraine Tran, Tessie Fass 08/16/2014, 11:10 AM  08/16/2014  Donnella Sham, PT [phone]  (pager)

## 2014-08-16 NOTE — Progress Notes (Signed)
Speech Language Pathology Treatment:   Dysphagia Patient Details Name: Lorraine Tran MRN: [id number] DOB: [birth year] Today's Date: 08/16/2014 Time: [id number] SLP Time Calculation (min): 10 min  Assessment / Plan / Recommendation Clinical Impression  Per RN, pt tolerating current diet of puree solids and thin liquids without overt s/s aspiration. No po given at this time, however, due to pt lethargy following morphine for wound care.   HPI HPI: 80 year old female admitted 08/11/14 [facility]. PMH significant for AFib, DM, dementia, Bilateral CVA. CXR and CT unremarkable. No prior SLP intervention in system.   Pertinent Vitals Pain Assessment: Faces Faces Pain Scale: No hurt  SLP Plan       Recommendations Diet recommendations: Dysphagia 1 (puree);Thin liquid Liquids provided via: Cup;Straw Medication Administration: Whole meds with puree Supervision: Staff to assist with self feeding;Full supervision/cueing for compensatory strategies Compensations: Slow rate;Small sips/bites Postural Changes and/or Swallow Maneuvers: Seated upright 90 degrees;Upright 30-60 min after meal              Oral Care Recommendations: Oral care BID Follow up Recommendations: 24 hour supervision/assistance;Skilled Nursing facility    GO   Lorraine Tran, [facility] LLCMSP, CCC-SLP [phone]   Lorraine Tran 08/16/2014, 1:06 PM

## 2014-08-16 NOTE — Progress Notes (Addendum)
[place] TEAM 1 Transfer  Lorraine Tran ZOX:[id number] DOB: [birth year] DOA: 08/11/2014 PCP: Darrow BussingKOIRALA,DIBAS, MD  Admit HPI / Brief Narrative: 80 year old female with history of A. fib on Eliquis, type 2 diabetes mellitus with A1c of 6.7, recently started on insulin, hx of stroke, sacral decubitus, dementia, recent hospitalization for right tibial fracture status post repair and discharged to skilled nursing facility was sent to the ED with altered mental status. Patient is confused and nonverbal, unable to provide any history. Patient reportedly was confused, lethargic and poorly responsive in the nursing home. Upon arrival to the ED she had a low-grade temperature of 99.8 Fahrenheit, tachycardic to 128, tachypnea of the 20 with normal blood pressure. O2 sat was maintained.  Blood work done showed WBC of 23.7 with UA was suggestive of UTI. She also was found to have foul-smelling sacral decubitus ulcer. She was initially treated with IV vancomycin and cefepime for sepsis and hospitalist admission requested to step down unit. Blood cultures and urine cultures sent from the ED.  Chest x-ray and head CT unremarkable.   At baseline per daughter she has mild dementia but seems quite oriented. She saw her 1 day prior to admission and was at her baseline. She was participating with PT at SNF . She has been eating well as well.  As per daughter , patient has new bedsores in the back starting only 3 weeks back. Also has new eschars in b/l foot which daughter thinks is from the cast/ dressing.  Urine cultures drawn on 08/11/2014 grew Enterobacter and Escherichia coli, both organisms being susceptible to ciprofloxacin. On 08/15/2014 ceftriaxone was discontinued as she was started on Cipro 400 mg IV twice a day. Given clinical stability she was transferred out of the step down unit to the floor. Speech pathology evaluated patient on 08/15/2014 recommended dysphasia 1 thin liquid diet.   HPI/Subjective: No  complaints, mumbles few words  Assessment/Plan: Severe sepsis - gram negative rod bacteremia  -Likely a combination of UTI and infected sacral decubitus  -Evidenced by positive blood culture on 08/11/2014 growing Enterobacter -Ceftriaxone was discontinued on 08/15/2014 started on ciprofloxacin 400 mg IV twice a day, urine cultures growing Enterobacter and Escherichia coli, both organisms susceptible to ciprofloxacin, will RX for 2 weeks  -Wound care/Gen Surgery recommended hydrotherapy for sacral ulcer    Decubitus ulcer of sacral region, stage 3  -Gen Surgery/Wound Care recommended hydrotherapy    Urinary tract infection (Enterobacter Cloacae.)  -As mentioned above, urine cultures growing Enterobacter and Escherichia coli -Patient changed to ciprofloxacin 400 mg IV twice a day on 08/15/2014  Dementia -stable  Hypertension  -stable now, Po metoprolol  Acute toxic metabolic encephalopathy  -Patient has mild underlying dementia which is worsened with sepsis  -Head CT negative  -stable, likely at baseline, has underlying dementia too   Metabolic acidosis / lactic acidosis  - resolved, continue to monitor  Hypokalemia -replace  Hypomagnesemia  -Magnesium IV 3 gm Magnesium goal> 2  DM type 2  -Cont sensitive SSI - CBG currently reasonably controlled   Bilateral foot wounds  -due to DM and pressure wounds - Wound Care following - no plan for further workup at this time - plain xrays w/o evidence of osteo at this time   Atrial fibrillation  -Currently in sinus rhythm  - restarted metoprolol at 12.5 mg by mouth twice a day - not on Anticoagulation   Iron deficiency Anemia  -Continue iron supplements when able to tolerate oral intake   Acute kidney  injury  -Due to dehydration/sepsis - continue to hydrate - renal fxn improving   Protein calorie malnutrition  -Starting pure diet    Code Status: FULL Family Communication:none at bedside, will attempt to contact  dtr Disposition Plan: Likely d/c to SNF when medically stable   Consultants: Dr. Abigail Miyamotoouglas Blackman (surgery) RN Melody Eliberto IvoryAustin (wound care)  Procedure/Significant Events: 9/13 echocardiogram Left ventricle:mild concentric hypertrophy. LVEF= 65% to 70%. -(grade 1 diastolic dysfunction). Doppler parameters - Aortic valve: moderate stenosis.  - Pulmonary arteries: PA peak pressure: 60 mm Hg (S).     Culture 10/19 blood right hand/arm positive Enterobacter Cloacae. 10/20 wound organisms present 10/20 MRSA by PCR negative   Antibiotics: Cefepime 10/20>> stopped 10/22 Vancomycin 10/20>> stopped 10/22 Ceftriaxone 10/22>> 10/232 Ciprofloxacin 10/23>>   DVT prophylaxis: Subcutaneous heparin   Devices NA   LINES / TUBES:      Continuous Infusions:    Objective: VITAL SIGNS: Temp: 97.7 F (36.5 C) (10/24 1015) Temp Source: Axillary (10/24 1015) BP: 133/27 mmHg (10/24 1021) Pulse Rate: 78 (10/24 1021) SPO2; FIO2:   Intake/Output Summary (Last 24 hours) at 08/16/14 1155 Last data filed at 08/15/14 2058  Gross per 24 hour  Intake      0 ml  Output    275 ml  Net   -275 ml     Exam: General: A./O. x0, NAD, follows all commands but repeats foot hurts, No acute respiratory distress Lungs: Clear to auscultation bilaterally without wheezes or crackles Cardiovascular: Regular rate and rhythm without murmur gallop or rub normal S1 and S2 Abdomen: Nontender, nondistended, soft, bowel sounds positive, no rebound, no ascites, no appreciable mass Extremities: Bilateral eschar on the Rt>Lt , bleeding, tender to palpation, negative pus, negative warmth. Bilateral lower extremity blistering   Data Reviewed: Basic Metabolic Panel:  Recent Labs Lab 08/12/14 0900 08/12/14 1126 08/14/14 0308 08/14/14 1441 08/14/14 2113 08/16/14 0438  NA 140 139 140  --  135* 137  K 4.5 4.8 3.1* 3.8 3.6* 3.0*  CL 103 103 103  --  98 100  CO2 24 24 27   --  22 22  GLUCOSE 111* 99  123*  --  181* 146*  BUN 44* 42* 10  --  8 5*  CREATININE 0.58 0.55 0.31*  --  0.25* 0.23*  CALCIUM 7.8* 7.9* 7.4*  --  7.4* 7.4*  MG  --   --   --  1.4* 2.2  --    Liver Function Tests:  Recent Labs Lab 08/11/14 2255 08/14/14 0308 08/14/14 2113  AST 14 13 17   ALT 16 12 13   ALKPHOS 118* 86 102  BILITOT 0.7 0.5 0.4  PROT 5.5* 4.8* 5.0*  ALBUMIN 1.6* 1.4* 1.6*   No results found for this basename: LIPASE, AMYLASE,  in the last 168 hours No results found for this basename: AMMONIA,  in the last 168 hours CBC:  Recent Labs Lab 08/11/14 2255 08/12/14 0058 08/12/14 0242 08/14/14 0308 08/14/14 2113 08/16/14 0438  WBC 23.7* 22.1* 22.5* 12.1* 14.8* 13.6*  NEUTROABS 20.8*  --   --   --  12.1*  --   HGB 9.7* 9.1* 9.0* 9.1* 8.8* 8.4*  HCT 29.0* 27.9* 27.8* 27.9* 26.9* 25.3*  MCV 83.6 84.0 85.5 83.0 83.0 83.0  PLT 786* 789* 751* 684* 638* 579*   Cardiac Enzymes: No results found for this basename: CKTOTAL, CKMB, CKMBINDEX, TROPONINI,  in the last 168 hours BNP (last 3 results) No results found for this basename: PROBNP,  in  the last 8760 hours CBG:  Recent Labs Lab 08/15/14 1623 08/15/14 2051 08/16/14 0012 08/16/14 0433 08/16/14 0806  GLUCAP 142* 160* 148* 154* 148*    Recent Results (from the past 240 hour(s))  URINE CULTURE     Status: None   Collection Time    08/11/14 10:46 PM      Result Value Ref Range Status   Specimen Description URINE, CATHETERIZED   Final   Special Requests NONE   Final   Culture  Setup Time     Final   Value: 08/12/2014 04:15     Performed at [facility]   Colony Count     Final   Value: >=100,000 COLONIES/ML     Performed at [facility]   Culture     Final   Value: ESCHERICHIA COLI     Performed at [facility]   Report Status 08/15/2014 FINAL   Final   Organism ID, Bacteria ESCHERICHIA COLI   Final  CULTURE, BLOOD (ROUTINE X 2)     Status: None   Collection Time    08/11/14 10:55 PM      Result  Value Ref Range Status   Specimen Description BLOOD RIGHT ARM   Final   Special Requests BOTTLES DRAWN AEROBIC AND ANAEROBIC 5CC EA   Final   Culture  Setup Time     Final   Value: 08/12/2014 04:21     Performed at [facility]   Culture     Final   Value: ENTEROBACTER CLOACAE     Note: Gram Stain Report Called to,Read Back By and Verified With: AMANDA PETTIFORD ON 08/12/2014 AT 9:52P BY WILEJ     Performed at [facility]   Report Status 08/14/2014 FINAL   Final   Organism ID, Bacteria ENTEROBACTER CLOACAE   Final  CULTURE, BLOOD (ROUTINE X 2)     Status: None   Collection Time    08/11/14 11:05 PM      Result Value Ref Range Status   Specimen Description BLOOD RIGHT HAND   Final   Special Requests BOTTLES DRAWN AEROBIC AND ANAEROBIC 4CC EA   Final   Culture  Setup Time     Final   Value: 08/12/2014 04:21     Performed at [facility]   Culture     Final   Value: ENTEROBACTER CLOACAE     Note: SUSCEPTIBILITIES PERFORMED ON PREVIOUS CULTURE WITHIN THE LAST 5 DAYS.     Note: Gram Stain Report Called to,Read Back By and Verified With: AMANDA PETTIFORD ON 08/12/2014 AT 9:52P BY WILEJ     Performed at [facility]   Report Status 08/14/2014 FINAL   Final  WOUND CULTURE     Status: None   Collection Time    08/12/14  8:02 AM      Result Value Ref Range Status   Specimen Description WOUND   Final   Special Requests SACRAL   Final   Gram Stain     Final   Value: NO WBC SEEN     NO SQUAMOUS EPITHELIAL CELLS SEEN     MODERATE GRAM POSITIVE COCCI     IN PAIRS IN CLUSTERS FEW GRAM NEGATIVE RODS     Performed at [facility]   Culture     Final   Value: MULTIPLE ORGANISMS PRESENT, NONE PREDOMINANT     Note: NO STAPHYLOCOCCUS AUREUS ISOLATED NO GROUP A STREP (S.PYOGENES) ISOLATED  Performed at [facility]   Report Status 08/14/2014 FINAL   Final  MRSA PCR SCREENING     Status: None   Collection Time    08/12/14  3:35 PM      Result  Value Ref Range Status   MRSA by PCR NEGATIVE  NEGATIVE Final   Comment:            The GeneXpert MRSA Assay (FDA     approved for NASAL specimens     only), is one component of a     comprehensive MRSA colonization     surveillance program. It is not     intended to diagnose MRSA     infection nor to guide or     monitor treatment for     MRSA infections.     Studies:  Recent x-ray studies have been reviewed in detail by the Attending Physician  Scheduled Meds:  Scheduled Meds: . ciprofloxacin  400 mg Intravenous Q12H  . collagenase   Topical Daily  . heparin subcutaneous  5,000 Units Subcutaneous 3 times per day  . insulin aspart  0-9 Units Subcutaneous 6 times per day  . metoprolol tartrate  12.5 mg Oral BID  . mirtazapine  7.5 mg Oral QHS  . silver nitrate applicators  10 Stick Topical Once  . sodium chloride  3 mL Intravenous Q12H    Time spent on care of this patient: 35 mins   Zannie Cove , MD   [facility] Office  [phone] Pager - [phone]  On-Call/Text Page:      [email or web address]      password [email or web address]  If 7PM-7AM, please contact night-coverage [email or web address] Password [email or web address] 08/16/2014, 11:55 AM   LOS: 5 days

## 2014-08-17 LAB — GLUCOSE, CAPILLARY
Glucose-Capillary: 191 mg/dL — ABNORMAL HIGH (ref 70–99)
Glucose-Capillary: 133 mg/dL — ABNORMAL HIGH (ref 70–99)
Glucose-Capillary: 151 mg/dL — ABNORMAL HIGH (ref 70–99)
Glucose-Capillary: 174 mg/dL — ABNORMAL HIGH (ref 70–99)
Glucose-Capillary: 145 mg/dL — ABNORMAL HIGH (ref 70–99)

## 2014-08-17 NOTE — Progress Notes (Signed)
[place] TEAM 1 Transfer 10/24  Lorraine Tran DOB: November 06, 1921 DOA: 08/11/2014 PCP: Darrow Bussing, MD  Admit HPI / Brief Narrative: 80 year old female with history of A. fib on Eliquis, type 2 diabetes mellitus with A1c of 6.7, recently started on insulin, hx of stroke, sacral decubitus, dementia, recent hospitalization for right tibial fracture status post repair and discharged to skilled nursing facility was sent to the ED with altered mental status. Patient is confused and nonverbal, unable to provide any history. Patient reportedly was confused, lethargic and poorly responsive in the nursing home. Upon arrival to the ED she had a low-grade temperature of 99.8 Fahrenheit, tachycardic to 128, tachypnea of the 20 with normal blood pressure. O2 sat was maintained.  Blood work done showed WBC of 23.7 with UA was suggestive of UTI. She also was found to have foul-smelling sacral decubitus ulcer. She was initially treated with IV vancomycin and cefepime for sepsis and hospitalist admission requested to step down unit. Blood cultures and urine cultures sent from the ED.  Chest x-ray and head CT unremarkable.   At baseline per daughter she has mild dementia but seems quite oriented. She saw her 1 day prior to admission and was at her baseline. She was participating with PT at SNF . She has been eating well as well.  As per daughter , patient has new bedsores in the back starting only 3 weeks back. Also has new eschars in b/l foot which daughter thinks is from the cast/ dressing.  Urine cultures drawn on 08/11/2014 grew Enterobacter and Escherichia coli, both organisms being susceptible to ciprofloxacin. On 08/15/2014 ceftriaxone was discontinued as she was started on Cipro 400 mg IV twice a day. Given clinical stability she was transferred out of the step down unit to the floor. Speech pathology evaluated patient on 08/15/2014 recommended dysphasia 1 thin liquid  diet.   HPI/Subjective: No complaints, mumbles few words, no events overnight  Assessment/Plan: Severe sepsis - gram negative rod bacteremia  -Likely a combination of UTI and infected sacral decubitus  -Enterobacter bacteremia on 08/11/2014  -Ceftriaxone was discontinued on 08/15/2014 started on ciprofloxacin 400 mg IV twice a day, urine cultures growing Enterobacter and Escherichia coli, both organisms susceptible to ciprofloxacin, will RX for 2 weeks from 10/23  -Wound care/Gen Surgery recommended hydrotherapy for sacral ulcer    Decubitus ulcer of sacral region, stage 3  -Gen Surgery/Wound Care recommended hydrotherapy   -continue hydrotherapy per PT  Urinary tract infection (Enterobacter Cloacae.)  -As mentioned above, urine cultures growing Enterobacter and Escherichia coli -Patient changed to ciprofloxacin 400 mg IV twice a day on 08/15/2014  Dementia -stable  Hypertension  -stable now, Po metoprolol  Acute toxic metabolic encephalopathy  -Patient has mild underlying dementia which is worsened with sepsis  -Head CT negative  -stable, likely at baseline   Metabolic acidosis / lactic acidosis  - resolved, continue to monitor  Hypokalemia -replace  Hypomagnesemia  -Magnesium IV 3 gm Magnesium goal> 2  DM type 2  -Cont sensitive SSI - CBG currently reasonably controlled   Bilateral foot wounds  -due to DM and pressure wounds - Wound Care following - no plan for further workup at this time - plain xrays w/o evidence of osteo at this time   Atrial fibrillation  -Currently in sinus rhythm  - restarted metoprolol at 12.5 mg by mouth twice a day - not on Anticoagulation   Iron deficiency Anemia  -Continue iron supplements when able to tolerate oral intake  Acute kidney injury  -Due to dehydration/sepsis - continue to hydrate - renal fxn improving   Protein calorie malnutrition  -Started pure diet  GLOBAL: GIVEN Advanced age, Dementia, Sacral decubitus  wounds, bed bound status, PC Malnutrition, DM, discussed poor prognosis with daughter and recommended Palliative eval. She is agreeable to a Palliative meeting and seems very reasonable   Code Status: DNR Family Communication:none at bedside, called and d/w daughter 10/25 via telephone Disposition Plan: Likely d/c to SNF when medically stable   Consultants: Dr. Abigail Miyamoto (surgery) RN Melody Eliberto Ivory (wound care)  Procedure/Significant Events: 9/13 echocardiogram Left ventricle:mild concentric hypertrophy. LVEF= 65% to 70%. -(grade 1 diastolic dysfunction). Doppler parameters - Aortic valve: moderate stenosis.  - Pulmonary arteries: PA peak pressure: 60 mm Hg (S).     Culture 10/19 blood right hand/arm positive Enterobacter Cloacae. 10/20 wound organisms present 10/20 MRSA by PCR negative   Antibiotics: Cefepime 10/20>> stopped 10/22 Vancomycin 10/20>> stopped 10/22 Ceftriaxone 10/22>> 10/232 Ciprofloxacin 10/23>>   DVT prophylaxis: Subcutaneous heparin   Devices NA   LINES / TUBES:      Continuous Infusions:    Objective: VITAL SIGNS: Temp: 99 F (37.2 C) (10/25 0419) Temp Source: Oral (10/25 0419) BP: 140/45 mmHg (10/25 0419) Pulse Rate: 88 (10/25 0419) SPO2; FIO2:   Intake/Output Summary (Last 24 hours) at 08/17/14 1049 Last data filed at 08/17/14 4098  Gross per 24 hour  Intake     60 ml  Output    900 ml  Net   -840 ml     Exam: General: A./O. X to self only, NAD, follows all commands but repeats foot hurts, No acute respiratory distress Lungs: Clear to auscultation bilaterally without wheezes or crackles Cardiovascular: Regular rate and rhythm without murmur gallop or rub normal S1 and S2 Abdomen: Nontender, nondistended, soft, bowel sounds positive, no rebound, no ascites, no appreciable mass Extremities: Bilateral eschar on the Rt>Lt , bleeding, tender to palpation, negative pus, negative warmth. Bilateral lower extremity  blistering Sacral decub ulcers with dressing   Data Reviewed: Basic Metabolic Panel:  Recent Labs Lab 08/12/14 0900 08/12/14 1126 08/14/14 0308 08/14/14 1441 08/14/14 2113 08/16/14 0438  NA 140 139 140  --  135* 137  K 4.5 4.8 3.1* 3.8 3.6* 3.0*  CL 103 103 103  --  98 100  CO2 24 24 27   --  22 22  GLUCOSE 111* 99 123*  --  181* 146*  BUN 44* 42* 10  --  8 5*  CREATININE 0.58 0.55 0.31*  --  0.25* 0.23*  CALCIUM 7.8* 7.9* 7.4*  --  7.4* 7.4*  MG  --   --   --  1.4* 2.2  --    Liver Function Tests:  Recent Labs Lab 08/11/14 2255 08/14/14 0308 08/14/14 2113  AST 14 13 17   ALT 16 12 13   ALKPHOS 118* 86 102  BILITOT 0.7 0.5 0.4  PROT 5.5* 4.8* 5.0*  ALBUMIN 1.6* 1.4* 1.6*   No results found for this basename: LIPASE, AMYLASE,  in the last 168 hours No results found for this basename: AMMONIA,  in the last 168 hours CBC:  Recent Labs Lab 08/11/14 2255 08/12/14 0058 08/12/14 0242 08/14/14 0308 08/14/14 2113 08/16/14 0438  WBC 23.7* 22.1* 22.5* 12.1* 14.8* 13.6*  NEUTROABS 20.8*  --   --   --  12.1*  --   HGB 9.7* 9.1* 9.0* 9.1* 8.8* 8.4*  HCT 29.0* 27.9* 27.8* 27.9* 26.9* 25.3*  MCV 83.6 84.0  85.5 83.0 83.0 83.0  PLT 786* 789* 751* 684* 638* 579*   Cardiac Enzymes: No results found for this basename: CKTOTAL, CKMB, CKMBINDEX, TROPONINI,  in the last 168 hours BNP (last 3 results) No results found for this basename: PROBNP,  in the last 8760 hours CBG:  Recent Labs Lab 08/16/14 1721 08/16/14 2011 08/16/14 2350 08/17/14 0416 08/17/14 0752  GLUCAP 188* 156* 126* 191* 133*    Recent Results (from the past 240 hour(s))  URINE CULTURE     Status: None   Collection Time    08/11/14 10:46 PM      Result Value Ref Range Status   Specimen Description URINE, CATHETERIZED   Final   Special Requests NONE   Final   Culture  Setup Time     Final   Value: 08/12/2014 04:15     [facility]formed [facility]   Colony Count     Final   Value: >=100,000  COLONIES/ML     [facility] at [facility]   Culture     Final   Value: ESCHERICHIA COLI     [facility] at [facility]   Report Status 08/15/2014 FINAL   Final   Organism ID, Bacteria ESCHERICHIA COLI   Final  CULTURE, BLOOD (ROUTINE X 2)     Status: None   Collection Time    08/11/14 10:55 PM      Result Value Ref Range Status   Specimen Description BLOOD RIGHT ARM   Final   Special Requests BOTTLES DRAWN AEROBIC AND ANAEROBIC 5CC EA   Final   Culture  Setup Time     Final   Value: 08/12/2014 04:21     [facility]formed at [facility]   Culture     Final   Value: ENTEROBACTER CLOACAE     Note: Gram Stain Report Called to,Read Back By and Verified With: AMANDA PETTIFORD ON 08/12/2014 AT 9:52P BY WILEJ     Performed at [facility]   Report Status 08/14/2014 FINAL   Final   Organism ID, Bacteria ENTEROBACTER CLOACAE   Final  CULTURE, BLOOD (ROUTINE X 2)     Status: None   Collection Time    08/11/14 11:05 PM      Result Value Ref Range Status   Specimen Description BLOOD RIGHT HAND   Final   Special Requests BOTTLES DRAWN AEROBIC AND ANAEROBIC 4CC EA   Final   Culture  Setup Time     Final   Value: 08/12/2014 04:21     Performed at [facility]   Culture     Final   Value: ENTEROBACTER CLOACAE     Note: SUSCEPTIBILITIES PERFORMED ON PREVIOUS CULTURE WITHIN THE LAST 5 DAYS.     Note: Gram Stain Report Called to,Read Back By and Verified With: AMANDA PETTIFORD ON 08/12/2014 AT 9:52P BY WILEJ     [facility]formed at [facility]   Report Status 08/14/2014 FINAL   Final  WOUND CULTURE     Status: None   Collection Time    08/12/14  8:02 AM      Result Value Ref Range Status   Specimen Description WOUND   Final   Special Requests SACRAL   Final   Gram Stain     Final   Value: NO WBC SEEN     NO SQUAMOUS EPITHELIAL CELLS SEEN     MODERATE GRAM POSITIVE COCCI     IN PAIRS IN CLUSTERS FEW GRAM  NEGATIVE RODS     [facility]formed at [facility]    Culture     Final   Value: MULTIPLE ORGANISMS PRESENT, NONE PREDOMINANT     Note: NO STAPHYLOCOCCUS AUREUS ISOLATED NO GROUP A STREP (S.PYOGENES) ISOLATED     [facility]formed at [facility]   Report Status 08/14/2014 FINAL   Final  MRSA PCR SCREENING     Status: None   Collection Time    08/12/14  3:35 PM      Result Value Ref Range Status   MRSA by PCR NEGATIVE  NEGATIVE Final   Comment:            The GeneXpert MRSA Assay (FDA     approved for NASAL specimens     only), is one component of a     comprehensive MRSA colonization     surveillance program. It is not     intended to diagnose MRSA     infection nor to guide or     monitor treatment for     MRSA infections.     Studies:  Recent x-ray studies have been reviewed in detail by the Attending Physician  Scheduled Meds:  Scheduled Meds: . ciprofloxacin  400 mg Intravenous Q12H  . collagenase   Topical Daily  . heparin subcutaneous  5,000 Units Subcutaneous 3 times per day  . insulin aspart  0-9 Units Subcutaneous 6 times per day  . metoprolol tartrate  12.5 mg Oral BID  . mirtazapine  7.5 mg Oral QHS  . silver nitrate applicators  10 Stick Topical Once  . sodium chloride  3 mL Intravenous Q12H    Time spent on care of this patient: 35 mins   Zannie CoveJOSEPH,Ted Goodner , MD   [facility] Office  [phone] Pager - [phone]  On-Call/Text Page:      [facility]ore[facility] [email or web address]      password [email or web address]  If 7PM-7AM, please contact night-coverage [email or web address] Password [email or web address] 08/17/2014, 10:49 AM   LOS: 6 days

## 2014-08-18 DIAGNOSIS — I739 Peripheral vascular disease, unspecified: Secondary | ICD-10-CM

## 2014-08-18 LAB — BASIC METABOLIC PANEL
Anion gap: 13 (ref 5–15)
BUN: 3 mg/dL — AB (ref 6–23)
CHLORIDE: 97 meq/L (ref 96–112)
CO2: 27 meq/L (ref 19–32)
CREATININE: 0.31 mg/dL — AB (ref 0.50–1.10)
Calcium: 7.6 mg/dL — ABNORMAL LOW (ref 8.4–10.5)
GFR calc Af Amer: >90 mL/min (ref >90)
GFR calc non Af Amer: >90 mL/min (ref >90)
GLUCOSE: 140 mg/dL — AB (ref 70–99)
POTASSIUM: 4.1 meq/L (ref 3.7–5.3)
Sodium: 137 mEq/L (ref 137–147)

## 2014-08-18 LAB — CBC
HEMATOCRIT: 26.2 % — AB (ref 36.0–46.0)
Hemoglobin: 8.8 g/dL — ABNORMAL LOW (ref 12.0–15.0)
MCH: 27.2 pg (ref 26.0–34.0)
MCHC: 33.6 g/dL (ref 30.0–36.0)
MCV: 81.1 fL (ref 78.0–100.0)
RBC: 3.23 MIL/uL — AB (ref 3.87–5.11)
RDW: 15.3 % (ref 11.5–15.5)
WBC: 12.5 10*3/uL — ABNORMAL HIGH (ref 4.0–10.5)

## 2014-08-18 LAB — GLUCOSE, CAPILLARY
GLUCOSE-CAPILLARY: 132 mg/dL — AB (ref 70–99)
GLUCOSE-CAPILLARY: 176 mg/dL — AB (ref 70–99)
GLUCOSE-CAPILLARY: 251 mg/dL — AB (ref 70–99)
Glucose-Capillary: 130 mg/dL — ABNORMAL HIGH (ref 70–99)
Glucose-Capillary: 143 mg/dL — ABNORMAL HIGH (ref 70–99)
Glucose-Capillary: 159 mg/dL — ABNORMAL HIGH (ref 70–99)

## 2014-08-18 MED ORDER — INSULIN ASPART 100 UNIT/ML ~~LOC~~ SOLN
0.0000 [IU] | Freq: Three times a day (TID) | SUBCUTANEOUS | Status: DC
  Administered 2014-08-18: 2 [IU] via SUBCUTANEOUS
  Administered 2014-08-18: 3 [IU] via SUBCUTANEOUS
  Administered 2014-08-19: 2 [IU] via SUBCUTANEOUS
  Administered 2014-08-19: 3 [IU] via SUBCUTANEOUS
  Administered 2014-08-19: 2 [IU] via SUBCUTANEOUS
  Administered 2014-08-19 – 2014-08-20 (×2): 3 [IU] via SUBCUTANEOUS
  Administered 2014-08-20: 1 [IU] via SUBCUTANEOUS
  Administered 2014-08-20: 3 [IU] via SUBCUTANEOUS
  Administered 2014-08-21: 2 [IU] via SUBCUTANEOUS
  Administered 2014-08-21: 3 [IU] via SUBCUTANEOUS
  Administered 2014-08-21: 2 [IU] via SUBCUTANEOUS
  Administered 2014-08-21: 3 [IU] via SUBCUTANEOUS
  Administered 2014-08-22: 1 [IU] via SUBCUTANEOUS

## 2014-08-18 MED ORDER — GLUCERNA SHAKE PO LIQD
237.0000 mL | Freq: Two times a day (BID) | ORAL | Status: DC
  Administered 2014-08-18 – 2014-08-22 (×5): 237 mL via ORAL

## 2014-08-18 MED ORDER — INSULIN ASPART 100 UNIT/ML ~~LOC~~ SOLN: 0.0000 [IU] | Freq: Three times a day (TID) | SUBCUTANEOUS | Status: DC

## 2014-08-18 MED ORDER — PRO-STAT SUGAR FREE PO LIQD
30.0000 mL | Freq: Three times a day (TID) | ORAL | Status: DC
  Administered 2014-08-18 – 2014-08-20 (×3): 30 mL via ORAL
  Administered 2014-08-20 – 2014-08-21 (×3): via ORAL
  Administered 2014-08-21 – 2014-08-22 (×2): 30 mL via ORAL
  Filled 2014-08-18 (×14): qty 30

## 2014-08-18 NOTE — Progress Notes (Signed)
NUTRITION FOLLOW UP  Pt meets criteria for SEVERE MALNUTRITION in the context of acute illness/injury as evidenced by a 4% weight loss in one week and moderate fat and muscle mass loss.  INTERVENTION: Provide Glucerna Shake po BID, each supplement provides 220 kcal and 10 grams of protein Provide 30 ml Prostat po TID, each supplement provides 100 kcal and 15 grams of protein.  Encourage PO intake. RD to follow for nutrition care plan  NUTRITION DIAGNOSIS: Increased nutrient needs related to wound healing/infection as evidenced by estimated nutrition need; ongoing  Goal: Pt to meet >/= 90% of their estimated nutrition needs, not met  Monitor:  PO & supplemental intake, weight, labs, I/O's  Reason for Assessment: Low Braden  80 y.o. female  Admitting Dx: Severe sepsis  ASSESSMENT: 80 year old Female with history of A. fib on Eliquis, type 2 diabetes mellitus with A1c of 6.7, recently started on insulin, hx of stroke, sacral decubitus, dementia, recent hospitalization for right tibial fracture status post repair; sent to the ED with AMS; upon arrival to the ED she had a low-grade temperature of 99.8 Fahrenheit, tachycardic to 128, tachypnea of the 20 with normal blood pressure.   10/26- Pt was asleep and non responsive during time of visit. Spoke with family who were at pt bedside. Family reports pt has not been eating well since start of hospitalization. Meal completion is 0-15%. Family reports PTA, pt had a great appetite and was eating fine with 3 full meals a day. Family reports pt's usual body weight is 126-130 lbs. Pt with a 4% weight loss in 1 week. Family is agreeable on oral supplements. They report pt sometimes drinks Glucerna at home. Will order glucerna and prostat for pt.   Nutrition Focused Physical Exam:  Subcutaneous Fat:  Orbital Region: N/A Upper Arm Region: Moderate depletion Thoracic and Lumbar Region: WNL  Muscle:  Temple Region: Moderate depletion Clavicle  Bone Region: Moderate depletion Clavicle and Acromion Bone Region: Moderate depletion Scapular Bone Region: N/A Dorsal Hand: Moderate depletion Patellar Region: N/A Anterior Thigh Region: N/A Posterior Calf Region: Moderate depletion  Edema: +1 LE   Labs: Low BUN, creatinine, and calcium.  Height: Ht Readings from Last 1 Encounters:  08/11/14 _0  (1.676 m)    Weight: Wt Readings from Last 1 Encounters:  08/16/14 121 lb (54.885 kg)    Ideal Body Weight: 130 lb  % Ideal Body Weight: 93%  Wt Readings from Last 10 Encounters:  08/16/14 121 lb (54.885 kg)  07/08/14 143 lb 6.4 oz (65.046 kg)  07/08/14 143 lb 6.4 oz (65.046 kg)  10/01/13 127 lb (57.607 kg)  09/12/12 114 lb (51.71 kg)  07/23/12 119 lb 12.8 oz (54.341 kg)  06/06/12 118 lb 12.8 oz (53.887 kg)  04/24/12 124 lb 1.9 oz (56.3 kg)    Usual Body Weight: 127 lb  % Usual Body Weight: 97%  BMI:  Body mass index is 19.54 kg/(m^2).  Re-Estimated Nutritional Needs: Kcal: 1500-1700 Protein: 70-80 gm Fluid: >/= 1.5 L  Skin:  Unstageable pressure ulcers to: sacrum L dorsal foot L lateral foot L heel R dorsal foot R dorsal foot proximal R heel R dorsal foot distal Suspected deep tissue injury (SDTI) to L medial foot  Diet Order: Dysphagia 1   Intake/Output Summary (Last 24 hours) at 08/18/14 1030 Last data filed at 08/18/14 0946  Gross per 24 hour  Intake     20 ml  Output    200 ml  Net   -180 ml  Labs:   Recent Labs Lab 08/14/14 0308 08/14/14 1441 08/14/14 2113 08/16/14 0438 08/18/14 0615  NA 140  --  135* 137 137  K 3.1* 3.8 3.6* 3.0* 4.1  CL 103  --  98 100 97  CO2 27  --  _0 BUN 10  --  8 5* 3*  CREATININE 0.31*  --  0.25* 0.23* 0.31*  CALCIUM 7.4*  --  7.4* 7.4* 7.6*  MG  --  1.4* 2.2  --   --   GLUCOSE 123*  --  181* 146* 140*    CBG (last 3)   Recent Labs  08/18/14 0117 08/18/14 0355 08/18/14 0752  GLUCAP 130* 143* 132*    Scheduled Meds: . ciprofloxacin   400 mg Intravenous Q12H  . collagenase   Topical Daily  . heparin subcutaneous  5,000 Units Subcutaneous 3 times per day  . insulin aspart  0-9 Units Subcutaneous 6 times per day  . metoprolol tartrate  12.5 mg Oral BID  . mirtazapine  7.5 mg Oral QHS  . silver nitrate applicators  10 Stick Topical Once  . sodium chloride  3 mL Intravenous Q12H    Continuous Infusions:    Past Medical History  Diagnosis Date  . Diabetes mellitus   . Dementia   . Stroke   . Atrial fibrillation 06/06/2012    Eliquis started 8/13  . Aortic stenosis     a. Echo 6/13:  mild LVH, EF 65-70%, LV vigorous, mod AS, mean 26 mmHg, MAC, mild MS, mean 4 mmHg, mild LAE, PASP 48    Past Surgical History  Procedure Laterality Date  . Abdominal hysterectomy    . Cholecystectomy    . Tibia im nail insertion Left 07/06/2014    Procedure: INTRAMEDULLARY (IM) NAIL TIBIAL;  Surgeon: Mcarthur Rossetti, MD;  Location: [place];  Service: Orthopedics;  Laterality: Left;  . Tibia im nail insertion Right 07/06/2014    Procedure: INTRAMEDULLARY (IM) NAIL TIBIAL;  Surgeon: Mcarthur Rossetti, MD;  Location: [place];  Service: Orthopedics;  Laterality: Right;    Kallie Locks, MS, RD, LDN Pager # [phone] After hours/ weekend pager # [phone]

## 2014-08-18 NOTE — Progress Notes (Addendum)
[place] TEAM 1 Transfer 10/24  Lorraine Tran ZOX:[id number] DOB: [birth year] DOA: 08/11/2014 PCP: Darrow Bussing, MD  Admit HPI / Brief Narrative: 80 year old female with history of A. fib on Eliquis, type 2 diabetes mellitus with A1c of 6.7, recently started on insulin, hx of stroke, sacral decubitus, dementia, recent hospitalization for right tibial fracture status post repair and discharged to skilled nursing facility was sent to the ED with altered mental status. Patient is confused and nonverbal, unable to provide any history. Patient reportedly was confused, lethargic and poorly responsive in the nursing home. Upon arrival to the ED she had a low-grade temperature of 99.8 Fahrenheit, tachycardic to 128, tachypnea of the 20 with normal blood pressure. O2 sat was maintained.  Blood work done showed WBC of 23.7 with UA was suggestive of UTI. She also was found to have foul-smelling sacral decubitus ulcer. She was initially treated with IV vancomycin and cefepime for sepsis and hospitalist admission requested to step down unit. Blood cultures and urine cultures sent from the ED.  Chest x-ray and head CT unremarkable.   At baseline per daughter she has mild dementia but seems quite oriented. She saw her 1 day prior to admission and was at her baseline. She was participating with PT at SNF . She has been eating well as well.  As per daughter , patient has new bedsores in the back starting only 3 weeks back. Also has new eschars in b/l foot which daughter thinks is from the cast/ dressing.  Urine cultures drawn on 08/11/2014 grew Enterobacter and Escherichia coli, both organisms being susceptible to ciprofloxacin. On 08/15/2014 ceftriaxone was discontinued as she was started on Cipro 400 mg IV twice a day. Given clinical stability she was transferred out of the step down unit to the floor. Speech pathology evaluated patient on 08/15/2014 recommended dysphasia 1 thin liquid diet. Currently getting  hydrotherapy and Palliative meeting pending   HPI/Subjective: No complaints, mumbles few words, no events overnight  Assessment/Plan: Severe sepsis - gram negative rod bacteremia  -Likely a combination of UTI and infected sacral decubitus  -Enterobacter bacteremia on 08/11/2014  -Ceftriaxone was discontinued on 08/15/2014 started on ciprofloxacin 400 mg IV twice a day, urine cultures growing Enterobacter and Escherichia coli, both organisms susceptible to ciprofloxacin, will RX for 2 weeks from 10/23, till 11/6 -Wound care/Gen Surgery recommended hydrotherapy for sacral ulcer   -per PT will need 3-9more days of Hydrotherapy  Decubitus ulcer of sacral region, stage 3  -Gen Surgery/Wound Care recommended hydrotherapy   -continue hydrotherapy per PT  Urinary tract infection (Enterobacter Cloacae.)  -As mentioned above, urine cultures growing Enterobacter and Escherichia coli -Patient changed to ciprofloxacin 400 mg IV twice a day on 08/15/2014  Dementia -stable  Hypertension  -stable now, Po metoprolol  Acute toxic metabolic encephalopathy  -Patient has mild underlying dementia which is worsened with sepsis  -Head CT negative  -stable, likely at baseline   Metabolic acidosis / lactic acidosis  - resolved, continue to monitor  Hypokalemia -replace  Moderate Aortic stenosis  DM type 2  -Cont sensitive SSI - CBG currently reasonably controlled   Bilateral foot wounds  -due to DM and pressure wounds - Wound Care following - no plan for further workup at this time - plain xrays w/o evidence of osteo at this time   Atrial fibrillation  -Currently in sinus rhythm  - restarted metoprolol at 12.5 mg by mouth twice a day - not on Anticoagulation   Iron deficiency Anemia  -Continue  iron supplements when able to tolerate oral intake   Acute kidney injury  -Due to dehydration/sepsis -resolved with hydration  Protein calorie malnutrition  -Started pure diet  GLOBAL:  GIVEN Advanced age, Dementia, Sacral decubitus wounds, bed bound status, PC Malnutrition, DM, discussed poor prognosis with daughter on 10/25 and recommended Palliative eval. She is agreeable to a Palliative meeting and seems very reasonable -Palliative consult requested on 10/25   Code Status: DNR Family Communication:none at bedside, called and d/w daughter 10/25 via telephone Disposition Plan: Likely d/c to SNF when medically stable   Consultants: Dr. Abigail Miyamotoouglas Blackman (surgery) RN Melody Eliberto IvoryAustin (wound care)  Procedure/Significant Events: 9/13 echocardiogram Left ventricle:mild concentric hypertrophy. LVEF= 65% to 70%. -(grade 1 diastolic dysfunction). Doppler parameters - Aortic valve: moderate stenosis.  - Pulmonary arteries: PA peak pressure: 60 mm Hg (S).     Culture 10/19 blood right hand/arm positive Enterobacter Cloacae. 10/20 wound organisms present 10/20 MRSA by PCR negative   Antibiotics: Cefepime 10/20>> stopped 10/22 Vancomycin 10/20>> stopped 10/22 Ceftriaxone 10/22>> 10/232 Ciprofloxacin 10/23>>   DVT prophylaxis: Subcutaneous heparin   Devices NA   LINES / TUBES:      Continuous Infusions:    Objective: VITAL SIGNS: Temp: 98.7 F (37.1 C) (10/26 0945) Temp Source: Oral (10/26 0945) BP: 127/73 mmHg (10/26 0945) Pulse Rate: 72 (10/26 0945) SPO2; FIO2:   Intake/Output Summary (Last 24 hours) at 08/18/14 1057 Last data filed at 08/18/14 0946  Gross per 24 hour  Intake     20 ml  Output    200 ml  Net   -180 ml     Exam: General: A./O. X to self only, NAD, follows some commands, chronically ill appearing Lungs: Clear to auscultation bilaterally without wheezes or crackles Cardiovascular: Regular rate and rhythm, systolic ejection murmur, no gallop or rub normal S1 and S2 Abdomen: Nontender, nondistended, soft, bowel sounds positive, no rebound, no ascites, no appreciable mass Extremities: Bilateral eschar on the Rt>Lt , bleeding,  tender to palpation, negative pus, negative warmth. Bilateral lower extremity blistering Sacral decub ulcers with dressing   Data Reviewed: Basic Metabolic Panel:  Recent Labs Lab 08/12/14 1126 08/14/14 0308 08/14/14 1441 08/14/14 2113 08/16/14 0438 08/18/14 0615  NA 139 140  --  135* 137 137  K 4.8 3.1* 3.8 3.6* 3.0* 4.1  CL 103 103  --  98 100 97  CO2 24 27  --  22 22 27   GLUCOSE 99 123*  --  181* 146* 140*  BUN 42* 10  --  8 5* 3*  CREATININE 0.55 0.31*  --  0.25* 0.23* 0.31*  CALCIUM 7.9* 7.4*  --  7.4* 7.4* 7.6*  MG  --   --  1.4* 2.2  --   --    Liver Function Tests:  Recent Labs Lab 08/11/14 2255 08/14/14 0308 08/14/14 2113  AST 14 13 17   ALT 16 12 13   ALKPHOS 118* 86 102  BILITOT 0.7 0.5 0.4  PROT 5.5* 4.8* 5.0*  ALBUMIN 1.6* 1.4* 1.6*   No results found for this basename: LIPASE, AMYLASE,  in the last 168 hours No results found for this basename: AMMONIA,  in the last 168 hours CBC:  Recent Labs Lab 08/11/14 2255  08/12/14 0242 08/14/14 0308 08/14/14 2113 08/16/14 0438 08/18/14 0615  WBC 23.7*  < > 22.5* 12.1* 14.8* 13.6* 12.5*  NEUTROABS 20.8*  --   --   --  12.1*  --   --   HGB 9.7*  < >  9.0* 9.1* 8.8* 8.4* 8.8*  HCT 29.0*  < > 27.8* 27.9* 26.9* 25.3* 26.2*  MCV 83.6  < > 85.5 83.0 83.0 83.0 81.1  PLT 786*  < > 751* 684* 638* 579* PLATELET CLUMPS NOTED ON SMEAR, COUNT APPEARS INCREASED  < > = values in this interval not displayed. Cardiac Enzymes: No results found for this basename: CKTOTAL, CKMB, CKMBINDEX, TROPONINI,  in the last 168 hours BNP (last 3 results) No results found for this basename: PROBNP,  in the last 8760 hours CBG:  Recent Labs Lab 08/17/14 1642 08/17/14 2101 08/18/14 0117 08/18/14 0355 08/18/14 0752  GLUCAP 174* 145* 130* 143* 132*    Recent Results (from the past 240 hour(s))  URINE CULTURE     Status: None   Collection Time    08/11/14 10:46 PM      Result Value Ref Range Status   Specimen Description  URINE, CATHETERIZED   Final   Special Requests NONE   Final   Culture  Setup Time     Final   Value: 08/12/2014 04:15     Performed at [facility]   Colony Count     Final   Value: >=100,000 COLONIES/ML     Performed at [facility]   Culture     Final   Value: ESCHERICHIA COLI     Performed at [facility]   Report Status 08/15/2014 FINAL   Final   Organism ID, Bacteria ESCHERICHIA COLI   Final  CULTURE, BLOOD (ROUTINE X 2)     Status: None   Collection Time    08/11/14 10:55 PM      Result Value Ref Range Status   Specimen Description BLOOD RIGHT ARM   Final   Special Requests BOTTLES DRAWN AEROBIC AND ANAEROBIC 5CC EA   Final   Culture  Setup Time     Final   Value: 08/12/2014 04:21     Performed at [facility]   Culture     Final   Value: ENTEROBACTER CLOACAE     Note: Gram Stain Report Called to,Read Back By and Verified With: AMANDA PETTIFORD ON 08/12/2014 AT 9:52P BY WILEJ     Performed at [facility]   Report Status 08/14/2014 FINAL   Final   Organism ID, Bacteria ENTEROBACTER CLOACAE   Final  CULTURE, BLOOD (ROUTINE X 2)     Status: None   Collection Time    08/11/14 11:05 PM      Result Value Ref Range Status   Specimen Description BLOOD RIGHT HAND   Final   Special Requests BOTTLES DRAWN AEROBIC AND ANAEROBIC 4CC EA   Final   Culture  Setup Time     Final   Value: 08/12/2014 04:21     Performed at [facility]   Culture     Final   Value: ENTEROBACTER CLOACAE     Note: SUSCEPTIBILITIES PERFORMED ON PREVIOUS CULTURE WITHIN THE LAST 5 DAYS.     Note: Gram Stain Report Called to,Read Back By and Verified With: AMANDA PETTIFORD ON 08/12/2014 AT 9:52P BY Serafina MitchellWILEJ     Performed at [facility]   Report Status 08/14/2014 FINAL   Final  WOUND CULTURE     Status: None   Collection Time    08/12/14  8:02 AM      Result Value Ref Range Status   Specimen Description WOUND   Final   Special Requests SACRAL   Final  Gram Stain     Final   Value: NO WBC SEEN     NO SQUAMOUS EPITHELIAL CELLS SEEN     MODERATE GRAM POSITIVE COCCI     IN PAIRS IN CLUSTERS FEW GRAM NEGATIVE RODS     Performed at [facility]   Culture     Final   Value: MULTIPLE ORGANISMS PRESENT, NONE PREDOMINANT     Note: NO STAPHYLOCOCCUS AUREUS ISOLATED NO GROUP A STREP (S.PYOGENES) ISOLATED     Performed at [facility]   Report Status 08/14/2014 FINAL   Final  MRSA PCR SCREENING     Status: None   Collection Time    08/12/14  3:35 PM      Result Value Ref Range Status   MRSA by PCR NEGATIVE  NEGATIVE Final   Comment:            The GeneXpert MRSA Assay (FDA     approved for NASAL specimens     only), is one component of a     comprehensive MRSA colonization     surveillance program. It is not     intended to diagnose MRSA     infection nor to guide or     monitor treatment for     MRSA infections.     Studies:  Recent x-ray studies have been reviewed in detail by the Attending Physician  Scheduled Meds:  Scheduled Meds: . ciprofloxacin  400 mg Intravenous Q12H  . collagenase   Topical Daily  . heparin subcutaneous  5,000 Units Subcutaneous 3 times per day  . insulin aspart  0-9 Units Subcutaneous 6 times per day  . metoprolol tartrate  12.5 mg Oral BID  . mirtazapine  7.5 mg Oral QHS  . silver nitrate applicators  10 Stick Topical Once  . sodium chloride  3 mL Intravenous Q12H    Time spent on care of this patient: 35 mins   Zannie Cove , MD   [facility] Office  [phone] Pager - [phone]  On-Call/Text Page:      [email or web address]      password [email or web address]  If 7PM-7AM, please contact night-coverage [email or web address] Password [email or web address] 08/18/2014, 10:57 AM   LOS: 7 days

## 2014-08-18 NOTE — Care Management Note (Signed)
CARE MANAGEMENT NOTE 08/18/2014  Patient:  Lorraine Tran,Lorraine Tran   Account Number:  [id number]  Date Initiated:  08/14/2014  Documentation initiated by:  MAYO,HENRIETTA  Subjective/Objective Assessment:   dx sepsis; resident of [facility]     Action/Plan:   Anticipated DC Date:     Anticipated DC Plan:    In-house referral  Clinical Social Worker      DC Planning Services  CM consult      Choice offered to / List presented to:             Status of service:  In process, will continue to follow Medicare Important Message given?  YES (If response is "NO", the following Medicare IM given date fields will be blank) Date Medicare IM given:  08/14/2014 Medicare IM given by:  MAYO,HENRIETTA Date Additional Medicare IM given:  08/18/2014 Additional Medicare IM given by:  [facility]  Discharge Disposition:    Per UR Regulation:  Reviewed for med. necessity/level of care/duration of stay  If discussed at Long Length of Stay Meetings, dates discussed:    Comments:

## 2014-08-18 NOTE — Evaluation (Signed)
Physical Therapy Evaluation Patient Details Name: Lorraine Tran MRN: [id number] DOB: 29-Jul-1922 Today's Date: 08/18/2014   History of Present Illness  Patient is a 80 y/o female admitted for AMS from [facility]. Upon arrival in the ED, UA was suggestive of UTI , along with positive ketones. She also was found to have foul-smelling sacral decubitus ulcer, low-grade temperature of 99.8  Fahrenheit, tachycardic to 128 and tachypnea of 20. Admitted with severe sepsis likely a combination of UTI and infected sacral decubitus. PMH of A-fb on Eliquis, type 2 diabetes mellitus with A1c of 6.7, recently started on insulin, CVA, sacral decubitus, dementia and recent hospitalization for right tibial fracture status post repair .    Clinical Impression  Patient presents with functional limitations due to deficits listed in PT problem (see list). Pt lethargic and minimally verbal throughout evaluation. Pt appeared to have pain at sacral decubitus ulcer due to limited mobility with rolling and not able to tolerate sitting EOB. Mobility assessment limited today. [facility] from [facility] and participating in therapy at SNF. Pt will need pain medication prior to PT treatment next session to be able tolerate mobility. Recommend 2 person treatment for safety. Pt would benefit from acute PT and ST SNF to improve transfers, balance and mobility so pt can ease burden of care and improve quality of life.    Follow Up Recommendations SNF;Supervision/Assistance - 24 hour    Equipment Recommendations  None recommended by PT    Recommendations for Other Services OT consult     Precautions / Restrictions Precautions Precautions: Fall Precaution Comments: Multiple wounds on Bil feet, and large sacral wound covered with bandage - getting hydrotherapy for sacral decubitus. Restrictions Weight Bearing Restrictions: No      Mobility  Bed Mobility Overal bed mobility: Needs Assistance Bed Mobility: Rolling;Sidelying to  Sit Rolling: Max assist Sidelying to sit: Max assist;HOB elevated       General bed mobility comments: Use of rails for support/assist with rolling. Rolling to R/L, Max A with trunk and BLEs. Tolerated half sitting on side with Max A to get into position and able to maintain without support for short time. Not able to obtain full sitting position due to pain in bottom?  Transfers                 General transfer comment: Transfers deferred.  Ambulation/Gait                Stairs            Wheelchair Mobility    Modified Rankin (Stroke Patients Only)       Balance                                             Pertinent Vitals/Pain Pain Assessment: Faces Faces Pain Scale: No hurt when asked if pt in pain however with attempted mobility, pt moans as if in pain- most likely bottom? Poor historian and difficult to accurately assess pain and location.    Home Living Family/patient expects to be discharged to:: Skilled nursing facility                      Prior Function Level of Independence: Needs assistance   Gait / Transfers Assistance Needed: Pt not ambulatory at SNF however able to stand last Thursday with PT. Getting around in w/c.  ADL's /  Homemaking Assistance Needed: Assist with ADLs at SNF.  Comments: Per daughter, pt was participating with therapy at [facility] and performing exercise daily.     Hand Dominance   Dominant Hand: Right    Extremity/Trunk Assessment   Upper Extremity Assessment: Generalized weakness;RUE deficits/detail;LUE deficits/detail RUE Deficits / Details: Limited AROM throughout shoulder, PROM WFL throughout all joints.     LUE Deficits / Details: Limited AROM throughout shoulder, PROM WFL throughout all joints.   Lower Extremity Assessment: Generalized weakness;RLE deficits/detail;LLE deficits/detail RLE Deficits / Details: Limited AROM throughout hip/knee/ankle due to pain/stiffness. LLE  Deficits / Details: Limited AROM throughout hip/knee/ankle due to pain/stiffness.     Communication   Communication: HOH  Cognition Arousal/Alertness: Lethargic Behavior During Therapy: Flat affect Overall Cognitive Status: Difficult to assess (Pt minimally verbal  throughout session when asked questions. )                      General Comments General comments (skin integrity, edema, etc.): Pt with multiple wounds/ulcers throughout Bil feet/ankles and large sacral decubitus ulcer covered with bandage. Discoloration/bruising RLE distal to knee.    Exercises General Exercises - Upper Extremity Shoulder Flexion: AAROM;Both;10 reps;Supine Elbow Flexion: AAROM;Both;10 reps;Supine General Exercises - Lower Extremity Ankle Circles/Pumps: AAROM;Both;10 reps;Supine Heel Slides: AAROM;Both;10 reps;Supine Hip ABduction/ADduction: AAROM;Both;10 reps;Supine      Assessment/Plan    PT Assessment Patient needs continued PT services  PT Diagnosis Acute pain;Generalized weakness   PT Problem List Decreased strength;Pain;Decreased range of motion;Decreased cognition;Decreased activity tolerance;Decreased safety awareness;Decreased balance;Decreased mobility;Decreased skin integrity  PT Treatment Interventions Balance training;Neuromuscular re-education;Gait training;Cognitive remediation;Patient/family education;Functional mobility training;Therapeutic activities;Wheelchair mobility training;Therapeutic exercise   PT Goals (Current goals can be found in the Care Plan section) Acute Rehab PT Goals Patient Stated Goal: none stated PT Goal Formulation: Patient unable to participate in goal setting Time For Goal Achievement: 09/01/14 Potential to Achieve Goals: Fair    Frequency Min 2X/week   Barriers to discharge        Co-evaluation               End of Session   Activity Tolerance: Patient limited by lethargy;Patient limited by pain Patient left: in bed;with call  bell/phone within reach;with bed alarm set;with nursing/sitter in room Nurse Communication: Mobility status;Need for lift equipment         Time: 1250-1310 PT Time Calculation (min): 20 min   Charges:   PT Evaluation $Initial PT Evaluation Tier I: 1 Procedure PT Treatments $Therapeutic Activity: 8-22 mins   PT G CodesAlvie Heidelberg:          Folan, Aunya Lemler A 08/18/2014, 3:11 PM Alvie HeidelbergShauna Folan, PT, DPT [phone]

## 2014-08-18 NOTE — Consult Note (Signed)
Patient TM:LYYT THAILYN KHALID      DOB: 07-11-1922      KPT:[id number]     Consult Note from the Palliative Medicine Team at [facility] Requested by: Dr. Broadus John     PCP: Lujean Amel, MD Reason for Consultation: [place]     Phone [phone]  Assessment of patients Current state:  I met today with Ms. Abreu's daughter, Annabell, and Annabell's husband Richardson Landry. They tell me about Ms. Sokolowki's history and Annabell wishes for her mother to be comfortable. DNR confirmed and she would not want a feeding tube. Annabell does understand and wants guidance as far as what is going to be reversible and beneficial for her mother - she is very understanding and realistic. She also understands that there does come a time where comfort care is the best we can do for Ms. Creswell and she agrees. However at this point she would like to continue PT and antibiotics and supportive treatment. After speaking with [place] RN about her exposed tendon on her right foot ulcer and the recommendation for vascular/orthopedics to evaluate for the possible need of surgical intervention. Annabell does not believe surgery would be a good thing for her mother but is open to discussion of whatever is recommended as the best treatment option for her mother. I will continue to follow and discuss options with Annabell. She says she is more often here in the afternoons around 2 pm.   Goals of Care: 1.  Code Status: DNR   2. Scope of Treatment: 1. Vital Signs: yes  2. Respiratory/Oxygen: yes 3. Nutritional Support/Tube Feeds: no 4. Antibiotics: yes 5. IVF: yes   4. Disposition: To be determined on outcomes. Annabell is hopeful she can return to rehab.    3. Symptom Management:    - Pain: Acetaminophen prn. Morphine 1 mg IV every 4 hours prn - used for wound care.   - Weakness: Continue medical managment. PT following.   - Wounds: Continue hydrotherapy as indicated. Ortho consulted for exposed tendon in  right foot   decubitus ulcer.   - Severe protein calorie malnutrition: Dietician following and making recommendations. Encouraging   prostat and Glucerna.     4. Psychosocial: Emotional support provided to patient and family.     Patient Documents Completed or Given: Document Given Completed  Advanced Directives Pkt    MOST    DNR    Gone from My Sight    Hard Choices yes     Brief HPI: 80 yo female admitted with altered mental status r/t sepsis from infected sacral decubitus and UTI. Sacral decubitus stage 3 receiving hydrotherapy with noted improvement. Right foot ulcer with exposed tendon to be evaluated by ortho. Left foot ulcer also being treated. [place] RN following. She began pureed diet with thin liquids with consistent minimal intake with severe protein calorie malnutrition. PMH significant for dementia, diabetes mellitus, stroke, atrial fibrillation (Eliquis), aortic stenosis.    ROS: Unable to assess - confused.     PMH:  Past Medical History  Diagnosis Date  . Diabetes mellitus   . Dementia   . Stroke   . Atrial fibrillation 06/06/2012    Eliquis started 8/13  . Aortic stenosis     a. Echo 6/13:  mild LVH, EF 65-70%, LV vigorous, mod AS, mean 26 mmHg, MAC, mild MS, mean 4 mmHg, mild LAE, PASP 48     PSH: Past Surgical History  Procedure Laterality Date  . Abdominal hysterectomy    .  Cholecystectomy    . Tibia im nail insertion Left 07/06/2014    Procedure: INTRAMEDULLARY (IM) NAIL TIBIAL;  Surgeon: Mcarthur Rossetti, MD;  Location: [facility];  Service: Orthopedics;  Laterality: Left;  . Tibia im nail insertion Right 07/06/2014    Procedure: INTRAMEDULLARY (IM) NAIL TIBIAL;  Surgeon: Mcarthur Rossetti, MD;  Location: [facility];  Service: Orthopedics;  Laterality: Right;   I have reviewed the [facility] and SH and  If appropriate update it with new information. Allergies  Allergen Reactions  . Penicillins Other (See Comments)    Unknown reaction   Scheduled Meds: .  ciprofloxacin  400 mg Intravenous Q12H  . collagenase   Topical Daily  . feeding supplement (GLUCERNA SHAKE)  237 mL Oral BID BM  . feeding supplement (PRO-STAT SUGAR FREE 64)  30 mL Oral TID WC  . heparin subcutaneous  5,000 Units Subcutaneous 3 times per day  . insulin aspart  0-9 Units Subcutaneous 6 times per day  . metoprolol tartrate  12.5 mg Oral BID  . mirtazapine  7.5 mg Oral QHS  . silver nitrate applicators  10 Stick Topical Once  . sodium chloride  3 mL Intravenous Q12H   Continuous Infusions:  PRN Meds:.acetaminophen, acetaminophen, dextrose, morphine injection, ondansetron (ZOFRAN) IV, ondansetron    BP 127/73  Pulse 72  Temp(Src) 98.7 F (37.1 C) (Oral)  Resp 18  Ht 5' 6"  (1.676 m)  Wt 54.885 kg (121 lb)  BMI 19.54 kg/m2  SpO2 100%   PPS: 20%   Intake/Output Summary (Last 24 hours) at 08/18/14 1501 Last data filed at 08/18/14 1456  Gross per 24 hour  Intake     70 ml  Output    950 ml  Net   -880 ml   LBM: unknown  Physical Exam:  General: NAD, frail, thin HEENT: Temporal muscle wasting, no JVD, moist mucous membranes without exudate  Chest: CTA throughout, no labored breathing, symmetric  CVS: RRR, S1 S2, murmur  Abdomen: Soft, NT, ND, +BS  Ext: BLE feet pressure ulcers with dressing intact, warm to touch  Neuro: Somnolent, awakens to loud voice, oriented to self   Labs: CBC    Component Value Date/Time   WBC 12.5* 08/18/2014 0615   RBC 3.23* 08/18/2014 0615   RBC 2.85* 07/05/2014 0955   HGB 8.8* 08/18/2014 0615   HCT 26.2* 08/18/2014 0615   PLT PLATELET CLUMPS NOTED ON SMEAR, COUNT APPEARS INCREASED 08/18/2014 0615   MCV 81.1 08/18/2014 0615   MCH 27.2 08/18/2014 0615   MCHC 33.6 08/18/2014 0615   RDW 15.3 08/18/2014 0615   LYMPHSABS 1.7 08/14/2014 2113   MONOABS 0.9 08/14/2014 2113   EOSABS 0.2 08/14/2014 2113   BASOSABS 0.0 08/14/2014 2113    BMET    Component Value Date/Time   NA 137 08/18/2014 0615   K 4.1 08/18/2014 0615    CL 97 08/18/2014 0615   CO2 27 08/18/2014 0615   GLUCOSE 140* 08/18/2014 0615   BUN 3* 08/18/2014 0615   CREATININE 0.31* 08/18/2014 0615   CALCIUM 7.6* 08/18/2014 0615   GFRNONAA >90 08/18/2014 0615   GFRAA >90 08/18/2014 0615    CMP     Component Value Date/Time   NA 137 08/18/2014 0615   K 4.1 08/18/2014 0615   CL 97 08/18/2014 0615   CO2 27 08/18/2014 0615   GLUCOSE 140* 08/18/2014 0615   BUN 3* 08/18/2014 0615   CREATININE 0.31* 08/18/2014 0615   CALCIUM 7.6* 08/18/2014 0615  PROT 5.0* 08/14/2014 2113   ALBUMIN 1.6* 08/14/2014 2113   AST 17 08/14/2014 2113   ALT 13 08/14/2014 2113   ALKPHOS 102 08/14/2014 2113   BILITOT 0.4 08/14/2014 2113   GFRNONAA >90 08/18/2014 0615   GFRAA >90 08/18/2014 0615     Time In Time Out Total Time Spent with Patient Total Overall Time  1300 1420 64mn 863m    Greater than 50%  of this time was spent counseling and coordinating care related to the above assessment and plan.  AlVinie SillNP Palliative Medicine Team Pager # [phone]-F 8a-5p) Team Phone # [phone]/Weekends)

## 2014-08-18 NOTE — Consult Note (Addendum)
WOC wound follow up Wound type: Unstageable pressure ulcer-sacrum  Measurement: 9cm x 9cm x 2.0cm with undermining at 5-10 o'clock that is 3.0cm   Wound bed: continues to have large amount of necrotic tissue. Met with PT at the bedside today during hydrotherapy, they have been successful in unroofing this ulcer. Now with larger area and depth, necrotic tissue extends over the wound bed 90% yellow/gray.  PT working to clear this. She is not ready for any changes in therapy/treatment at this time.  May be a candidate for NPWT VAC at some point once the wound is cleaner.  Drainage (amount, consistency, odor) moderate, serosanguinous  Periwound: intact  Dressing procedure/placement/frequency: Continue hydrotherapy and enzymatic debridement daily.  LE wounds are stable and dry.   [facility] team will follow along with you for weekly wound assessments.  Please notify me of any acute changes in the wounds or any new areas of concerns Mitsuru Dault Liane Comber RN,CWOCN [phone]  Addendum: paged by the bedside nurse to look at change in her right foot.  She presented at the time of admission with many areas over the feet that are necrotic with stable dry eschar. The left dorsal foot had a stable eschar that is now open proximally and has purulent drainage.  There is undermining and exposed tendon in the base.  I realize this patient may not be a surgical candidate however I feel vascular needs to take a look at the site to determine if surgical intervention is warranted.  Annabell arrived as I was leaving and I was able to give her an update on the sacral ulcer and the change with the right foot wound.   Marica Otter RN,CWOCN [phone]

## 2014-08-18 NOTE — Progress Notes (Signed)
Physical Therapy Wound Treatment Patient Details  Name: Lorraine Tran MRN: [id number] Date of Birth: 1922-02-15  Today's Date: 08/18/2014 Time: 7062-3762 Time Calculation (min): 45 min  Subjective  Patient and Family Stated Goals: pt unable  Pain Score:    Wound Assessment  Pressure Ulcer 08/11/14 Stage II -  Partial thickness loss of dermis presenting as a shallow open ulcer with a red, pink wound bed without slough. wound open, drainage present (Active)  Dressing Clean;Dry;Intact 08/17/2014  7:37 PM  Margins Unattached edges (unapproximated) 08/11/2014 10:28 PM  Drainage Amount Moderate 08/11/2014 10:28 PM  Drainage Description Serosanguineous;Odor 08/11/2014 10:28 PM     Pressure Ulcer 08/11/14 Stage II -  Partial thickness loss of dermis presenting as a shallow open ulcer with a red, pink wound bed without slough. open pressure ulcer to bilat heels (Active)  Dressing Type None 08/17/2014  7:37 PM  State of Healing Eschar 08/13/2014  8:45 PM  Site / Wound Assessment Bleeding;Brown 08/11/2014 10:29 PM  Drainage Amount None 08/12/2014  7:50 PM  Drainage Description Serosanguineous;Odor 08/11/2014 10:29 PM  Treatment Other (Comment) 08/13/2014  2:40 AM     Pressure Ulcer 08/11/14 Stage II -  Partial thickness loss of dermis presenting as a shallow open ulcer with a red, pink wound bed without slough. open pressure ulcer to back of left ankle (Active)  Dressing Type None 08/17/2014  7:37 PM  State of Healing Eschar 08/13/2014  8:45 PM  Site / Wound Assessment Bleeding;Brown 08/11/2014 10:30 PM  Drainage Amount None 08/12/2014  7:50 PM  Drainage Description Serosanguineous 08/11/2014 10:30 PM  Treatment Other (Comment) 08/14/2014  3:50 AM     Pressure Ulcer 08/11/14 Deep Tissue Injury - Purple or maroon localized area of discolored intact skin or blood-filled blister due to damage of underlying soft tissue from pressure and/or shear. pressure ulcers present to tops of bilat  feet. (Active)  Dressing Type None 08/17/2014  7:37 PM  State of Healing Eschar 08/13/2014  8:45 PM  Site / Wound Assessment Black;Bleeding 08/13/2014  8:45 PM  Drainage Amount Minimal 08/12/2014  7:50 PM  Drainage Description Sanguineous 08/12/2014  7:50 PM  Treatment Other (Comment) 08/14/2014  3:50 AM     Pressure Ulcer 08/12/14 Unstageable - Full thickness tissue loss in which the base of the ulcer is covered by slough (yellow, tan, gray, green or brown) and/or eschar (tan, brown or black) in the wound bed. yellow thick slough (Active)  Dressing Type ABD;Gauze (Comment);Moist to dry 08/18/2014 10:00 AM  Dressing Intact 08/18/2014 10:00 AM  Dressing Change Frequency Daily 08/18/2014 10:00 AM  State of Healing Eschar 08/18/2014 10:00 AM  Site / Wound Assessment Yellow;Black;Pink;Pale 08/18/2014 10:00 AM  % Wound base Red or Granulating 20% 08/18/2014 10:00 AM  % Wound base Yellow 80% 08/18/2014 10:00 AM  % Wound base Black 0% 08/18/2014 10:00 AM  % Wound base Other (Comment) 0% 08/18/2014 10:00 AM  Peri-wound Assessment Intact 08/18/2014 10:00 AM  Wound Length (cm) 10.5 cm 08/14/2014 10:00 AM  Wound Width (cm) 10 cm 08/14/2014 10:00 AM  Wound Depth (cm) 3 cm 08/14/2014 10:00 AM  Margins Unattached edges (unapproximated) 08/18/2014 10:00 AM  Drainage Amount Minimal 08/18/2014 10:00 AM  Drainage Description Serosanguineous;Other (Comment) 08/18/2014 10:00 AM  Treatment Cleansed;Debridement (Selective);Hydrotherapy (Pulse lavage);Packing (Saline gauze) 08/18/2014 10:00 AM     Incision (Closed) 07/06/14 Leg Left (Active)     Incision (Closed) 07/06/14 Leg Right (Active)   Hydrotherapy Pulsed lavage therapy - wound location: sacrum Pulsed Lavage  with Suction (psi): 8 psi Pulsed Lavage with Suction - Normal Saline Used: 1000 mL Pulsed Lavage Tip: Tip with splash shield Selective Debridement Selective Debridement - Location: sacrum Selective Debridement - Tools Used:  Forceps;Scalpel;Scissors Selective Debridement - Tissue Removed: yellow eschar, necrotic tissue   Wound Assessment and Plan  Wound Therapy - Assess/Plan/Recommendations Wound Therapy - Clinical Statement: pt can benefit from PLS to cleanse, soften tissues to allow for extensive selective debridement to get to healthy tissues. Wound Therapy - Functional Problem List: pt appears to be undergo infrequent mobility and cognitively doesn't have the motivation or awareness to move and get pressure relief. Factors Delaying/Impairing Wound Healing: Immobility;Incontinence Hydrotherapy Plan: Debridement;Dressing change;Patient/family education;Pulsatile lavage with suction Wound Therapy - Frequency: 6X / week Wound Therapy - Current Recommendations: PT Wound Therapy - Follow Up Recommendations: Skilled nursing facility Wound Plan: see above  Wound Therapy Goals- Improve the function of patient's integumentary system by progressing the wound(s) through the phases of wound healing (inflammation - proliferation - remodeling) by: Decrease Necrotic Tissue to: 25% Decrease Necrotic Tissue - Progress: Progressing toward goal Increase Granulation Tissue to: 75% (including any viable CT) Increase Granulation Tissue - Progress: Progressing toward goal Improve Drainage Characteristics: Min;Serous Improve Drainage Characteristics - Progress: Progressing toward goal Goals/treatment plan/discharge plan were made with and agreed upon by patient/family: No, Patient unable to participate in goals/treatment/discharge plan and family unavailable Time For Goal Achievement: 7 days Wound Therapy - Potential for Goals: Good  Goals will be updated until maximal potential achieved or discharge criteria met.  Discharge criteria: when goals achieved, discharge from hospital, MD decision/surgical intervention, no progress towards goals, refusal/missing three consecutive treatments without notification or medical reason.  GP      Charlean Carneal, Tessie Fass 08/18/2014, 10:54 AM  08/18/2014  Donnella Sham, [place] [phone]  (pager)

## 2014-08-18 NOTE — Clinical Social Work Note (Addendum)
CSW contacted Bjorn Loserhonda, Insurance claims handleradmissions director at [facility] regarding patient. Per Bjorn Loserhonda, they will take patient back, but thinks family may want [facility] as they [place] in [place]. CSW talked by phone with daughter, [place] Domnnabell Vanalstyne regarding patient and placement and she does not want patient to return to [facility] and would prefer [facility] as they are 10 minutes from facility. [facility] and [facility] as they responded no. [facility] not sure of reason, but indicated that it may be that patient needs more care than they can provide, she would have to review clinicals to be sure.  CSW attempted to reach daughter again and message left.  CSW spoke with daughter regarding [facility] and gave her bed offers. Daughter encouraged to check out facilities so that she can made a SNF choice by or before patient ready for discharge.   Genelle BalVanessa Yolando Gillum, MSW, LCSW [phone]

## 2014-08-19 DIAGNOSIS — G9340 Encephalopathy, unspecified: Secondary | ICD-10-CM

## 2014-08-19 DIAGNOSIS — E43 Unspecified severe protein-calorie malnutrition: Secondary | ICD-10-CM | POA: Insufficient documentation

## 2014-08-19 DIAGNOSIS — A419 Sepsis, unspecified organism: Secondary | ICD-10-CM

## 2014-08-19 LAB — GLUCOSE, CAPILLARY
GLUCOSE-CAPILLARY: 208 mg/dL — AB (ref 70–99)
Glucose-Capillary: 234 mg/dL — ABNORMAL HIGH (ref 70–99)
Glucose-Capillary: 172 mg/dL — ABNORMAL HIGH (ref 70–99)
Glucose-Capillary: 155 mg/dL — ABNORMAL HIGH (ref 70–99)

## 2014-08-19 MED ORDER — SILVER SULFADIAZINE 1 % EX CREA
TOPICAL_CREAM | Freq: Every day | CUTANEOUS | Status: DC
  Administered 2014-08-19: 1 via TOPICAL
  Administered 2014-08-20 – 2014-08-21 (×2): via TOPICAL
  Filled 2014-08-19: qty 85

## 2014-08-19 MED ORDER — VANCOMYCIN HCL IN DEXTROSE 750-5 MG/150ML-% IV SOLN
750.0000 mg | INTRAVENOUS | Status: DC
  Administered 2014-08-19 – 2014-08-21 (×3): 750 mg via INTRAVENOUS
  Filled 2014-08-19 (×4): qty 150

## 2014-08-19 NOTE — Progress Notes (Signed)
UR completed 

## 2014-08-19 NOTE — Progress Notes (Signed)
Inpatient Diabetes Program Recommendations  AACE/ADA: New Consensus Statement on Inpatient Glycemic Control (2013)  Target Ranges:  Prepandial:   less than 140 mg/dL      Peak postprandial:   less than 180 mg/dL (1-2 hours)      Critically ill patients:  140 - 180 mg/dL  Results for Donata ClaySOKOLOWSKI, Bonnee M (MRN [id number]) as of 08/19/2014 12:01  Ref. Range 08/18/2014 17:43 08/18/2014 21:30 08/19/2014 07:40  Glucose-Capillary Latest Range: 70-99 mg/dL 409176 (H) 811251 (H) 914234 (H)   Inpatient Diabetes Program Recommendations Insulin - Basal: Start Lantus at least 1/2 home dose   Thank you  Piedad ClimesGina Albeiro Trompeter BSN, RN,CDE Inpatient Diabetes Coordinator [phone] (team pager)

## 2014-08-19 NOTE — Progress Notes (Signed)
Speech Language Pathology Treatment: Dysphagia  Patient Details Name: Lorraine Tran MRN: [id number] DOB: 1922/06/06 Today's Date: 08/19/2014 Time: [id number] SLP Time Calculation (min): 8 min  Assessment / Plan / Recommendation Clinical Impression  Skilled treatment session focused on dysphagia goals. Upon arrival, patient was asleep in bed and needed Max A to wake up and participate in therapy. Student facilitated session by providing Mod A multimodal cues for utilization of swallowing compensatory strategies during trials of Dys. 1 textures with thin liquids without overt s/s of aspiration. Patient required hand over hand assist and demonstrated tongue pumping in an attempt to initiate swallow; however, overall swallow initiation was delayed. Patient with decreased frustration tolerance and verbalized desire for student and SLP to leave. Recommend continue current diet of Dys. 1 textures with thin liquids and full supervision. Continue with current plan of care.    HPI HPI: 80 year old female admitted 08/11/14 due to AMS from [facility]. PMH significant for AFib, DM, dementia, Bilateral CVA. CXR and CT unremarkable. BSE administered 08/15/14 with recommendations for Dys. 1 diet with thin liquids.    Pertinent Vitals Pain Assessment:  (No overt s/s of pain)  SLP Plan  Continue with current plan of care    Recommendations Diet recommendations: Dysphagia 1 (puree);Thin liquid Liquids provided via: Cup;Straw Medication Administration: Whole meds with puree Supervision: Staff to assist with self feeding;Full supervision/cueing for compensatory strategies Compensations: Slow rate;Small sips/bites Postural Changes and/or Swallow Maneuvers: Seated upright 90 degrees;Upright 30-60 min after meal              Oral Care Recommendations: Oral care BID Follow up Recommendations: 24 hour supervision/assistance;Skilled Nursing facility Plan: Continue with current plan of care    GO      Emillie Chasen 08/19/2014, 4:10 PM

## 2014-08-19 NOTE — Consult Note (Signed)
Reason for Consult: Gangrenous ulcers bilateral foot and ankle. Referring Physician: Dr. Ovidio Hanger Lorraine Tran is an 80 y.o. female.  HPI: Patient is a 80 year old woman with diabetes severe peripheral vascular disease status post intramedullary nailing for bilateral tibial fractures who is seen for evaluation for gangrenous ulcers of bilateral ankles bilateral feet.  Past Medical History  Diagnosis Date  . Diabetes mellitus   . Dementia   . Stroke   . Atrial fibrillation 06/06/2012    Eliquis started 8/13  . Aortic stenosis     a. Echo 6/13:  mild LVH, EF 65-70%, LV vigorous, mod AS, mean 26 mmHg, MAC, mild MS, mean 4 mmHg, mild LAE, PASP 48    Past Surgical History  Procedure Laterality Date  . Abdominal hysterectomy    . Cholecystectomy    . Tibia im nail insertion Left 07/06/2014    Procedure: INTRAMEDULLARY (IM) NAIL TIBIAL;  Surgeon: Mcarthur Rossetti, MD;  Location: [facility];  Service: Orthopedics;  Laterality: Left;  . Tibia im nail insertion Right 07/06/2014    Procedure: INTRAMEDULLARY (IM) NAIL TIBIAL;  Surgeon: Mcarthur Rossetti, MD;  Location: [facility];  Service: Orthopedics;  Laterality: Right;    No family history on file.  Social History:  reports that she has never smoked. She has never used smokeless tobacco. She reports that she does not drink alcohol or use illicit drugs.  Allergies:  Allergies  Allergen Reactions  . Penicillins Other (See Comments)    Unknown reaction    Medications: I have reviewed the patient's current medications.  Results for orders placed during the hospital encounter of 08/11/14 (from the past 48 hour(s))  GLUCOSE, CAPILLARY     Status: Abnormal   Collection Time    08/17/14  9:01 PM      Result Value Ref Range   Glucose-Capillary 145 (*) 70 - 99 mg/dL  GLUCOSE, CAPILLARY     Status: Abnormal   Collection Time    08/18/14  1:17 AM      Result Value Ref Range   Glucose-Capillary 130 (*) 70 - 99 mg/dL  GLUCOSE,  CAPILLARY     Status: Abnormal   Collection Time    08/18/14  3:55 AM      Result Value Ref Range   Glucose-Capillary 143 (*) 70 - 99 mg/dL  CBC     Status: Abnormal   Collection Time    08/18/14  6:15 AM      Result Value Ref Range   WBC 12.5 (*) 4.0 - 10.5 K/uL   Comment: WHITE COUNT CONFIRMED ON SMEAR   RBC 3.23 (*) 3.87 - 5.11 MIL/uL   Hemoglobin 8.8 (*) 12.0 - 15.0 g/dL   HCT 26.2 (*) 36.0 - 46.0 %   MCV 81.1  78.0 - 100.0 fL   MCH 27.2  26.0 - 34.0 pg   MCHC 33.6  30.0 - 36.0 g/dL   RDW 15.3  11.5 - 15.5 %   Platelets    150 - 400 K/uL   Value: PLATELET CLUMPS NOTED ON SMEAR, COUNT APPEARS INCREASED  BASIC METABOLIC PANEL     Status: Abnormal   Collection Time    08/18/14  6:15 AM      Result Value Ref Range   Sodium 137  137 - 147 mEq/L   Potassium 4.1  3.7 - 5.3 mEq/L   Comment: SLIGHT HEMOLYSIS   Chloride 97  96 - 112 mEq/L   CO2 27  19 - 32  mEq/L   Glucose, Bld 140 (*) 70 - 99 mg/dL   BUN 3 (*) 6 - 23 mg/dL   Creatinine, Ser 0.31 (*) 0.50 - 1.10 mg/dL   Calcium 7.6 (*) 8.4 - 10.5 mg/dL   GFR calc non Af Amer >90  >90 mL/min   GFR calc Af Amer >90  >90 mL/min   Comment: (NOTE)     The eGFR has been calculated using the CKD EPI equation.     This calculation has not been validated in all clinical situations.     eGFR's persistently <90 mL/min signify possible Chronic Kidney     Disease.   Anion gap 13  5 - 15  GLUCOSE, CAPILLARY     Status: Abnormal   Collection Time    08/18/14  7:52 AM      Result Value Ref Range   Glucose-Capillary 132 (*) 70 - 99 mg/dL  GLUCOSE, CAPILLARY     Status: Abnormal   Collection Time    08/18/14 11:52 AM      Result Value Ref Range   Glucose-Capillary 159 (*) 70 - 99 mg/dL  GLUCOSE, CAPILLARY     Status: Abnormal   Collection Time    08/18/14  5:43 PM      Result Value Ref Range   Glucose-Capillary 176 (*) 70 - 99 mg/dL  GLUCOSE, CAPILLARY     Status: Abnormal   Collection Time    08/18/14  9:30 PM      Result Value Ref  Range   Glucose-Capillary 251 (*) 70 - 99 mg/dL  GLUCOSE, CAPILLARY     Status: Abnormal   Collection Time    08/19/14  7:40 AM      Result Value Ref Range   Glucose-Capillary 234 (*) 70 - 99 mg/dL  GLUCOSE, CAPILLARY     Status: Abnormal   Collection Time    08/19/14 11:31 AM      Result Value Ref Range   Glucose-Capillary 208 (*) 70 - 99 mg/dL  GLUCOSE, CAPILLARY     Status: Abnormal   Collection Time    08/19/14  5:06 PM      Result Value Ref Range   Glucose-Capillary 172 (*) 70 - 99 mg/dL    No results found.  Review of Systems  All other systems reviewed and are negative.  Blood pressure 141/27, pulse 78, temperature 97.6 F (36.4 C), temperature source Oral, resp. rate 17, height 5' 6"  (1.676 m), weight 54.885 kg (121 lb), SpO2 99.00%. Physical Exam On examination patient is noncommunicative. Examination the left lower extremity she has thin atrophic skin on the left leg. She has gangrenous ulcers dorsally on the left foot and on the left heel. The skin is thin and atrophic. There is no ascending cellulitis bilaterally. On the right foot she has gangrenous ulcers with some mild purulent drainage this was decompressed and debrided the extensor tendons are exposed. She has a gangrenous ulcer on the heel with gangrenous ulcers dorsally on the right foot and ankle. There is no ascending cellulitis. The skin is thin and atrophic. There are no palpable pulses on the left or right foot.  Patient does not respond to communication or to decompression of the purulent ulcer. Patient has multiple medical problems including sacral decubitus ulcers. Assessment/Plan: Assessment: Ischemic gangrenous ulcers bilateral feet and a woman with multiple medical problems and advanced dementia.  Plan would recommend comfort care, continue with the protective boots would add Silvadene dressing changes with daily cleansing to  the feet. Patient is not a good candidate for surgery and the only surgical  option for these ulcers would be an above-the-knee amputation bilaterally. I will follow up as needed.  DUDA,MARCUS V 08/19/2014, 6:29 PM

## 2014-08-19 NOTE — Progress Notes (Signed)
ANTIBIOTIC CONSULT NOTE   Pharmacy Consult for Vancomycin Indication: wound infection  Allergies  Allergen Reactions  . Penicillins Other (See Comments)    Unknown reaction    Patient Measurements: Height: 5\' 6"  (167.6 cm) Weight: 121 lb (54.885 kg) IBW/kg (Calculated) : 59.3   Labs:  Recent Labs  08/18/14 0615  WBC 12.5*  HGB 8.8*  PLT PLATELET CLUMPS NOTED ON SMEAR, COUNT APPEARS INCREASED  CREATININE 0.31*   Estimated Creatinine Clearance: 40.5 ml/min (by C-G formula based on Cr of 0.31). No results found for this basename: VANCOTROUGH, Leodis BinetVANCOPEAK, VANCORANDOM, GENTTROUGH, GENTPEAK, GENTRANDOM, TOBRATROUGH, TOBRAPEAK, TOBRARND, AMIKACINPEAK, AMIKACINTROU, AMIKACIN,  in the last 72 hours   Microbiology: Recent Results (from the past 720 hour(s))  URINE CULTURE     Status: None   Collection Time    08/11/14 10:46 PM      Result Value Ref Range Status   Specimen Description URINE, CATHETERIZED   Final   Special Requests NONE   Final   Culture  Setup Time     Final   Value: 08/12/2014 04:15     Performed at [facility]   Colony Count     Final   Value: >=100,000 COLONIES/ML     Performed at [facility]   Culture     Final   Value: ESCHERICHIA COLI     Performed at [facility]   Report Status 08/15/2014 FINAL   Final   Organism ID, Bacteria ESCHERICHIA COLI   Final  CULTURE, BLOOD (ROUTINE X 2)     Status: None   Collection Time    08/11/14 10:55 PM      Result Value Ref Range Status   Specimen Description BLOOD RIGHT ARM   Final   Special Requests BOTTLES DRAWN AEROBIC AND ANAEROBIC 5CC EA   Final   Culture  Setup Time     Final   Value: 08/12/2014 04:21     Performed at [facility]   Culture     Final   Value: ENTEROBACTER CLOACAE     Note: Gram Stain Report Called to,Read Back By and Verified With: AMANDA PETTIFORD ON 08/12/2014 AT 9:52P BY WILEJ     Performed at [facility]   Report Status 08/14/2014 FINAL    Final   Organism ID, Bacteria ENTEROBACTER CLOACAE   Final  CULTURE, BLOOD (ROUTINE X 2)     Status: None   Collection Time    08/11/14 11:05 PM      Result Value Ref Range Status   Specimen Description BLOOD RIGHT HAND   Final   Special Requests BOTTLES DRAWN AEROBIC AND ANAEROBIC 4CC EA   Final   Culture  Setup Time     Final   Value: 08/12/2014 04:21     Performed at [facility]   Culture     Final   Value: ENTEROBACTER CLOACAE     Note: SUSCEPTIBILITIES PERFORMED ON PREVIOUS CULTURE WITHIN THE LAST 5 DAYS.     Note: Gram Stain Report Called to,Read Back By and Verified With: AMANDA PETTIFORD ON 08/12/2014 AT 9:52P BY Serafina MitchellWILEJ     Performed at [facility]   Report Status 08/14/2014 FINAL   Final  WOUND CULTURE     Status: None   Collection Time    08/12/14  8:02 AM      Result Value Ref Range Status   Specimen Description WOUND   Final   Special Requests SACRAL  Final   Gram Stain     Final   Value: NO WBC SEEN     NO SQUAMOUS EPITHELIAL CELLS SEEN     MODERATE GRAM POSITIVE COCCI     IN PAIRS IN CLUSTERS FEW GRAM NEGATIVE RODS     Performed at [facility]   Culture     Final   Value: MULTIPLE ORGANISMS PRESENT, NONE PREDOMINANT     Note: NO STAPHYLOCOCCUS AUREUS ISOLATED NO GROUP A STREP (S.PYOGENES) ISOLATED     Performed at [facility]   Report Status 08/14/2014 FINAL   Final  MRSA PCR SCREENING     Status: None   Collection Time    08/12/14  3:35 PM      Result Value Ref Range Status   MRSA by PCR NEGATIVE  NEGATIVE Final   Comment:            The GeneXpert MRSA Assay (FDA     approved for NASAL specimens     only), is one component of a     comprehensive MRSA colonization     surveillance program. It is not     intended to diagnose MRSA     infection nor to guide or     monitor treatment for     MRSA infections.    Medical History: Past Medical History  Diagnosis Date  . Diabetes mellitus   . Dementia   . Stroke   .  Atrial fibrillation 06/06/2012    Eliquis started 8/13  . Aortic stenosis     a. Echo 6/13:  mild LVH, EF 65-70%, LV vigorous, mod AS, mean 26 mmHg, MAC, mild MS, mean 4 mmHg, mild LAE, PASP 48     Assessment: Patient known to pharmacy from Cipro dosing for enterobacter sepsis Now restarting vancomycin for wound infection Previously on vancomycin closer to admission  Goal of Therapy:  Vancomycin trough level 10-15 mcg/ml  Plan:  Vancomycin 750 mg iv Q 24 hours Continue to follow  Thank you. Lorraine Tran, PharmD [phone]  08/19/2014,3:43 PM

## 2014-08-19 NOTE — Progress Notes (Signed)
[place] TEAM 1 Transfer 10/24  Lorraine Tran XBJ:[id number] DOB: Jan 26, 1922 DOA: 08/11/2014 PCP: Darrow BussingKOIRALA,DIBAS, MD  Admit HPI / Brief Narrative: 80 year old female with history of A. fib on Eliquis, type 2 diabetes mellitus with A1c of 6.7, recently started on insulin, hx of stroke, sacral decubitus, dementia, recent hospitalization for right tibial fracture status post repair and discharged to skilled nursing facility was sent to the ED with altered mental status. Patient is confused and nonverbal, unable to provide any history. Patient reportedly was confused, lethargic and poorly responsive in the nursing home. Upon arrival to the ED she had a low-grade temperature of 99.8 Fahrenheit, tachycardic to 128, tachypnea of the 20 with normal blood pressure. O2 sat was maintained.  Blood work done showed WBC of 23.7 with UA was suggestive of UTI. She also was found to have foul-smelling sacral decubitus ulcer. She was initially treated with IV vancomycin and cefepime for sepsis and hospitalist admission requested to step down unit. Blood cultures and urine cultures sent from the ED.  Chest x-ray and head CT unremarkable.   At baseline per daughter she has mild dementia but seems quite oriented. She saw her 1 day prior to admission and was at her baseline. She was participating with PT at SNF . She has been eating well as well.  As per daughter , patient has new bedsores in the back starting only 3 weeks back. Also has new eschars in b/l foot which daughter thinks is from the cast/ dressing.  Urine cultures drawn on 08/11/2014 grew Enterobacter and Escherichia coli, both organisms being susceptible to ciprofloxacin. On 08/15/2014 ceftriaxone was discontinued as she was started on Cipro 400 mg IV twice a day. Given clinical stability she was transferred out of the step down unit to the floor. Speech pathology evaluated patient on 08/15/2014 recommended dysphasia 1 thin liquid diet. Currently getting  hydrotherapy and Palliative meeting pending   HPI/Subjective: No complaints, mumbles few words, no events overnight  Assessment/Plan: Severe sepsis - gram negative rod bacteremia  -Likely a combination of UTI and infected sacral decubitus  -Enterobacter bacteremia on 08/11/2014  -Ceftriaxone was discontinued on 08/15/2014 started on ciprofloxacin 400 mg IV twice a day, urine cultures growing Enterobacter and Escherichia coli, both organisms susceptible to ciprofloxacin, will RX for 2 weeks from 10/23, till 11/6 -Wound care/Gen Surgery recommended hydrotherapy for sacral ulcer   -per PT will need 3-634more days of Hydrotherapy  Decubitus ulcer of sacral region, stage 3  -Gen Surgery/Wound Care recommended hydrotherapy   -continue hydrotherapy per PT  Urinary tract infection (Enterobacter Cloacae.)  -As mentioned above, urine cultures growing Enterobacter and Escherichia coli -Patient changed to ciprofloxacin 400 mg IV twice a day on 08/15/2014  Dementia -stable  Hypertension  -stable now, Po metoprolol  Acute toxic metabolic encephalopathy  -Patient has mild underlying dementia which is worsened with sepsis  -Head CT negative  -stable, likely at baseline   Metabolic acidosis / lactic acidosis  - resolved, continue to monitor  Hypokalemia -replace  Moderate Aortic stenosis  DM type 2  -Cont sensitive SSI - CBG currently reasonably controlled   Bilateral foot wounds  -due to DM and pressure wounds - Wound Care following - no plan for further workup at this time - plain xrays w/o evidence of osteo at this time  - patient has discharge from right foot dorsum, will consult orthopedics, and add vancomycin.  Atrial fibrillation  -Currently in sinus rhythm  - restarted metoprolol at 12.5 mg by mouth  twice a day - not on Anticoagulation   Iron deficiency Anemia  -Continue iron supplements when able to tolerate oral intake   Acute kidney injury  -Due to  dehydration/sepsis -resolved with hydration  Protein calorie malnutrition  -Started pure diet  GLOBAL: GIVEN Advanced age, Dementia, Sacral decubitus wounds, bed bound status, PC Malnutrition, DM, discussed poor prognosis with daughter on 10/25 and recommended Palliative eval. She is agreeable to a Palliative meeting and seems very reasonable -Palliative consult requested on 10/25   Code Status: DNR Family Communication:none at bedside,  Disposition Plan: Likely d/c to SNF when medically stable   Consultants: Dr. Abigail Miyamoto (surgery) RN Melody Eliberto Ivory (wound care)  Procedure/Significant Events: 9/13 echocardiogram Left ventricle:mild concentric hypertrophy. LVEF= 65% to 70%. -(grade 1 diastolic dysfunction). Doppler parameters - Aortic valve: moderate stenosis.  - Pulmonary arteries: PA peak pressure: 60 mm Hg (S).     Culture 10/19 blood right hand/arm positive Enterobacter Cloacae. 10/20 wound organisms present 10/20 MRSA by PCR negative   Antibiotics: Cefepime 10/20>> stopped 10/22 Vancomycin 10/20>> stopped 10/22 Ceftriaxone 10/22>> 10/232 Ciprofloxacin 10/23>>   DVT prophylaxis: Subcutaneous heparin   Devices NA   LINES / TUBES:      Continuous Infusions:    Objective: VITAL SIGNS: Temp: 97.8 F (36.6 C) (10/27 1049) Temp Source: Oral (10/27 1049) BP: 114/55 mmHg (10/27 1049) Pulse Rate: 68 (10/27 1049) SPO2; FIO2:   Intake/Output Summary (Last 24 hours) at 08/19/14 1557 Last data filed at 08/19/14 1051  Gross per 24 hour  Intake     10 ml  Output    655 ml  Net   -645 ml     Exam: General: A./O. X to self only, NAD, follows some commands, chronically ill appearing Lungs: Clear to auscultation bilaterally without wheezes or crackles Cardiovascular: Regular rate and rhythm, systolic ejection murmur, no gallop or rub normal S1 and S2 Abdomen: Nontender, nondistended, soft, bowel sounds positive, no rebound, no ascites, no  appreciable mass Extremities: Bilateral eschar on the Rt>Lt , bleeding, tender to palpation, negative pus, negative warmth. Bilateral lower extremity blistering Sacral decub ulcers with dressing   Data Reviewed: Basic Metabolic Panel:  Recent Labs Lab 08/14/14 0308 08/14/14 1441 08/14/14 2113 08/16/14 0438 08/18/14 0615  NA 140  --  135* 137 137  K 3.1* 3.8 3.6* 3.0* 4.1  CL 103  --  98 100 97  CO2 27  --  22 22 27   GLUCOSE 123*  --  181* 146* 140*  BUN 10  --  8 5* 3*  CREATININE 0.31*  --  0.25* 0.23* 0.31*  CALCIUM 7.4*  --  7.4* 7.4* 7.6*  MG  --  1.4* 2.2  --   --    Liver Function Tests:  Recent Labs Lab 08/14/14 0308 08/14/14 2113  AST 13 17  ALT 12 13  ALKPHOS 86 102  BILITOT 0.5 0.4  PROT 4.8* 5.0*  ALBUMIN 1.4* 1.6*   No results found for this basename: LIPASE, AMYLASE,  in the last 168 hours No results found for this basename: AMMONIA,  in the last 168 hours CBC:  Recent Labs Lab 08/14/14 0308 08/14/14 2113 08/16/14 0438 08/18/14 0615  WBC 12.1* 14.8* 13.6* 12.5*  NEUTROABS  --  12.1*  --   --   HGB 9.1* 8.8* 8.4* 8.8*  HCT 27.9* 26.9* 25.3* 26.2*  MCV 83.0 83.0 83.0 81.1  PLT 684* 638* 579* PLATELET CLUMPS NOTED ON SMEAR, COUNT APPEARS INCREASED   Cardiac Enzymes:  No results found for this basename: CKTOTAL, CKMB, CKMBINDEX, TROPONINI,  in the last 168 hours BNP (last 3 results) No results found for this basename: PROBNP,  in the last 8760 hours CBG:  Recent Labs Lab 08/18/14 1152 08/18/14 1743 08/18/14 2130 08/19/14 0740 08/19/14 1131  GLUCAP 159* 176* 251* 234* 208*    Recent Results (from the past 240 hour(s))  URINE CULTURE     Status: None   Collection Time    08/11/14 10:46 PM      Result Value Ref Range Status   Specimen Description URINE, CATHETERIZED   Final   Special Requests NONE   Final   Culture  Setup Time     Final   Value: 08/12/2014 04:15     Performed at [facility] Count     Final    Value: >=100,000 COLONIES/ML     Performed at [facility]   Culture     Final   Value: ESCHERICHIA COLI     Performed at [facility]   Report Status 08/15/2014 FINAL   Final   Organism ID, Bacteria ESCHERICHIA COLI   Final  CULTURE, BLOOD (ROUTINE X 2)     Status: None   Collection Time    08/11/14 10:55 PM      Result Value Ref Range Status   Specimen Description BLOOD RIGHT ARM   Final   Special Requests BOTTLES DRAWN AEROBIC AND ANAEROBIC 5CC EA   Final   Culture  Setup Time     Final   Value: 08/12/2014 04:21     Performed at [facility]   Culture     Final   Value: ENTEROBACTER CLOACAE     Note: Gram Stain Report Called to,Read Back By and Verified With: AMANDA PETTIFORD ON 08/12/2014 AT 9:52P BY WILEJ     Performed at [facility]   Report Status 08/14/2014 FINAL   Final   Organism ID, Bacteria ENTEROBACTER CLOACAE   Final  CULTURE, BLOOD (ROUTINE X 2)     Status: None   Collection Time    08/11/14 11:05 PM      Result Value Ref Range Status   Specimen Description BLOOD RIGHT HAND   Final   Special Requests BOTTLES DRAWN AEROBIC AND ANAEROBIC 4CC EA   Final   Culture  Setup Time     Final   Value: 08/12/2014 04:21     Performed at [facility]   Culture     Final   Value: ENTEROBACTER CLOACAE     Note: SUSCEPTIBILITIES PERFORMED ON PREVIOUS CULTURE WITHIN THE LAST 5 DAYS.     Note: Gram Stain Report Called to,Read Back By and Verified With: AMANDA PETTIFORD ON 08/12/2014 AT 9:52P BY WILEJ     Performed at [facility]   Report Status 08/14/2014 FINAL   Final  WOUND CULTURE     Status: None   Collection Time    08/12/14  8:02 AM      Result Value Ref Range Status   Specimen Description WOUND   Final   Special Requests SACRAL   Final   Gram Stain     Final   Value: NO WBC SEEN     NO SQUAMOUS EPITHELIAL CELLS SEEN     MODERATE GRAM POSITIVE COCCI     IN PAIRS IN CLUSTERS FEW GRAM NEGATIVE RODS     Performed at  [facility]  Final   Value: MULTIPLE ORGANISMS PRESENT, NONE PREDOMINANT     Note: NO STAPHYLOCOCCUS AUREUS ISOLATED NO GROUP A STREP (S.PYOGENES) ISOLATED     Performed at [facility]   Report Status 08/14/2014 FINAL   Final  MRSA PCR SCREENING     Status: None   Collection Time    08/12/14  3:35 PM      Result Value Ref Range Status   MRSA by PCR NEGATIVE  NEGATIVE Final   Comment:            The GeneXpert MRSA Assay (FDA     approved for NASAL specimens     only), is one component of a     comprehensive MRSA colonization     surveillance program. It is not     intended to diagnose MRSA     infection nor to guide or     monitor treatment for     MRSA infections.     Studies:  Recent x-ray studies have been reviewed in detail by the Attending Physician  Scheduled Meds:  Scheduled Meds: . ciprofloxacin  400 mg Intravenous Q12H  . collagenase   Topical Daily  . feeding supplement (GLUCERNA SHAKE)  237 mL Oral BID BM  . feeding supplement (PRO-STAT SUGAR FREE 64)  30 mL Oral TID WC  . heparin subcutaneous  5,000 Units Subcutaneous 3 times per day  . insulin aspart  0-9 Units Subcutaneous TID AC & HS  . metoprolol tartrate  12.5 mg Oral BID  . mirtazapine  7.5 mg Oral QHS  . silver nitrate applicators  10 Stick Topical Once  . sodium chloride  3 mL Intravenous Q12H  . vancomycin  750 mg Intravenous Q24H    Time spent on care of this patient: 35 mins   Stasha Naraine , MD    If 7PM-7AM, please contact night-coverage [email or web address] Password [email or web address] 08/19/2014, 3:57 PM   LOS: 8 days

## 2014-08-19 NOTE — Progress Notes (Signed)
Progress Note from the Palliative Medicine Team at [facility]  Subjective: I spoke with daughter Annabell who understands that her mother is not doing well and that she will likely pass soon. I fear she may believe she has more time left than she likely does. Annabell is open to comfort and hospice options however, at this point she feels like her mother "deserves a chance." She would like to continue IV fluids, antibiotics, therapies as indicated. Annabell's father died in January and Ms. Zaremba's husband is 78 yo and Annabell is caring for him as well. I encouraged Annabell to consider if Ms. Wenig continues not to eat she will continue to decline regardless of our interventions. I will continue to work with Annabell.     Objective: Allergies  Allergen Reactions  . Penicillins Other (See Comments)    Unknown reaction   Scheduled Meds: . ciprofloxacin  400 mg Intravenous Q12H  . collagenase   Topical Daily  . feeding supplement (GLUCERNA SHAKE)  237 mL Oral BID BM  . feeding supplement (PRO-STAT SUGAR FREE 64)  30 mL Oral TID WC  . heparin subcutaneous  5,000 Units Subcutaneous 3 times per day  . insulin aspart  0-9 Units Subcutaneous TID AC & HS  . metoprolol tartrate  12.5 mg Oral BID  . mirtazapine  7.5 mg Oral QHS  . silver nitrate applicators  10 Stick Topical Once  . sodium chloride  3 mL Intravenous Q12H   Continuous Infusions:  PRN Meds:.acetaminophen, acetaminophen, dextrose, morphine injection, ondansetron (ZOFRAN) IV, ondansetron  BP 114/55  Pulse 68  Temp(Src) 97.8 F (36.6 C) (Oral)  Resp 18  Ht 5\' 6"  (1.676 m)  Wt 54.885 kg (121 lb)  BMI 19.54 kg/m2  SpO2 99%   PPS: 20%   Intake/Output Summary (Last 24 hours) at 08/19/14 1527 Last data filed at 08/19/14 1051  Gross per 24 hour  Intake     10 ml  Output    655 ml  Net   -645 ml      LBM: unknown  Physical Exam:  General: NAD, frail, thin, cachectic HEENT: Temporal muscle wasting, no JVD,  moist mucous membranes without exudate Chest: CTA throughout, no labored breathing, symmetric CVS: RRR, S1 S2, murmur Abdomen: Soft, NT, ND, +BS Ext: BLE feet pressure ulcers with dressing intact, warm to touch Neuro: Somnolent, unable to assess orientation  Labs: CBC    Component Value Date/Time   WBC 12.5* 08/18/2014 0615   RBC 3.23* 08/18/2014 0615   RBC 2.85* 07/05/2014 0955   HGB 8.8* 08/18/2014 0615   HCT 26.2* 08/18/2014 0615   PLT PLATELET CLUMPS NOTED ON SMEAR, COUNT APPEARS INCREASED 08/18/2014 0615   MCV 81.1 08/18/2014 0615   MCH 27.2 08/18/2014 0615   MCHC 33.6 08/18/2014 0615   RDW 15.3 08/18/2014 0615   LYMPHSABS 1.7 08/14/2014 2113   MONOABS 0.9 08/14/2014 2113   EOSABS 0.2 08/14/2014 2113   BASOSABS 0.0 08/14/2014 2113    BMET    Component Value Date/Time   NA 137 08/18/2014 0615   K 4.1 08/18/2014 0615   CL 97 08/18/2014 0615   CO2 27 08/18/2014 0615   GLUCOSE 140* 08/18/2014 0615   BUN 3* 08/18/2014 0615   CREATININE 0.31* 08/18/2014 0615   CALCIUM 7.6* 08/18/2014 0615   GFRNONAA >90 08/18/2014 0615   GFRAA >90 08/18/2014 0615    CMP     Component Value Date/Time   NA 137 08/18/2014 0615   K 4.1 08/18/2014  0615   CL 97 08/18/2014 0615   CO2 27 08/18/2014 0615   GLUCOSE 140* 08/18/2014 0615   BUN 3* 08/18/2014 0615   CREATININE 0.31* 08/18/2014 0615   CALCIUM 7.6* 08/18/2014 0615   PROT 5.0* 08/14/2014 2113   ALBUMIN 1.6* 08/14/2014 2113   AST 17 08/14/2014 2113   ALT 13 08/14/2014 2113   ALKPHOS 102 08/14/2014 2113   BILITOT 0.4 08/14/2014 2113   GFRNONAA >90 08/18/2014 0615   GFRAA >90 08/18/2014 0615    Assessment and Plan: 1. Code Status: DNR 2. Symptom Control: 1. Pain: Acetaminophen prn. Morphine 1 mg IV every 4 hours prn - used for wound care.  2. Weakness: Continue medical managment. PT following.  3. Wounds: Continue hydrotherapy as indicated. Ortho consulted for exposed tendon in right foot decubitus ulcer.  4. Severe  protein calorie malnutrition: Dietician following and making recommendations. Encouraging prostat and Glucerna.  5. Bowel Regimen: Dulcolax supp daily prn.  3. Psycho/Social: Emotional support provided to patient and to family.  4. Disposition: To be determined on outcomes.   Patient Documents Completed or Given: Document Given Completed  Advanced Directives Pkt    MOST    DNR    Gone from My Sight    Hard Choices yes     Time In Time Out Total Time Spent with Patient Total Overall Time  1540 1630 40min 50min    Greater than 50%  of this time was spent counseling and coordinating care related to the above assessment and plan.  Yong ChannelAlicia Zayonna Ayuso, NP Palliative Medicine Team Pager # [phone] (M-F 8a-5p) Team Phone # [phone] (Nights/Weekends)

## 2014-08-19 NOTE — Clinical Social Work Note (Signed)
CSW attempted to reach daughter by phone to follow-up regarding bed offers. Message left.  Genelle BalVanessa Donivin Wirt, MSW, LCSW [phone]

## 2014-08-19 NOTE — Progress Notes (Addendum)
Full note to follow:  I met today with Lorraine Tran's daughter, Annabell, and Annabell's husband Richardson Landry. They tell me about Ms. Sokolowki's history and Annabell wishes for her mother to be comfortable. DNR confirmed and she would not want a feeding tube. Annabell does understand and wants guidance as far as what is going to be reversible and beneficial for her mother - she is very understanding and realistic. She also understands that there does come a time where comfort care is the best we can do for Ms. Weinheimer and she agrees. However at this point she would like to continue PT and antibiotics and supportive treatment. After speaking with Casmalia RN about her exposed tendon on her right foot ulcer and the recommendation for vascular/orthopedics to evaluate for the possible need of surgical intervention. Annabell does not believe surgery would be a good thing for her mother but is open to discussion of whatever is recommended as the best treatment option for her mother. I will continue to follow and discuss options with Annabell. She says she is more often here in the afternoons around 2 pm.  Vinie Sill, NP Palliative Medicine Team Pager # [phone] (M-F 8a-5p) Team Phone # [phone] (Nights/Weekends)

## 2014-08-19 NOTE — Progress Notes (Signed)
The skilled treatment note has been reviewed and SLP is in agreement. Estephan Gallardo, M.A., CCC-SLP [phone]  

## 2014-08-19 NOTE — Progress Notes (Signed)
Physical Therapy Wound Treatment Patient Details  Name: Lorraine Tran MRN: [id number] Date of Birth: 1922-10-02  Today's Date: 08/19/2014 Time: 1000-1045 Time Calculation (min): 45 min  Subjective  Subjective: No I'm not...  (comfortable) Patient and Family Stated Goals: pt unable  Pain Score: Pain Score: 0-No pain  Wound Assessment  Pressure Ulcer 08/11/14 Stage II -  Partial thickness loss of dermis presenting as a shallow open ulcer with a red, pink wound bed without slough. wound open, drainage present (Active)  Dressing Clean;Dry;Intact 08/17/2014  7:37 PM  Margins Unattached edges (unapproximated) 08/11/2014 10:28 PM  Drainage Amount Moderate 08/11/2014 10:28 PM  Drainage Description Serosanguineous;Odor 08/11/2014 10:28 PM     Pressure Ulcer 08/11/14 Stage II -  Partial thickness loss of dermis presenting as a shallow open ulcer with a red, pink wound bed without slough. open pressure ulcer to bilat heels (Active)  Dressing Type Compression wrap 08/19/2014  9:52 AM  Dressing Clean;Dry;Intact 08/19/2014  9:52 AM  State of Healing Eschar 08/13/2014  8:45 PM  Site / Wound Assessment Bleeding;Brown 08/11/2014 10:29 PM  Drainage Amount None 08/12/2014  7:50 PM  Drainage Description Serosanguineous;Odor 08/11/2014 10:29 PM  Treatment Other (Comment) 08/13/2014  2:40 AM     Pressure Ulcer 08/11/14 Stage II -  Partial thickness loss of dermis presenting as a shallow open ulcer with a red, pink wound bed without slough. open pressure ulcer to back of left ankle (Active)  Dressing Type None 08/18/2014 11:00 PM  State of Healing Eschar 08/13/2014  8:45 PM  Site / Wound Assessment Bleeding;Brown 08/11/2014 10:30 PM  Drainage Amount None 08/12/2014  7:50 PM  Drainage Description Serosanguineous 08/11/2014 10:30 PM  Treatment Other (Comment) 08/14/2014  3:50 AM     Pressure Ulcer 08/11/14 Deep Tissue Injury - Purple or maroon localized area of discolored intact skin or blood-filled  blister due to damage of underlying soft tissue from pressure and/or shear. pressure ulcers present to tops of bilat feet. (Active)  Dressing Type None 08/18/2014 11:00 PM  Dressing Changed 08/18/2014 11:00 PM  State of Healing Eschar 08/13/2014  8:45 PM  Site / Wound Assessment Black;Bleeding 08/13/2014  8:45 PM  Drainage Amount Minimal 08/12/2014  7:50 PM  Drainage Description Sanguineous 08/12/2014  7:50 PM  Treatment Other (Comment) 08/14/2014  3:50 AM     Pressure Ulcer 08/12/14 Unstageable - Full thickness tissue loss in which the base of the ulcer is covered by slough (yellow, tan, gray, green or brown) and/or eschar (tan, brown or black) in the wound bed. yellow thick slough (Active)  Dressing Type ABD;Gauze (Comment);Moist to dry 08/19/2014 11:58 AM  Dressing Intact 08/19/2014 11:58 AM  Dressing Change Frequency Daily 08/19/2014 11:58 AM  State of Healing Eschar 08/19/2014 11:58 AM  Site / Wound Assessment Yellow;Black;Pink;Pale 08/19/2014 11:58 AM  % Wound base Red or Granulating 30% 08/19/2014 11:58 AM  % Wound base Yellow 70% 08/19/2014 11:58 AM  % Wound base Black 0% 08/19/2014 11:58 AM  % Wound base Other (Comment) 0% 08/19/2014 11:58 AM  Peri-wound Assessment Intact 08/19/2014 11:58 AM  Wound Length (cm) 10.5 cm 08/14/2014 10:00 AM  Wound Width (cm) 10 cm 08/14/2014 10:00 AM  Wound Depth (cm) 3 cm 08/14/2014 10:00 AM  Margins Unattached edges (unapproximated) 08/19/2014 11:58 AM  Drainage Amount Minimal 08/19/2014 11:58 AM  Drainage Description Serosanguineous;Other (Comment) 08/19/2014 11:58 AM  Treatment Cleansed;Debridement (Selective);Hydrotherapy (Pulse lavage);Packing (Saline gauze) 08/19/2014 11:58 AM     Incision (Closed) 07/06/14 Leg Left (Active)  Incision (Closed) 07/06/14 Leg Right (Active)   Hydrotherapy Pulsed lavage therapy - wound location: sacrum Pulsed Lavage with Suction (psi): 8 psi Pulsed Lavage with Suction - Normal Saline Used: 1000  mL Pulsed Lavage Tip: Tip with splash shield Selective Debridement Selective Debridement - Location: sacrum Selective Debridement - Tools Used: Forceps;Scalpel;Scissors Selective Debridement - Tissue Removed: yellow eschar, necrotic tissue   Wound Assessment and Plan  Wound Therapy - Assess/Plan/Recommendations Wound Therapy - Clinical Statement: pt can benefit from PLS to cleanse, soften tissues to allow for extensive selective debridement to get to healthy tissues. Wound Therapy - Functional Problem List: pt appears to be undergo infrequent mobility and cognitively doesn't have the motivation or awareness to move and get pressure relief. Factors Delaying/Impairing Wound Healing: Immobility;Incontinence Hydrotherapy Plan: Debridement;Dressing change;Patient/family education;Pulsatile lavage with suction Wound Therapy - Frequency: 6X / week Wound Therapy - Current Recommendations: PT Wound Therapy - Follow Up Recommendations: Skilled nursing facility Wound Plan: see above  Wound Therapy Goals- Improve the function of patient's integumentary system by progressing the wound(s) through the phases of wound healing (inflammation - proliferation - remodeling) by: Decrease Necrotic Tissue to: 25% Decrease Necrotic Tissue - Progress: Progressing toward goal Increase Granulation Tissue to: 75% (including any viable CT) Increase Granulation Tissue - Progress: Progressing toward goal Improve Drainage Characteristics: Min;Serous Improve Drainage Characteristics - Progress: Progressing toward goal Goals/treatment plan/discharge plan were made with and agreed upon by patient/family: No, Patient unable to participate in goals/treatment/discharge plan and family unavailable Time For Goal Achievement: 7 days Wound Therapy - Potential for Goals: Good  Goals will be updated until maximal potential achieved or discharge criteria met.  Discharge criteria: when goals achieved, discharge from hospital, MD  decision/surgical intervention, no progress towards goals, refusal/missing three consecutive treatments without notification or medical reason.  GP     Livi Mcgann, Tessie Fass 08/19/2014, 12:01 PM 08/19/2014  Donnella Sham, Secretary [phone]  (pager)

## 2014-08-20 DIAGNOSIS — I739 Peripheral vascular disease, unspecified: Secondary | ICD-10-CM

## 2014-08-20 LAB — GLUCOSE, CAPILLARY
Glucose-Capillary: 150 mg/dL — ABNORMAL HIGH (ref 70–99)
Glucose-Capillary: 230 mg/dL — ABNORMAL HIGH (ref 70–99)
Glucose-Capillary: 233 mg/dL — ABNORMAL HIGH (ref 70–99)
Glucose-Capillary: 147 mg/dL — ABNORMAL HIGH (ref 70–99)

## 2014-08-20 MED ORDER — BISACODYL 10 MG RE SUPP: 10.0000 mg | Freq: Every day | RECTAL | Status: DC | PRN

## 2014-08-20 NOTE — Progress Notes (Signed)
Physical Therapy Wound Treatment Patient Details  Name: Lorraine Tran MRN: [id number] Date of Birth: 04-09-1922  Today's Date: 08/20/2014 Time: 1015-1059 Time Calculation (min): 44 min  Subjective  Patient and Family Stated Goals: pt unable  Pain Score:    Wound Assessment  Pressure Ulcer 08/11/14 Stage II -  Partial thickness loss of dermis presenting as a shallow open ulcer with a red, pink wound bed without slough. wound open, drainage present (Active)  Dressing Type Foam 08/20/2014  9:00 AM  Dressing Clean;Dry 08/20/2014  9:00 AM  Site / Wound Assessment Dressing in place / Unable to assess 08/19/2014  8:55 PM  Margins Unattached edges (unapproximated) 08/11/2014 10:28 PM  Drainage Amount Moderate 08/11/2014 10:28 PM  Drainage Description Serosanguineous;Odor 08/11/2014 10:28 PM     Pressure Ulcer 08/11/14 Stage II -  Partial thickness loss of dermis presenting as a shallow open ulcer with a red, pink wound bed without slough. open pressure ulcer to bilat heels (Active)  Dressing Type Gauze (Comment);Other (Comment) 08/19/2014  8:55 PM  Dressing Clean;Dry;Intact 08/19/2014  8:55 PM  Dressing Change Frequency Daily 08/19/2014  8:55 PM  State of Healing Eschar 08/19/2014  8:55 PM  Site / Wound Assessment Bleeding;Brown 08/11/2014 10:29 PM  Peri-wound Assessment Erythema (blanchable) 08/19/2014  8:55 PM  Drainage Amount None 08/19/2014  8:55 PM  Drainage Description Odor 08/19/2014  8:55 PM  Treatment Other (Comment) 08/13/2014  2:40 AM     Pressure Ulcer 08/11/14 Stage II -  Partial thickness loss of dermis presenting as a shallow open ulcer with a red, pink wound bed without slough. open pressure ulcer to back of left ankle (Active)  Dressing Type Gauze (Comment);Other (Comment) 08/19/2014  8:55 PM  Dressing Clean;Intact;Dry 08/19/2014  8:55 PM  Dressing Change Frequency Daily 08/19/2014  8:55 PM  State of Healing Eschar 08/19/2014  8:55 PM  Site / Wound Assessment  Bleeding;Brown 08/11/2014 10:30 PM  Drainage Amount None 08/19/2014  8:55 PM  Drainage Description Serosanguineous 08/11/2014 10:30 PM  Treatment Other (Comment) 08/14/2014  3:50 AM     Pressure Ulcer 08/11/14 Deep Tissue Injury - Purple or maroon localized area of discolored intact skin or blood-filled blister due to damage of underlying soft tissue from pressure and/or shear. pressure ulcers present to tops of bilat feet. (Active)  Dressing Type Gauze (Comment);Other (Comment) 08/19/2014  8:55 PM  Dressing Changed;Clean;Intact;Dry 08/19/2014  8:55 PM  State of Healing Eschar 08/13/2014  8:45 PM  Site / Wound Assessment Black 08/19/2014  8:55 PM  Drainage Amount None 08/19/2014  8:55 PM  Drainage Description Sanguineous 08/12/2014  7:50 PM  Treatment Other (Comment) 08/14/2014  3:50 AM     Pressure Ulcer 08/12/14 Unstageable - Full thickness tissue loss in which the base of the ulcer is covered by slough (yellow, tan, gray, green or brown) and/or eschar (tan, brown or black) in the wound bed. yellow thick slough (Active)  Dressing Type ABD;Gauze (Comment);Moist to dry 08/20/2014  1:45 PM  Dressing Intact 08/20/2014  1:45 PM  Dressing Change Frequency Daily 08/20/2014  1:45 PM  State of Healing Eschar 08/20/2014  1:45 PM  Site / Wound Assessment Yellow;Black;Pink;Pale 08/20/2014  1:45 PM  % Wound base Red or Granulating 70% 08/20/2014  1:45 PM  % Wound base Yellow 30% 08/20/2014  1:45 PM  % Wound base Black 0% 08/20/2014  1:45 PM  % Wound base Other (Comment) 0% 08/20/2014  1:45 PM  Peri-wound Assessment Intact 08/20/2014  1:45 PM  Wound Length (cm)  10.5 cm 08/14/2014 10:00 AM  Wound Width (cm) 10 cm 08/14/2014 10:00 AM  Wound Depth (cm) 3 cm 08/14/2014 10:00 AM  Margins Unattached edges (unapproximated) 08/20/2014  1:45 PM  Drainage Amount Minimal 08/20/2014  1:45 PM  Drainage Description Serosanguineous;Other (Comment) 08/20/2014  1:45 PM  Treatment Cleansed;Debridement  (Selective);Hydrotherapy (Pulse lavage);Packing (Saline gauze) 08/20/2014  1:45 PM     Incision (Closed) 07/06/14 Leg Left (Active)     Incision (Closed) 07/06/14 Leg Right (Active)   Hydrotherapy Pulsed lavage therapy - wound location: sacrum Pulsed Lavage with Suction (psi): 8 psi Pulsed Lavage with Suction - Normal Saline Used: 1000 mL Pulsed Lavage Tip: Tip with splash shield Selective Debridement Selective Debridement - Location: sacrum Selective Debridement - Tools Used: Forceps;Scalpel;Scissors Selective Debridement - Tissue Removed: yellow eschar, necrotic tissue   Wound Assessment and Plan  Wound Therapy - Assess/Plan/Recommendations Wound Therapy - Clinical Statement: pt can benefit from PLS to cleanse, soften tissues to allow for extensive selective debridement to get to healthy tissues. Wound Therapy - Functional Problem List: pt appears to be undergo infrequent mobility and cognitively doesn't have the motivation or awareness to move and get pressure relief. Factors Delaying/Impairing Wound Healing: Immobility;Incontinence Hydrotherapy Plan: Debridement;Dressing change;Patient/family education;Pulsatile lavage with suction Wound Therapy - Frequency: 6X / week Wound Therapy - Current Recommendations: PT Wound Therapy - Follow Up Recommendations: Skilled nursing facility Wound Plan: see above  Wound Therapy Goals- Improve the function of patient's integumentary system by progressing the wound(s) through the phases of wound healing (inflammation - proliferation - remodeling) by: Decrease Necrotic Tissue to: 25% Decrease Necrotic Tissue - Progress: Progressing toward goal Increase Granulation Tissue to: 75% (including any viable CT) Increase Granulation Tissue - Progress: Progressing toward goal Improve Drainage Characteristics: Min;Serous Improve Drainage Characteristics - Progress: Progressing toward goal Goals/treatment plan/discharge plan were made with and agreed upon  by patient/family: No, Patient unable to participate in goals/treatment/discharge plan and family unavailable Time For Goal Achievement: 7 days Wound Therapy - Potential for Goals: Good  Goals will be updated until maximal potential achieved or discharge criteria met.  Discharge criteria: when goals achieved, discharge from hospital, MD decision/surgical intervention, no progress towards goals, refusal/missing three consecutive treatments without notification or medical reason.  GP     Danelia Snodgrass, Tessie Fass 08/20/2014, 1:47 PM 08/20/2014  Donnella Sham, [place] [phone]  (pager)

## 2014-08-20 NOTE — Progress Notes (Signed)
VASCULAR LAB PRELIMINARY  ARTERIAL  ABI completed:    RIGHT    LEFT    PRESSURE WAVEFORM  PRESSURE WAVEFORM  BRACHIAL 105 Triphasic BRACHIAL 156 Triphasic  AT 252  AT 128   PT 7  PT 117 Dampened Monophasic    RIGHT LEFT  ABI 1.62 0.82   Technically difficult due to the condition of the feet.  Doppler signals except for the left posterior tibial artery were barely audible. ABI on the right indicates calcification of the anterior tibial artery and a near occlusion of the posterior tibial artery. Left ABI indicates a mild and moderate reduction in arterial flow however Doppler signals would also suggest a false elevation in pressures.Marland Kitchen.   Lorraine Tran, RVS 08/20/2014, 2:31 PM

## 2014-08-20 NOTE — Progress Notes (Signed)
I spoke with Annabell over the phone and she tells me that she spoke with a doctor this morning that explained that the only fix for her mother's bilateral feet ulcers would be a bilateral above-the-knee amputations. Annabell says she would not let them do surgery on her mother. We discussed again that these are problems we are not going to be able to fix and that a focus on her comfort is the best option for her. Annabell agrees. I plan to discuss the details of a comfort plan and MOST form tomorrow. Annabell has been insisting on return to rehab, however, she will not be able to participate in any type of rehab program. Will re-address hospice facility option with Annabell tomorrow.   Yong ChannelAlicia Etta Gassett, NP Palliative Medicine Team Pager # [phone] (M-F 8a-5p) Team Phone # [phone] (Nights/Weekends)

## 2014-08-20 NOTE — Consult Note (Signed)
WOC reviewed orthopedic evaluation, new orders for silvadene written per ortho for the bilateral foot ulcers.  WOC will follow along with medical team for the sacral ulcer and LE ulcers.    Lindberg Zenon [place] RN,CWOCN [phone]

## 2014-08-20 NOTE — Progress Notes (Addendum)
TEAM 1 Transfer 10/24  Lorraine Tran UJW:[id number] DOB: [birth year] DOA: 08/11/2014 PCP: Darrow BussingKOIRALA,DIBAS, MD  Admit HPI / Brief Narrative: 80 year old female with history of A. fib on Eliquis, type 2 diabetes mellitus with A1c of 6.7, recently started on insulin, hx of stroke, sacral decubitus, dementia, recent hospitalization for right tibial fracture status post repair and discharged to skilled nursing facility was sent to the ED with altered mental status. Patient is confused and nonverbal, unable to provide any history. Patient reportedly was confused, lethargic and poorly responsive in the nursing home. Upon arrival to the ED she had a low-grade temperature of 99.8 Fahrenheit, tachycardic to 128, tachypnea of the 20 with normal blood pressure. O2 sat was maintained.  Blood work done showed WBC of 23.7 with UA was suggestive of UTI. She also was found to have foul-smelling sacral decubitus ulcer. She was initially treated with IV vancomycin and cefepime for sepsis and hospitalist admission requested to step down unit. Blood cultures and urine cultures sent from the ED.  Chest x-ray and head CT unremarkable.   At baseline per daughter she has mild dementia but seems quite oriented. She saw her 1 day prior to admission and was at her baseline. She was participating with PT at SNF . She has been eating well as well.  As per daughter , patient has new bedsores in the back starting only 3 weeks back. Also has new eschars in b/l foot which daughter thinks is from the cast/ dressing.  Urine cultures drawn on 08/11/2014 grew Enterobacter and Escherichia coli, both organisms being susceptible to ciprofloxacin. On 08/15/2014 ceftriaxone was discontinued as she was started on Cipro 400 mg IV twice a day. Given clinical stability she was transferred out of the step down unit to the floor. Speech pathology evaluated patient on 08/15/2014 recommended dysphasia 1 thin liquid diet. Currently getting  hydrotherapy for her sacral wound, has a new purulent discharge from her right dorsal foot ulcer, which has been evaluated by orthopedic and recommendation has been for wound care with Silvadene.   HPI/Subjective: No complaints, mumbles few words, no events overnight  Assessment/Plan: Severe sepsis - gram negative rod bacteremia  -Likely a combination of UTI and infected sacral decubitus  -Enterobacter bacteremia on 08/11/2014  -Ceftriaxone was discontinued on 08/15/2014 started on ciprofloxacin 400 mg IV twice a day, urine cultures growing Enterobacter and Escherichia coli, both organisms susceptible to ciprofloxacin, will RX for 2 weeks from 10/23, till 11/6 -Wound care/Gen Surgery recommended hydrotherapy for sacral ulcer   -per PT will need 1/2 days of hydrotherapy, and may require wound vacuum prior to discharge.  Decubitus ulcer of sacral region, stage 3  -Gen Surgery/Wound Care recommended hydrotherapy   -continue hydrotherapy per PT  Urinary tract infection (Enterobacter Cloacae.)  -As mentioned above, urine cultures growing Enterobacter and Escherichia coli -Patient changed to ciprofloxacin 400 mg IV twice a day on 08/15/2014  Dementia -stable  Hypertension  -stable now, Po metoprolol  Acute toxic metabolic encephalopathy  -Patient has mild underlying dementia which is worsened with sepsis  -Head CT negative  -stable, likely at baseline   Metabolic acidosis / lactic acidosis  - resolved, continue to monitor  Hypokalemia -replace  Moderate Aortic stenosis  DM type 2  -Cont sensitive SSI - CBG currently reasonably controlled   Bilateral foot wounds  -due to DM and pressure wounds - Wound Care following - no plan for further workup at this time - plain xrays w/o evidence of osteo at  this time  - patient has discharge from right foot dorsum, orthopedic consult appreciated, continue with local wound care with Silvadene. - Follow on wound culture. Atrial  fibrillation  -Currently in sinus rhythm  - restarted metoprolol at 12.5 mg by mouth twice a day - not on Anticoagulation   Iron deficiency Anemia  -Continue iron supplements when able to tolerate oral intake   Acute kidney injury  -Due to dehydration/sepsis -resolved with hydration  Protein calorie malnutrition  -Started pure diet  GLOBAL: GIVEN Advanced age, Dementia, Sacral decubitus wounds, bed bound status, PC Malnutrition, DM, discussed poor prognosis , palliative care discussed with daughter. They were updated on 10/28 over the phone.  seems very reasonable    Code Status: DNR Family Communication:none at bedside,  Disposition Plan: Likely d/c to SNF when medically stable   Consultants: Dr. Abigail Miyamoto (surgery) RN Melody Eliberto Ivory (wound care) Dr. Lajoyce Corners (Ortho) Procedure/Significant Events: 9/13 echocardiogram Left ventricle:mild concentric hypertrophy. LVEF= 65% to 70%. -(grade 1 diastolic dysfunction). Doppler parameters - Aortic valve: moderate stenosis.  - Pulmonary arteries: PA peak pressure: 60 mm Hg (S).     Culture 10/19 blood right hand/arm positive Enterobacter Cloacae. 10/20 wound organisms present 10/20 MRSA by PCR negative   Antibiotics: Cefepime 10/20>> stopped 10/22 Vancomycin 10/20>> stopped 10/22 Ceftriaxone 10/22>> 10/232 Ciprofloxacin 10/23>>   DVT prophylaxis: Subcutaneous heparin   Devices NA   LINES / TUBES:      Continuous Infusions:    Objective: VITAL SIGNS: Temp: 97.7 F (36.5 C) (10/28 0530) BP: 114/52 mmHg (10/28 1303) Pulse Rate: 72 (10/28 1303) SPO2; FIO2:   Intake/Output Summary (Last 24 hours) at 08/20/14 1357 Last data filed at 08/20/14 0900  Gross per 24 hour  Intake      3 ml  Output   1025 ml  Net  -1022 ml     Exam: General: A./O. X to self only, NAD, follows some commands, chronically ill appearing Lungs: Clear to auscultation bilaterally without wheezes or crackles Cardiovascular:  Regular rate and rhythm, systolic ejection murmur, no gallop or rub normal S1 and S2 Abdomen: Nontender, nondistended, soft, bowel sounds positive, no rebound, no ascites, no appreciable mass Extremities: Bilateral eschar on the Rt>Lt , bleeding, tender to palpation, negative pus, negative warmth. Bilateral lower extremity blistering, wrapped. Sacral decub ulcers with dressing   Data Reviewed: Basic Metabolic Panel:  Recent Labs Lab 08/14/14 0308 08/14/14 1441 08/14/14 2113 08/16/14 0438 08/18/14 0615  NA 140  --  135* 137 137  K 3.1* 3.8 3.6* 3.0* 4.1  CL 103  --  98 100 97  CO2 27  --  22 22 27   GLUCOSE 123*  --  181* 146* 140*  BUN 10  --  8 5* 3*  CREATININE 0.31*  --  0.25* 0.23* 0.31*  CALCIUM 7.4*  --  7.4* 7.4* 7.6*  MG  --  1.4* 2.2  --   --    Liver Function Tests:  Recent Labs Lab 08/14/14 0308 08/14/14 2113  AST 13 17  ALT 12 13  ALKPHOS 86 102  BILITOT 0.5 0.4  PROT 4.8* 5.0*  ALBUMIN 1.4* 1.6*   No results found for this basename: LIPASE, AMYLASE,  in the last 168 hours No results found for this basename: AMMONIA,  in the last 168 hours CBC:  Recent Labs Lab 08/14/14 0308 08/14/14 2113 08/16/14 0438 08/18/14 0615  WBC 12.1* 14.8* 13.6* 12.5*  NEUTROABS  --  12.1*  --   --  HGB 9.1* 8.8* 8.4* 8.8*  HCT 27.9* 26.9* 25.3* 26.2*  MCV 83.0 83.0 83.0 81.1  PLT 684* 638* 579* PLATELET CLUMPS NOTED ON SMEAR, COUNT APPEARS INCREASED   Cardiac Enzymes: No results found for this basename: CKTOTAL, CKMB, CKMBINDEX, TROPONINI,  in the last 168 hours BNP (last 3 results) No results found for this basename: PROBNP,  in the last 8760 hours CBG:  Recent Labs Lab 08/19/14 1131 08/19/14 1706 08/19/14 2038 08/20/14 0757 08/20/14 1150  GLUCAP 208* 172* 155* 150* 230*    Recent Results (from the past 240 hour(s))  URINE CULTURE     Status: None   Collection Time    08/11/14 10:46 PM      Result Value Ref Range Status   Specimen Description URINE,  CATHETERIZED   Final   Special Requests NONE   Final   Culture  Setup Time     Final   Value: 08/12/2014 04:15     Performed at [facility]   Colony Count     Final   Value: >=100,000 COLONIES/ML     Performed at [facility]   Culture     Final   Value: ESCHERICHIA COLI     Performed at [facility]   Report Status 08/15/2014 FINAL   Final   Organism ID, Bacteria ESCHERICHIA COLI   Final  CULTURE, BLOOD (ROUTINE X 2)     Status: None   Collection Time    08/11/14 10:55 PM      Result Value Ref Range Status   Specimen Description BLOOD RIGHT ARM   Final   Special Requests BOTTLES DRAWN AEROBIC AND ANAEROBIC 5CC EA   Final   Culture  Setup Time     Final   Value: 08/12/2014 04:21     Performed at [facility]   Culture     Final   Value: ENTEROBACTER CLOACAE     Note: Gram Stain Report Called to,Read Back By and Verified With: AMANDA PETTIFORD ON 08/12/2014 AT 9:52P BY WILEJ     Performed at [facility]   Report Status 08/14/2014 FINAL   Final   Organism ID, Bacteria ENTEROBACTER CLOACAE   Final  CULTURE, BLOOD (ROUTINE X 2)     Status: None   Collection Time    08/11/14 11:05 PM      Result Value Ref Range Status   Specimen Description BLOOD RIGHT HAND   Final   Special Requests BOTTLES DRAWN AEROBIC AND ANAEROBIC 4CC EA   Final   Culture  Setup Time     Final   Value: 08/12/2014 04:21     Performed at [facility]   Culture     Final   Value: ENTEROBACTER CLOACAE     Note: SUSCEPTIBILITIES PERFORMED ON PREVIOUS CULTURE WITHIN THE LAST 5 DAYS.     Note: Gram Stain Report Called to,Read Back By and Verified With: AMANDA PETTIFORD ON 08/12/2014 AT 9:52P BY WILEJ     Performed at [facility]   Report Status 08/14/2014 FINAL   Final  WOUND CULTURE     Status: None   Collection Time    08/12/14  8:02 AM      Result Value Ref Range Status   Specimen Description WOUND   Final   Special Requests SACRAL   Final   Gram  Stain     Final   Value: NO WBC SEEN     NO SQUAMOUS EPITHELIAL CELLS  SEEN     MODERATE GRAM POSITIVE COCCI     IN PAIRS IN CLUSTERS FEW GRAM NEGATIVE RODS     Performed at [facility]   Culture     Final   Value: MULTIPLE ORGANISMS PRESENT, NONE PREDOMINANT     Note: NO STAPHYLOCOCCUS AUREUS ISOLATED NO GROUP A STREP (S.PYOGENES) ISOLATED     Performed at [facility]   Report Status 08/14/2014 FINAL   Final  MRSA PCR SCREENING     Status: None   Collection Time    08/12/14  3:35 PM      Result Value Ref Range Status   MRSA by PCR NEGATIVE  NEGATIVE Final   Comment:            The GeneXpert MRSA Assay (FDA     approved for NASAL specimens     only), is one component of a     comprehensive MRSA colonization     surveillance program. It is not     intended to diagnose MRSA     infection nor to guide or     monitor treatment for     MRSA infections.  WOUND CULTURE     Status: None   Collection Time    08/19/14  2:34 PM      Result Value Ref Range Status   Specimen Description WOUND RIGHT FOOT   Final   Special Requests NONE   Final   Gram Stain     Final   Value: ABUNDANT WBC PRESENT, PREDOMINANTLY PMN     NO SQUAMOUS EPITHELIAL CELLS SEEN     NO ORGANISMS SEEN     Performed at [facility]   Culture PENDING   Incomplete   Report Status PENDING   Incomplete     Studies:  Recent x-ray studies have been reviewed in detail by the Attending Physician  Scheduled Meds:  Scheduled Meds: . ciprofloxacin  400 mg Intravenous Q12H  . collagenase   Topical Daily  . feeding supplement (GLUCERNA SHAKE)  237 mL Oral BID BM  . feeding supplement (PRO-STAT SUGAR FREE 64)  30 mL Oral TID WC  . heparin subcutaneous  5,000 Units Subcutaneous 3 times per day  . insulin aspart  0-9 Units Subcutaneous TID AC & HS  . metoprolol tartrate  12.5 mg Oral BID  . mirtazapine  7.5 mg Oral QHS  . silver nitrate applicators  10 Stick Topical Once  . silver sulfADIAZINE    Topical Daily  . sodium chloride  3 mL Intravenous Q12H  . vancomycin  750 mg Intravenous Q24H    Time spent on care of this patient: 35 mins   Lorrie Gargan , MD    If 7PM-7AM, please contact night-coverage [email or web address] Password [email or web address] 08/20/2014, 1:57 PM   LOS: 9 days

## 2014-08-21 DIAGNOSIS — D62 Acute posthemorrhagic anemia: Secondary | ICD-10-CM

## 2014-08-21 LAB — GLUCOSE, CAPILLARY
GLUCOSE-CAPILLARY: 203 mg/dL — AB (ref 70–99)
GLUCOSE-CAPILLARY: 181 mg/dL — AB (ref 70–99)
GLUCOSE-CAPILLARY: 216 mg/dL — AB (ref 70–99)
Glucose-Capillary: 182 mg/dL — ABNORMAL HIGH (ref 70–99)

## 2014-08-21 MED ORDER — INSULIN GLARGINE 100 UNIT/ML ~~LOC~~ SOLN
5.0000 [IU] | Freq: Every day | SUBCUTANEOUS | Status: DC
  Administered 2014-08-21: 5 [IU] via SUBCUTANEOUS
  Filled 2014-08-21 (×2): qty 0.05

## 2014-08-21 NOTE — Progress Notes (Signed)
The skilled treatment note has been reviewed and SLP is in agreement. Angelicia Lessner, M.A., CCC-SLP [phone]  

## 2014-08-21 NOTE — Progress Notes (Signed)
Speech Language Pathology Treatment: Dysphagia  Patient Details Name: Lorraine Tran MRN: [id number] DOB: Aug 26, 1922 Today's Date: 08/21/2014 Time: [id number] SLP Time Calculation (min): 8 min  Assessment / Plan / Recommendation Clinical Impression  Skilled treatment session focused on dysphagia goals. Upon arrival, patient was lying supine in bed and required Min A multimodal cues for participation in trials of current diet. Student facilitated session by providing Mod A multimodal cues for swallow initiation during trials of Dys. 1 textures with thin liquids via cup. Patient demonstrated a decreased swallow initiation (~10 seconds) but did not demonstrate any overt s/s of aspiration however patient only consumed minimal PO trials. RN reports poor PO intake as well. Therefore, recommend continue on current diet. SLP aware of palliative care meeting, will follow to complete education with daughter prior to d/c.     HPI HPI: 80 year old female admitted 08/11/14 [facility]. PMH significant for AFib, DM, dementia, Bilateral CVA. CXR and CT unremarkable. BSE administered 08/15/14 with recommendations for Dys. 1 diet with thin liquids.    Pertinent Vitals Pain Assessment: No/denies pain (No overt s/s of pain)  SLP Plan  Continue with current plan of care    Recommendations Diet recommendations: Dysphagia 1 (puree);Thin liquid Liquids provided via: Cup;Straw Medication Administration: Whole meds with puree Supervision: Staff to assist with self feeding;Full supervision/cueing for compensatory strategies Compensations: Slow rate;Small sips/bites Postural Changes and/or Swallow Maneuvers: Seated upright 90 degrees;Upright 30-60 min after meal              Oral Care Recommendations: Oral care BID Follow up Recommendations: 24 hour supervision/assistance Plan: Continue with current plan of care    GO     Audriella Blakeley 08/21/2014, 10:21 AM

## 2014-08-21 NOTE — Progress Notes (Addendum)
Ms. Lorraine Tran appears comfortable and is able to converse briefly with me and seems happy and content while smiling throughout our conversation. No signs of pain or discomfort. I spoke with Ms. Hojnacki's daughter, Annabell, via telephone. We discussed LTAC vs hospice facility. Annabell feels overwhelmed because this is the first she has heard about LTAC. We discussed what each option means and that unfortunately she cannot continue to stay in the hospital. Annabell tells me that she knows "mom is going to die." I encouraged her to consider how she wants her mother to spend the time she has left. I told her that I do not believe Ms. Woehrle will continue to live months but we are probably looking more at weeks, at best, given her extremely poor intake. Annabell says she will have to discuss these options with her sister who is a Engineer, civil (consulting)nurse in [place]. I will follow up tomorrow.    Yong ChannelAlicia Lander Eslick, NP Palliative Medicine Team Pager # [phone] (M-F 8a-5p) Team Phone # [phone] (Nights/Weekends)

## 2014-08-21 NOTE — Progress Notes (Signed)
Physical Therapy Wound Treatment and DISCHARGE  Pt's wounds are clean and at least 75% red and starting to granulate.  If the goal is to proceed to [facility], pt is ready.  If not, nursing can manage this wound well now. Will Sign off.   Patient Details  Name: Lorraine Tran MRN: [id number] Date of Birth: [birth year]  Today's Date: 08/21/2014 Time: 0737-1062 Time Calculation (min): 40 min  Subjective  Subjective: I hurt all over. Patient and Family Stated Goals: pt unable  Pain Score:    Wound Assessment  Pressure Ulcer 08/11/14 Stage II -  Partial thickness loss of dermis presenting as a shallow open ulcer with a red, pink wound bed without slough. wound open, drainage present (Active)  Dressing Type Foam 08/21/2014  7:44 AM  Dressing Clean;Dry;Intact 08/21/2014  7:44 AM  Site / Wound Assessment Dressing in place / Unable to assess 08/19/2014  8:55 PM  Margins Unattached edges (unapproximated) 08/11/2014 10:28 PM  Drainage Amount Moderate 08/11/2014 10:28 PM  Drainage Description Serosanguineous;Odor 08/11/2014 10:28 PM     Pressure Ulcer 08/11/14 Stage II -  Partial thickness loss of dermis presenting as a shallow open ulcer with a red, pink wound bed without slough. open pressure ulcer to bilat heels (Active)  Dressing Type Gauze (Comment) 08/21/2014  7:44 AM  Dressing Clean;Dry;Intact 08/21/2014  7:44 AM  Dressing Change Frequency Daily 08/19/2014  8:55 PM  State of Healing Eschar 08/19/2014  8:55 PM  Site / Wound Assessment Bleeding;Brown 08/11/2014 10:29 PM  Peri-wound Assessment Erythema (blanchable) 08/20/2014  9:03 PM  Drainage Amount None 08/20/2014  9:03 PM  Drainage Description Odor 08/20/2014  9:03 PM  Treatment Other (Comment) 08/13/2014  2:40 AM     Pressure Ulcer 08/11/14 Stage II -  Partial thickness loss of dermis presenting as a shallow open ulcer with a red, pink wound bed without slough. open pressure ulcer to back of left ankle (Active)  Dressing Type Gauze  (Comment) 08/21/2014  7:44 AM  Dressing Clean;Dry;Intact 08/21/2014  7:44 AM  Dressing Change Frequency Daily 08/19/2014  8:55 PM  State of Healing Eschar 08/19/2014  8:55 PM  Site / Wound Assessment Bleeding;Brown 08/11/2014 10:30 PM  Drainage Amount None 08/19/2014  8:55 PM  Drainage Description Serosanguineous 08/11/2014 10:30 PM  Treatment Other (Comment) 08/14/2014  3:50 AM     Pressure Ulcer 08/11/14 Deep Tissue Injury - Purple or maroon localized area of discolored intact skin or blood-filled blister due to damage of underlying soft tissue from pressure and/or shear. pressure ulcers present to tops of bilat feet. (Active)  Dressing Type Gauze (Comment) 08/21/2014  7:44 AM  Dressing Clean;Dry;Intact 08/21/2014  7:44 AM  State of Healing Eschar 08/13/2014  8:45 PM  Site / Wound Assessment Black 08/19/2014  8:55 PM  Drainage Amount None 08/19/2014  8:55 PM  Drainage Description Sanguineous 08/12/2014  7:50 PM  Treatment Other (Comment) 08/14/2014  3:50 AM     Pressure Ulcer 08/12/14 Unstageable - Full thickness tissue loss in which the base of the ulcer is covered by slough (yellow, tan, gray, green or brown) and/or eschar (tan, brown or black) in the wound bed. yellow thick slough (Active)  Dressing Type ABD;Gauze (Comment);Moist to dry 08/21/2014 10:54 AM  Dressing Intact 08/21/2014 10:54 AM  Dressing Change Frequency Daily 08/21/2014 10:54 AM  State of Healing Eschar 08/21/2014 10:54 AM  Site / Wound Assessment Yellow;Pink;Pale 08/21/2014 10:54 AM  % Wound base Red or Granulating 75% 08/21/2014 10:54 AM  % Wound base  Yellow 25% 08/21/2014 10:54 AM  % Wound base Black 0% 08/21/2014 10:54 AM  % Wound base Other (Comment) 0% 08/21/2014 10:54 AM  Peri-wound Assessment Intact 08/21/2014 10:54 AM  Wound Length (cm) 10.5 cm 08/14/2014 10:00 AM  Wound Width (cm) 10 cm 08/14/2014 10:00 AM  Wound Depth (cm) 3 cm 08/14/2014 10:00 AM  Margins Unattached edges (unapproximated) 08/21/2014  10:54 AM  Drainage Amount Minimal 08/21/2014 10:54 AM  Drainage Description Other (Comment);Serous 08/21/2014 10:54 AM  Treatment Cleansed;Debridement (Selective);Hydrotherapy (Pulse lavage);Packing (Saline gauze) 08/21/2014 10:54 AM     Incision (Closed) 07/06/14 Leg Left (Active)     Incision (Closed) 07/06/14 Leg Right (Active)   Hydrotherapy Pulsed lavage therapy - wound location: sacrum Pulsed Lavage with Suction (psi): 4 psi Pulsed Lavage with Suction - Normal Saline Used: 1000 mL Pulsed Lavage Tip: Tip with splash shield Selective Debridement Selective Debridement - Location: sacrum Selective Debridement - Tools Used: Forceps;Scalpel;Scissors Selective Debridement - Tissue Removed: yellow slough, sloughy fascia   Wound Assessment and Plan  Wound Therapy - Assess/Plan/Recommendations Wound Therapy - Clinical Statement: pt can benefit from PLS to cleanse, soften tissues to allow for extensive selective debridement to get to healthy tissues. Wound Therapy - Functional Problem List: pt appears to be undergo infrequent mobility and cognitively doesn't have the motivation or awareness to move and get pressure relief. Factors Delaying/Impairing Wound Healing: Immobility;Incontinence Hydrotherapy Plan: Debridement;Dressing change;Patient/family education;Pulsatile lavage with suction Wound Therapy - Frequency: 6X / week Wound Therapy - Current Recommendations: PT Wound Therapy - Follow Up Recommendations: Skilled nursing facility Wound Plan: see above  Wound Therapy Goals- Improve the function of patient's integumentary system by progressing the wound(s) through the phases of wound healing (inflammation - proliferation - remodeling) by: Decrease Necrotic Tissue to: 25% Decrease Necrotic Tissue - Progress: Met Increase Granulation Tissue to: 75% (including any viable CT) Increase Granulation Tissue - Progress: Met Improve Drainage Characteristics: Min;Serous Improve Drainage  Characteristics - Progress: Met Goals/treatment plan/discharge plan were made with and agreed upon by patient/family: No, Patient unable to participate in goals/treatment/discharge plan and family unavailable Time For Goal Achievement: 7 days Wound Therapy - Potential for Goals: Good  Goals will be updated until maximal potential achieved or discharge criteria met.  Discharge criteria: when goals achieved, discharge from hospital, MD decision/surgical intervention, no progress towards goals, refusal/missing three consecutive treatments without notification or medical reason.  GP     Jema Deegan, Tessie Fass 08/21/2014, 10:58 AM 08/21/2014  Donnella Sham, [place] [phone]  (pager)

## 2014-08-21 NOTE — Consult Note (Signed)
WOC contacted the daughter Annabell to provide update on the sacral wound and make sure she is aware of the orthopaedic evaluation of the bilateral LE wounds that we discussed earlier this week.  She is aware of Dr. Audrie Liauda's evaluation and is content with the current wound care provided for the bilateral foot ulcers. She does not wish to proceed with any surgery on the bilateral LE ulcers even if that would be an option.   The sacrum is cleaning up per PT and they are in agreement with the WOC nurse to progress this wound to use of NPWT VAC dressing after today's therapy.  I have explained the options for wound care normal saline vs. VAC dressing to the daughter and she wishes to proceed with VAC therapy.  We did discuss that her wishes are to have her mother return to a rehab facility and she wanted to know would they be able to [facility].  I explained that some facilities can and some can not and if the facility that she chooses does not care for patients with wound VAC dressing we can certainly reevaluate her at that time for other options for wound care.  I explained that the wound VAC would not heal this wound since her mother's nutritional levels are so compromised, she verbalized understanding.  We would use the VAC dressing to manage exudate, further remove necrotic tissue remaining in the wound bed and promote contracture and some granulation tissue formation.   WOC will continue to follow along with you for support with wound care. Sanjuana Mruk VintonAustin RN,CWOCN [phone]

## 2014-08-21 NOTE — Progress Notes (Signed)
Inpatient Diabetes Program Recommendations  AACE/ADA: New Consensus Statement on Inpatient Glycemic Control (2013)  Target Ranges:  Prepandial:   less than 140 mg/dL      Peak postprandial:   less than 180 mg/dL (1-2 hours)      Critically ill patients:  140 - 180 mg/dL   Results for Lorraine Tran, Lorraine Tran (MRN [id number]) as of 08/21/2014 11:11  Ref. Range 08/20/2014 07:57 08/20/2014 11:50 08/20/2014 16:17 08/20/2014 21:16 08/21/2014 07:48  Glucose-Capillary Latest Range: 70-99 mg/dL 440150 (H) 102230 (H) 725233 (H) 147 (H) 203 (H)   Outpatient Diabetes medications: Lantus 24 units daily with supper, Januvia 100 mg daily Current orders for Inpatient glycemic control: Novolog 0-9 units ACHS  Inpatient Diabetes Program Recommendations Insulin - Basal: Please consider ordering low dose Lantus; recommend starting with Lantus 5 units daily (based on 55 kg x 0.1 units).  Thanks, Orlando PennerMarie Ramiah Helfrich, RN, MSN, CCRN Diabetes Coordinator Inpatient Diabetes Program [phone] (Team Pager) [phone] (AP office) [phone] [facility] office)

## 2014-08-21 NOTE — Progress Notes (Signed)
Patient ID: Lorraine Tran  female  ZOX:[id number]    DOB: Jul 10, 1922    DOA: 08/11/2014  PCP: Darrow Bussing, MD  Admit HPI / Brief Narrative:  80 year old female with history of A. fib on Eliquis, type 2 diabetes mellitus with A1c of 6.7, recently started on insulin, hx of stroke, sacral decubitus, dementia, recent hospitalization for right tibial fracture status post repair and discharged to skilled nursing facility was sent to the ED with altered mental status. Patient is confused and nonverbal, unable to provide any history. Patient reportedly was confused, lethargic and poorly responsive in the nursing home. Upon arrival to the ED she had a low-grade temperature of 99.8 Fahrenheit, tachycardic to 128, tachypnea of the 20 with normal blood pressure. O2 sat was maintained.  Blood work done showed WBC of 23.7 with UA was suggestive of UTI. She also was found to have foul-smelling sacral decubitus ulcer. She was initially treated with IV vancomycin and cefepime for sepsis and hospitalist admission requested to step down unit. Blood cultures and urine cultures sent from the ED.  Chest x-ray and head CT unremarkable.  At baseline per daughter she has mild dementia but seems quite oriented. She saw her 1 day prior to admission and was at her baseline. She was participating with PT at SNF . She has been eating well as well.  As per daughter, patient has new bedsores in the back starting only 3 weeks back. Also has new eschars in b/l foot which daughter thinks is from the cast/ dressing.  Urine cultures drawn on 08/11/2014 grew Enterobacter and Escherichia coli, both organisms being susceptible to ciprofloxacin. On 08/15/2014 ceftriaxone was discontinued as she was started on Cipro 400 mg IV twice a day. Given clinical stability she was transferred out of the step down unit to the floor. Speech pathology evaluated patient on 08/15/2014 recommended dysphagia 1 thin liquid diet.  Currently getting  hydrotherapy and Palliative medicine following for GOC.     Assessment/Plan: Principal Problem:  Severe sepsis -Enterobacter cloacae bacteremia, Escherichia coli UTI  - Wound cultures growing multiple organisms, no staph, likely a combination of UTI and infected sacral decubitus  -Ceftriaxone was discontinued on 08/15/2014 started on ciprofloxacin 400 mg IV twice a day, both organisms susceptible to ciprofloxacin, will RX for 2 weeks from 10/23-> till 11/6  -Wound care/Gen Surgery recommended hydrotherapy for sacral ulcer, ?wound vac   Decubitus ulcer of sacral region, stage 3  -Gen Surgery/Wound Care recommended hydrotherapy  -continue hydrotherapy per PT    ischemic gangrenous ulcers bilateral feet -due to DM and pressure wounds, Wound Care following, - Orthopedics was consulted, patient was seen by Dr. Lajoyce Corners on 10/27, recommended comfort care, protective boots - Per Dr. Lajoyce Corners, patient is not a good candidate for surgery and only surgical option for these ulcers is above the knee amputation bilaterally. This was discussed by palliative medicine with the daughter who did not want any surgery to be done.  Urinary tract infection Escherichia coli - Continue ciprofloxacin  Hypertension  -stable now, Po metoprolol   Acute toxic metabolic encephalopathy with history of dementia -Patient has mild underlying dementia which is worsened with sepsis  -Head CT negative   Metabolic acidosis / lactic acidosis  - resolved, continue to monitor    Moderate Aortic stenosis   DM type 2  -Cont sensitive SSI added Lantus 5 units daily   Atrial fibrillation  -Currently in sinus rhythm, on metoprolol at 12.5 mg by mouth twice a day  -  not on Anticoagulation   Iron deficiency Anemia  -Continue iron supplements when able to tolerate oral intake   Acute kidney injury  -Due to dehydration/sepsis, resolved with hydration   Protein calorie malnutrition  -Started pure diet  DVT  Prophylaxis:  Code Status: DO NOT RESUSCITATE  Family Communication:  Disposition: Will await palliative medicine decision regarding comfort care if patient's daughter agrees.  Consultants:  Palliative medicine  Orthopedics  Procedures:  None  Antibiotics: Cefepime 10/20>> stopped 10/22  Vancomycin 10/20>> stopped 10/22  Ceftriaxone 10/22>> 10/232  Ciprofloxacin 10/23>>    Subjective: Patient seen and examined, oriented to self, no acute issues overnight  Objective: Weight change: 0.135 kg (4.8 oz)  Intake/Output Summary (Last 24 hours) at 08/21/14 1140 Last data filed at 08/21/14 1100  Gross per 24 hour  Intake    480 ml  Output   2226 ml  Net  -1746 ml   Blood pressure 147/62, pulse 93, temperature 97.5 F (36.4 C), temperature source Oral, resp. rate 17, height 5\' 6"  (1.676 m), weight 55.02 kg (121 lb 4.8 oz), SpO2 98.00%.  Physical Exam: General: Alert and awake, oriented self, NAD, ill-appearing CVS: S1-S2 clear, no murmur rubs or gallops Chest: clear to auscultation bilaterally Abdomen: soft nontender, nondistended, normal bowel sounds  Extremities: Bilateral lower extremity dressings  Lab Results: Basic Metabolic Panel:  Recent Labs Lab 08/14/14 2113 08/16/14 0438 08/18/14 0615  NA 135* 137 137  K 3.6* 3.0* 4.1  CL 98 100 97  CO2 22 22 27   GLUCOSE 181* 146* 140*  BUN 8 5* 3*  CREATININE 0.25* 0.23* 0.31*  CALCIUM 7.4* 7.4* 7.6*  MG 2.2  --   --    Liver Function Tests:  Recent Labs Lab 08/14/14 2113  AST 17  ALT 13  ALKPHOS 102  BILITOT 0.4  PROT 5.0*  ALBUMIN 1.6*   No results found for this basename: LIPASE, AMYLASE,  in the last 168 hours No results found for this basename: AMMONIA,  in the last 168 hours CBC:  Recent Labs Lab 08/14/14 2113 08/16/14 0438 08/18/14 0615  WBC 14.8* 13.6* 12.5*  NEUTROABS 12.1*  --   --   HGB 8.8* 8.4* 8.8*  HCT 26.9* 25.3* 26.2*  MCV 83.0 83.0 81.1  PLT 638* 579* PLATELET CLUMPS NOTED  ON SMEAR, COUNT APPEARS INCREASED   Cardiac Enzymes: No results found for this basename: CKTOTAL, CKMB, CKMBINDEX, TROPONINI,  in the last 168 hours BNP: No components found with this basename: POCBNP,  CBG:  Recent Labs Lab 08/20/14 0757 08/20/14 1150 08/20/14 1617 08/20/14 2116 08/21/14 0748  GLUCAP 150* 230* 233* 147* 203*     Micro Results: Recent Results (from the past 240 hour(s))  URINE CULTURE     Status: None   Collection Time    08/11/14 10:46 PM      Result Value Ref Range Status   Specimen Description URINE, CATHETERIZED   Final   Special Requests NONE   Final   Culture  Setup Time     Final   Value: 08/12/2014 04:15     Performed at [facility] Count     Final   Value: >=100,000 COLONIES/ML     Performed at [facility]   Culture     Final   Value: ESCHERICHIA COLI     Performed at [facility]   Report Status 08/15/2014 FINAL   Final   Organism ID, Bacteria ESCHERICHIA COLI   Final  CULTURE, BLOOD (ROUTINE X 2)     Status: None   Collection Time    08/11/14 10:55 PM      Result Value Ref Range Status   Specimen Description BLOOD RIGHT ARM   Final   Special Requests BOTTLES DRAWN AEROBIC AND ANAEROBIC 5CC EA   Final   Culture  Setup Time     Final   Value: 08/12/2014 04:21     Performed at [facility]   Culture     Final   Value: ENTEROBACTER CLOACAE     Note: Gram Stain Report Called to,Read Back By and Verified With: AMANDA PETTIFORD ON 08/12/2014 AT 9:52P BY WILEJ     Performed at [facility]   Report Status 08/14/2014 FINAL   Final   Organism ID, Bacteria ENTEROBACTER CLOACAE   Final  CULTURE, BLOOD (ROUTINE X 2)     Status: None   Collection Time    08/11/14 11:05 PM      Result Value Ref Range Status   Specimen Description BLOOD RIGHT HAND   Final   Special Requests BOTTLES DRAWN AEROBIC AND ANAEROBIC 4CC EA   Final   Culture  Setup Time     Final   Value: 08/12/2014 04:21     Performed  at [facility]   Culture     Final   Value: ENTEROBACTER CLOACAE     Note: SUSCEPTIBILITIES PERFORMED ON PREVIOUS CULTURE WITHIN THE LAST 5 DAYS.     Note: Gram Stain Report Called to,Read Back By and Verified With: AMANDA PETTIFORD ON 08/12/2014 AT 9:52P BY WILEJ     Performed at [facility]   Report Status 08/14/2014 FINAL   Final  WOUND CULTURE     Status: None   Collection Time    08/12/14  8:02 AM      Result Value Ref Range Status   Specimen Description WOUND   Final   Special Requests SACRAL   Final   Gram Stain     Final   Value: NO WBC SEEN     NO SQUAMOUS EPITHELIAL CELLS SEEN     MODERATE GRAM POSITIVE COCCI     IN PAIRS IN CLUSTERS FEW GRAM NEGATIVE RODS     Performed at [facility]   Culture     Final   Value: MULTIPLE ORGANISMS PRESENT, NONE PREDOMINANT     Note: NO STAPHYLOCOCCUS AUREUS ISOLATED NO GROUP A STREP (S.PYOGENES) ISOLATED     Performed at [facility]   Report Status 08/14/2014 FINAL   Final  MRSA PCR SCREENING     Status: None   Collection Time    08/12/14  3:35 PM      Result Value Ref Range Status   MRSA by PCR NEGATIVE  NEGATIVE Final   Comment:            The GeneXpert MRSA Assay (FDA     approved for NASAL specimens     only), is one component of a     comprehensive MRSA colonization     surveillance program. It is not     intended to diagnose MRSA     infection nor to guide or     monitor treatment for     MRSA infections.  WOUND CULTURE     Status: None   Collection Time    08/19/14  2:34 PM      Result Value Ref Range Status   Specimen Description  WOUND RIGHT FOOT   Final   Special Requests NONE   Final   Gram Stain     Final   Value: ABUNDANT WBC PRESENT, PREDOMINANTLY PMN     NO SQUAMOUS EPITHELIAL CELLS SEEN     NO ORGANISMS SEEN     [facility]formed at [facility]   Culture     Final   Value: FEW GRAM NEGATIVE RODS     Performed at [facility]   Report Status PENDING   Incomplete     Studies/Results: Ct Head Wo Contrast  08/12/2014   CLINICAL DATA:  Altered mental status. Last seen normal on Friday. Nonverbal.  EXAM: CT HEAD WITHOUT CONTRAST  TECHNIQUE: Contiguous axial images were obtained from the base of the skull through the vertex without intravenous contrast.  COMPARISON:  MRI brain 07/04/2014.  CT head 07/04/2014.  FINDINGS: Diffuse cerebral atrophy. Mild ventricular dilatation consistent with central atrophy. Low-attenuation changes throughout the deep white matter consistent with small vessel ischemic changes. Old areas of encephalomalacia in the right posterior parietal and left occipital regions consistent with old infarcts. Old lacune or infarcts in the periventricular white matter, basal ganglia, and filum eye bilaterally. Probable old lacunar infarcts in the mid brain. No significant changes since previous study. No evidence of acute intracranial hemorrhage. No mass effect or midline shift. No abnormal extra-axial fluid collections. Basal cisterns are not effaced. No depressed skull fractures. Visualized paranasal sinuses and mastoid air cells are not opacified. Vascular calcifications.  IMPRESSION: No acute intracranial abnormalities. Chronic atrophy and small vessel ischemic changes. 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   Electronically Signed   By: Burman NievesWilliam  Stevens M.D.   On: 08/12/2014 01:02   Dg Chest Portable 1 View  08/11/2014   CLINICAL DATA:  80 year old female with altered mental status. atrial fibrillation. Initial encounter.  EXAM: PORTABLE CHEST - 1 VIEW  COMPARISON:  Portable chest radiograph 07/04/2014.  FINDINGS: Portable AP semi upright view at 2314 hrs. Stable lung volumes. Allowing for portable technique, the lungs are clear. Normal cardiac size and mediastinal contours. Visualized tracheal air column is within normal limits.  IMPRESSION: No acute cardiopulmonary abnormality.   Electronically Signed   By: Augusto GambleLee  Hall M.D.   On: 08/11/2014 23:23   Dg Foot 2 Views  Right  08/12/2014   CLINICAL DATA:  80 year old female with bilateral foot wounds and sepsis. Initial encounter.  EXAM: RIGHT FOOT - 2 VIEW  COMPARISON:  Intraoperative radiographs 07/06/2014.  FINDINGS: Distal aspect of tibia intra medullary rod with distal interlocking hardware identified. No evidence of hardware loosening. Stable cortical defect along the post row lateral tibia. Grossly stable mortise joint alignment.  Calcified atherosclerosis in the ankle and foot. Calcaneus appears intact. No acute fracture or osteolysis identified in the right foot. No subcutaneous gas.  IMPRESSION: No acute osseous abnormality identified in the right foot. Stable visualized distal tibia ORIF hardware.   Electronically Signed   By: Augusto GambleLee  Hall M.D.   On: 08/12/2014 02:20   Dg Foot Complete Left  08/12/2014   CLINICAL DATA:  Sepsis.  Bilateral foot wounds.  EXAM: LEFT FOOT - COMPLETE 3+ VIEW  COMPARISON:  Left ankle 07/04/2014  FINDINGS: Diffuse bone demineralization. Degenerative changes in the interphalangeal joints and first metatarsal-phalangeal joint. Comminuted fractures of the calcaneus again demonstrated. Interval placement of a intra medullary rod and screw in the distal tibia. No new fracture or focal bone erosion is suggested. Vascular calcifications.  IMPRESSION: No acute bony abnormalities. Recent fracture of calcaneus  again demonstrated. Diffuse bone demineralization. No focal bone erosion.   Electronically Signed   By: Burman NievesWilliam  Stevens M.D.   On: 08/12/2014 02:19    Medications: Scheduled Meds: . ciprofloxacin  400 mg Intravenous Q12H  . collagenase   Topical Daily  . feeding supplement (GLUCERNA SHAKE)  237 mL Oral BID BM  . feeding supplement (PRO-STAT SUGAR FREE 64)  30 mL Oral TID WC  . heparin subcutaneous  5,000 Units Subcutaneous 3 times per day  . insulin aspart  0-9 Units Subcutaneous TID AC & HS  . insulin glargine  5 Units Subcutaneous QHS  . metoprolol tartrate  12.5 mg Oral BID  .  mirtazapine  7.5 mg Oral QHS  . silver nitrate applicators  10 Stick Topical Once  . silver sulfADIAZINE   Topical Daily  . sodium chloride  3 mL Intravenous Q12H  . vancomycin  750 mg Intravenous Q24H      LOS: 10 days   Jahmani Staup M.D. [facility] 08/21/2014, 11:40 AM Pager: [phone]  If 7PM-7AM, please contact night-coverage [email or web address] Password [email or web address]

## 2014-08-22 LAB — GLUCOSE, CAPILLARY
GLUCOSE-CAPILLARY: 130 mg/dL — AB (ref 70–99)
Glucose-Capillary: 175 mg/dL — ABNORMAL HIGH (ref 70–99)

## 2014-08-22 LAB — WOUND CULTURE

## 2014-08-22 MED ORDER — GLUCERNA SHAKE PO LIQD: 237.0000 mL | Freq: Two times a day (BID) | ORAL | Status: AC

## 2014-08-22 MED ORDER — INSULIN ASPART 100 UNIT/ML ~~LOC~~ SOLN: 0.0000 [IU] | Freq: Three times a day (TID) | SUBCUTANEOUS | Status: AC

## 2014-08-22 MED ORDER — MIRTAZAPINE 15 MG PO TBDP: 7.5000 mg | ORAL_TABLET | Freq: Every day | ORAL | Status: AC

## 2014-08-22 MED ORDER — DOXYCYCLINE HYCLATE 100 MG PO CAPS: 100.0000 mg | ORAL_CAPSULE | Freq: Two times a day (BID) | ORAL | Status: DC

## 2014-08-22 MED ORDER — PRO-STAT SUGAR FREE PO LIQD: 30.0000 mL | Freq: Three times a day (TID) | ORAL | Status: AC

## 2014-08-22 MED ORDER — METOPROLOL TARTRATE 25 MG PO TABS: 12.5000 mg | ORAL_TABLET | Freq: Two times a day (BID) | ORAL | Status: AC

## 2014-08-22 MED ORDER — CIPROFLOXACIN HCL 500 MG PO TABS: 500.0000 mg | ORAL_TABLET | Freq: Two times a day (BID) | ORAL | Status: DC

## 2014-08-22 MED ORDER — INSULIN GLARGINE 100 UNIT/ML ~~LOC~~ SOLN: 5.0000 [IU] | Freq: Every day | SUBCUTANEOUS | Status: AC

## 2014-08-22 MED ORDER — SILVER SULFADIAZINE 1 % EX CREA: TOPICAL_CREAM | Freq: Every day | CUTANEOUS | Status: AC

## 2014-08-22 NOTE — Care Management Note (Signed)
CARE MANAGEMENT NOTE 08/22/2014  Patient:  Lorraine Tran, Lorraine Tran   Account Number:  [id number]  Date Initiated:  08/14/2014  Documentation initiated by:  MAYO,HENRIETTA  Subjective/Objective Assessment:   dx sepsis; resident of [facility] SNF     Action/Plan:   08/22/2014 Per daughter, she has decided to move forward with comfort care for this pt and wishes for the pt to be d/c to [place], for residential hospice services.   Anticipated DC Date:  08/22/2014   Anticipated DC Plan:  [place]  In-house referral  Clinical Social Worker      DC Planning Services  CM consult      Choice offered to / List presented to:             Status of service:  Completed, signed off Medicare Important Message given?  YES (If response is "NO", the following Medicare IM given date fields will be blank) Date Medicare IM given:  08/14/2014 Medicare IM given by:  MAYO,HENRIETTA Date Additional Medicare IM given:  08/18/2014 Additional Medicare IM given by:  [facility]  Discharge Disposition:    Per UR Regulation:  Reviewed for med. necessity/level of care/duration of stay  If discussed at [place] of Stay Meetings, dates discussed:    Comments:  08/22/2014 IM given to daughter.  CRoyal RN MPH, case manager, [phone]  08/22/2014 Met with pt and daughter, plan now per daughter is to d/c to residential hospice facility and the daughter has chosen [facility].  CSW, Crawford Givens notifed and will arrange hospice placement . Jasmine Pang RN MPH, case manager, [phone]    08/19/2014  [place], [place] Plan for discharge to SNF, Palliative care meeting 08/19/2014

## 2014-08-22 NOTE — Clinical Social Work Note (Signed)
CSW informed by nurse case manager, Johny Shockheryl Royal that daughter is requesting residential hospice facility for patient. CSW visited room, acknowledged patient who smile and verbally greeted CSW and talked with daughter, [place] Domnnabell Vanalstyne regarding hospice. [facility]. CSW contacted CSW Forrestine Himva Davis [facility]. They are able to take patient today and CSW has arranged transport via ambulance. Daughter is at the bedside. CSW signing off as patient discharging to hospice facility today.  Genelle BalVanessa Toshiro Hanken, MSW, LCSW [phone]

## 2014-08-22 NOTE — Consult Note (Signed)
[place] Liaison: Received request from [place] for family interest in [facility]. Chart reviewed and met with daughter. [facility] available today. Completed transfer paper work with daughter at bedside. Dr. Orpah Melter to assume care. Await removal of wound vac. Appreciate help from [facility] and CSW. Please fax discharge summary to [phone]. RN please call report to [phone]. Thank you. Erling Conte LCSW [phone]

## 2014-08-22 NOTE — Progress Notes (Signed)
Physical Therapy Discharge Patient Details Name: Lorraine Tran MRN: [id number] DOB: 08-17-1922 Today's Date: 08/22/2014 Time:  -     Patient discharged from PT services secondary to medical decline. Spoke with Yong ChannelAlicia Parker, NP with Palliative Care regarding pt case, and pt is not appropriate for PT services at this time.   Please see latest therapy progress note for current level of functioning and progress toward goals.    Progress and discharge plan discussed with patient and/or caregiver: Patient unable to participate in discharge planning and no caregivers available.  If needs change, please reconsult.      Conni SlipperKirkman, Ralonda Tartt 08/22/2014, 10:36 AM   Conni SlipperLaura Kinsly Hild, PT, DPT Acute Rehabilitation Services Pager: [phone]

## 2014-08-22 NOTE — Discharge Summary (Signed)
Physician Discharge Summary  Patient ID: Lorraine Tran MRN: [id number] DOB/AGE: Apr 15, 1922 80 y.o.  Admit date: 08/11/2014 Discharge date: 08/22/2014  Primary Care Physician:  Darrow BussingKOIRALA,DIBAS, MD  Discharge Diagnoses:    . Severe sepsis . Dehydration . Dementia . Metabolic acidosis . DM hyperosmolarity type II, uncontrolled . Acute encephalopathy . Decubitus ulcer of sacral region, stage 3 . HTN (hypertension) . Hyperglycemia . Anemia . Hyperkalemia . Acute kidney injury . UTI (urinary tract infection) . Eschar of lower leg . Atrial fibrillation . Lactic acidosis  Consults: Palliative medicine   Recommendations for Outpatient Follow-up:  Patient is comfort care, DNR  Wet to dry dressing BID  Allergies:   Allergies  Allergen Reactions  . Penicillins Other (See Comments)    Unknown reaction     Discharge Medications:   Medication List    STOP taking these medications       ELIQUIS 2.5 MG Tabs tablet  Generic drug:  apixaban     escitalopram 5 MG tablet  Commonly known as:  LEXAPRO     ferrous sulfate 325 (65 FE) MG tablet     HYDROcodone-acetaminophen 5-325 MG per tablet  Commonly known as:  NORCO/VICODIN     RA SALINE ENEMA RE     sitaGLIPtin 100 MG tablet  Commonly known as:  JANUVIA      TAKE these medications       acetaminophen 325 MG tablet  Commonly known as:  TYLENOL  Take 650 mg by mouth every 4 (four) hours as needed for mild pain.     bisacodyl 10 MG suppository  Commonly known as:  DULCOLAX  Place 10 mg rectally daily as needed for mild constipation or moderate constipation.     feeding supplement (GLUCERNA SHAKE) Liqd  Take 237 mLs by mouth 2 (two) times daily between meals.     feeding supplement (PRO-STAT SUGAR FREE 64) Liqd  Take 30 mLs by mouth 3 (three) times daily with meals.     insulin aspart 100 UNIT/ML injection  Commonly known as:  novoLOG  Inject 0-9 Units into the skin 4 (four) times daily -  before meals  and at bedtime. Sliding scale CBG 70 - 120: 0 units CBG 121 - 150: 1 unit,  CBG 151 - 200: 2 units,  CBG 201 - 250: 3 units,  CBG 251 - 300: 5 units,  CBG 301 - 350: 7 units,  CBG 351 - 400: 9 units   CBG > 400: 9 units and notify your MD     insulin glargine 100 UNIT/ML injection  Commonly known as:  LANTUS  Inject 0.05 mLs (5 Units total) into the skin at bedtime.     metoprolol tartrate 25 MG tablet  Commonly known as:  LOPRESSOR  Take 0.5 tablets (12.5 mg total) by mouth 2 (two) times daily.     MILK OF MAGNESIA PO  Take 30 mLs by mouth daily as needed (no BM in 3 days).     mirtazapine 15 MG disintegrating tablet  Commonly known as:  REMERON SOL-TAB  Take 0.5 tablets (7.5 mg total) by mouth at bedtime.     silver sulfADIAZINE 1 % cream  Commonly known as:  SILVADENE  Apply topically daily.         Brief H and P: For complete details please refer to admission H and P, but in brief[age] year old female with history of A. fib on Eliquis, type 2 diabetes mellitus with A1c of 6.7, recently started on insulin,  hx of stroke, sacral decubitus, dementia, recent hospitalization for right tibial fracture status post repair and discharged to skilled nursing facility was sent to the ED with altered mental status noted today. patient is confused and nonverbal, unable to provide any history. Patient reportedly was confused, lethargic and poorly responsive in the nursing home. Upon arrival to the ED she had a low-grade temperature of 99.8 Fahrenheit, tachycardic to 128, tachypnea of the 20 with normal blood pressure. O2 sat was maintained.  Blood work done showed WBC of 23.7 K., hemoglobin of 9.7, platelets of 786.  History short sodium 138, potassium 5.6, chloride 94, CO2 16 with anion gap of 28. BUN of 47 and glucose of 318. UA was suggestive of UTI Along with positive ketones. She also was found to have foul-smelling sacral decubitus ulcer.  Patient was given monitor IV normal saline bolus, 1 dose  of IV vancomycin and cefepime for sepsis and hospitalist admission requested to step down unit. Blood cultures and urine cultures sent from the ED.  Marland Kitchen. Chest x-ray and head CT unremarkable.  No history of fever, chills, nausea, vomiting, bowel or urinary symptoms noted.  At baseline per daughter she has mild dementia but seems quite oriented. She saw her 1 day prior to admission and was at her baseline. She was participating with PT at SNF . She has been eating well as well.  As per daughter , patient has new bedsores in the back starting only 3 weeks back. Also has new eschars in b/l foot which daughter thinks is from the cast/ dressing.   Hospital Course:   80 year old female with history of A. fib on Eliquis, type 2 diabetes mellitus with A1c of 6.7, recently started on insulin, hx of stroke, sacral decubitus, dementia, recent hospitalization for right tibial fracture status post repair and discharged to skilled nursing facility was sent to the ED with altered mental status. Urine cultures drawn on 08/11/2014 grew Enterobacter and Escherichia coli, both organisms being susceptible to ciprofloxacin. On 08/15/2014 ceftriaxone was discontinued as she was started on Cipro 400 mg IV twice a day. Given clinical stability she was transferred out of the step down unit to the floor. Speech pathology evaluated patient on 08/15/2014 recommended dysphagia 1 thin liquid diet.   Severe sepsis -Enterobacter cloacae bacteremia, Escherichia coli UTI   Wound cultures grew multiple organisms, no staph, likely a combination of UTI and infected sacral decubitus  Ceftriaxone was discontinued on 08/15/2014 started on ciprofloxacin 400 mg IV twice a day, both organisms susceptible to ciprofloxacin, Wound care/Gen Surgery recommended hydrotherapy for sacral ulcer andwound vac. Wound vac and antibiotics were discontinued due to comfort care goals.   Decubitus ulcer of sacral region, stage 3  Gen Surgery/Wound Care  recommended hydrotherapy.   ischemic gangrenous ulcers bilateral feet  due to DM and pressure wounds, Wound Care was consulted. Orthopedics was consulted, patient was seen by Dr. Lajoyce Cornersuda on 10/27, recommended comfort care, protective boots   Per Dr. Lajoyce Cornersuda, patient is not a good candidate for surgery and only surgical option for these ulcers is above the knee amputation bilaterally. This was discussed by palliative medicine with the daughter who did not want any surgery to be done. Palliative medicine was consulted, per GOC with daughter, she requested no surgeries and comfort care goals.    Urinary tract infection Escherichia coli : Patient was placed on ciprofloxacin.    Hypertension  -stable now, Po metoprolol   Acute toxic metabolic encephalopathy with history of dementia  -Patient  has underlying dementia which is worsened with sepsis  -Head CT negative   Metabolic acidosis / lactic acidosis  - resolved  Moderate Aortic stenosis   DM type 2  -Cont sensitive SSI added Lantus 5 units daily   Atrial fibrillation  -Currently in sinus rhythm, on metoprolol at 12.5 mg by mouth twice a day  - not on Anticoagulation   Acute kidney injury  -Due to dehydration/sepsis, resolved with hydration   Protein calorie malnutrition  -Started pure diet   Day of Discharge BP 118/55  Pulse 89  Temp(Src) 98.3 F (36.8 C) (Oral)  Resp 16  Ht 5\' 6"  (1.676 m)  Wt 55.75 kg (122 lb 14.5 oz)  BMI 19.85 kg/m2  SpO2 100%  Physical Exam: General: Alert and awake,  not in any acute distress. CVS: S1-S2 clear no murmur rubs or gallops Chest: clear to auscultation bilaterally, no wheezing rales or rhonchi Abdomen: soft nontender, nondistended, normal bowel sounds Extremities: b/l lower extremity dressings intact   The results of significant diagnostics from this hospitalization (including imaging, microbiology, ancillary and laboratory) are listed below for reference.    LAB RESULTS: Basic  Metabolic Panel:  Recent Labs Lab 08/16/14 0438 08/18/14 0615  NA 137 137  K 3.0* 4.1  CL 100 97  CO2 22 27  GLUCOSE 146* 140*  BUN 5* 3*  CREATININE 0.23* 0.31*  CALCIUM 7.4* 7.6*   Liver Function Tests: No results found for this basename: AST, ALT, ALKPHOS, BILITOT, PROT, ALBUMIN,  in the last 168 hours No results found for this basename: LIPASE, AMYLASE,  in the last 168 hours No results found for this basename: AMMONIA,  in the last 168 hours CBC:  Recent Labs Lab 08/16/14 0438 08/18/14 0615  WBC 13.6* 12.5*  HGB 8.4* 8.8*  HCT 25.3* 26.2*  MCV 83.0 81.1  PLT 579* PLATELET CLUMPS NOTED ON SMEAR, COUNT APPEARS INCREASED   Cardiac Enzymes: No results found for this basename: CKTOTAL, CKMB, CKMBINDEX, TROPONINI,  in the last 168 hours BNP: No components found with this basename: POCBNP,  CBG:  Recent Labs Lab 08/22/14 0754 08/22/14 1148  GLUCAP 130* 175*    Significant Diagnostic Studies:  Ct Head Wo Contrast  08/12/2014   CLINICAL DATA:  Altered mental status. Last seen normal on Friday. Nonverbal.  EXAM: CT HEAD WITHOUT CONTRAST  TECHNIQUE: Contiguous axial images were obtained from the base of the skull through the vertex without intravenous contrast.  COMPARISON:  MRI brain 07/04/2014.  CT head 07/04/2014.  FINDINGS: Diffuse cerebral atrophy. Mild ventricular dilatation consistent with central atrophy. Low-attenuation changes throughout the deep white matter consistent with small vessel ischemic changes. Old areas of encephalomalacia in the right posterior parietal and left occipital regions consistent with old infarcts. Old lacune or infarcts in the periventricular white matter, basal ganglia, and filum eye bilaterally. Probable old lacunar infarcts in the mid brain. No significant changes since previous study. No evidence of acute intracranial hemorrhage. No mass effect or midline shift. No abnormal extra-axial fluid collections. Basal cisterns are not effaced.  No depressed skull fractures. Visualized paranasal sinuses and mastoid air cells are not opacified. Vascular calcifications.  IMPRESSION: No acute intracranial abnormalities. Chronic atrophy and small vessel ischemic changes. 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   Electronically Signed   By: Burman Nieves M.D.   On: 08/12/2014 01:02   Dg Chest Portable 1 View  08/11/2014   CLINICAL DATA:  80 year old female with altered mental status. atrial fibrillation. Initial encounter.  EXAM: PORTABLE CHEST -  1 VIEW  COMPARISON:  Portable chest radiograph 07/04/2014.  FINDINGS: Portable AP semi upright view at 2314 hrs. Stable lung volumes. Allowing for portable technique, the lungs are clear. Normal cardiac size and mediastinal contours. Visualized tracheal air column is within normal limits.  IMPRESSION: No acute cardiopulmonary abnormality.   Electronically Signed   By: Augusto Gamble M.D.   On: 08/11/2014 23:23   Dg Foot 2 Views Right  08/12/2014   CLINICAL DATA:  80 year old female with bilateral foot wounds and sepsis. Initial encounter.  EXAM: RIGHT FOOT - 2 VIEW  COMPARISON:  Intraoperative radiographs 07/06/2014.  FINDINGS: Distal aspect of tibia intra medullary rod with distal interlocking hardware identified. No evidence of hardware loosening. Stable cortical defect along the post row lateral tibia. Grossly stable mortise joint alignment.  Calcified atherosclerosis in the ankle and foot. Calcaneus appears intact. No acute fracture or osteolysis identified in the right foot. No subcutaneous gas.  IMPRESSION: No acute osseous abnormality identified in the right foot. Stable visualized distal tibia ORIF hardware.   Electronically Signed   By: Augusto Gamble M.D.   On: 08/12/2014 02:20   Dg Foot Complete Left  08/12/2014   CLINICAL DATA:  Sepsis.  Bilateral foot wounds.  EXAM: LEFT FOOT - COMPLETE 3+ VIEW  COMPARISON:  Left ankle 07/04/2014  FINDINGS: Diffuse bone demineralization. Degenerative changes in the interphalangeal  joints and first metatarsal-phalangeal joint. Comminuted fractures of the calcaneus again demonstrated. Interval placement of a intra medullary rod and screw in the distal tibia. No new fracture or focal bone erosion is suggested. Vascular calcifications.  IMPRESSION: No acute bony abnormalities. Recent fracture of calcaneus again demonstrated. Diffuse bone demineralization. No focal bone erosion.   Electronically Signed   By: Burman Nieves M.D.   On: 08/12/2014 02:19       Disposition and Follow-up:    DISPOSITION:[facility]  DIET: comfort foods, puree diet   DISCHARGE FOLLOW-UP Follow-up Information   Follow up with Darrow Bussing, MD. Schedule an appointment as soon as possible for a visit in 2 weeks. (for hospital follow-up, As needed, If symptoms worsen)    Specialty:  Family Medicine   Contact information:   [address] [place] [address] [phone]       Time spent on Discharge: 35 mins  Signed:   RAI,RIPUDEEP M.D. [facility] 08/22/2014, 2:34 PM Pager: [phone]

## 2014-08-22 NOTE — Progress Notes (Signed)
I met with Lorraine Tran at the bedside. She tells me that she has made peace with the decision for hospice and full comfort care and feels good about this decision. With her guidance wound vac will be d/c, no antibiotics, no CBGs. Lorraine Tran appears comfortable. Lorraine Tran says that she has spoken with Harmon Pier from hospice and that she feels very good about this move and decision. Lorraine Tran other daughter will be coming from [place] on Sunday/Monday. I believe Lorraine Tran is in a good place at this point.   Vinie Sill, NP Palliative Medicine Team Pager # [phone] (M-F 8a-5p) Team Phone # [phone] (Nights/Weekends)

## 2014-08-22 NOTE — Consult Note (Signed)
WOC wound follow up Wound type: Stage IV Pressure ulcer; sacrum Measurement:10.5cm x 10cm x 3cm with undermining  Wound bed: 75% red, 25% yellow  Drainage (amount, consistency, odor) moderate, serosanguinous  Periwound: intact  Dressing procedure/placement/frequency: NPWT VAC dressing placed and bridged to the right hip.  1pc of black foam placed in the wound bed and 1 strip used to bridge, protected intact skin under bridge.  Pt tolerated, IV premedication provided per bedside nurse prior to dressing change.  WOC will follow along with you for NPWT VAC dressing and support.  Xcaret Morad [place] RN,CWOCN [phone]

## 2014-09-23 DEATH — deceased

## 2015-07-26 IMAGING — CT CT HEAD W/O CM
1 of 2 series · 14 of 30 positions shown, 18 images · non-contrast
Comparison: MRI brain 07/04/2014.  CT head 07/04/2014.

CLINICAL DATA: Altered mental status. Last seen normal on [REDACTED].
Nonverbal.

EXAM:
CT HEAD WITHOUT CONTRAST
TECHNIQUE: Contiguous axial images were obtained from the base of the skull
through the vertex without intravenous contrast.

[Series 2: head 5.0 h30s · axial · 0.41mm/px · z∈[-148,-18]mm · 14 of 31 slices shown, 18 images]
[im 3/31  brain]
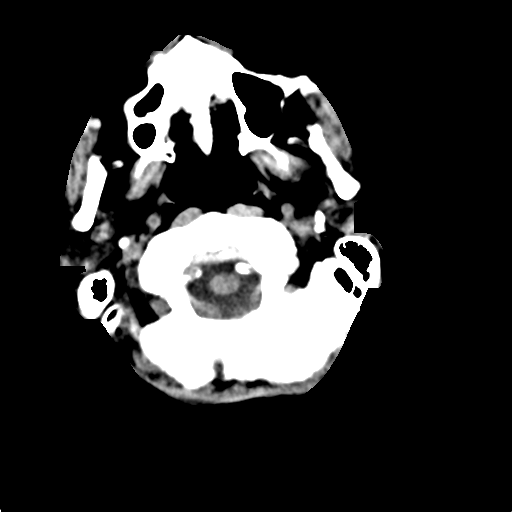
[im 3/31  bone]
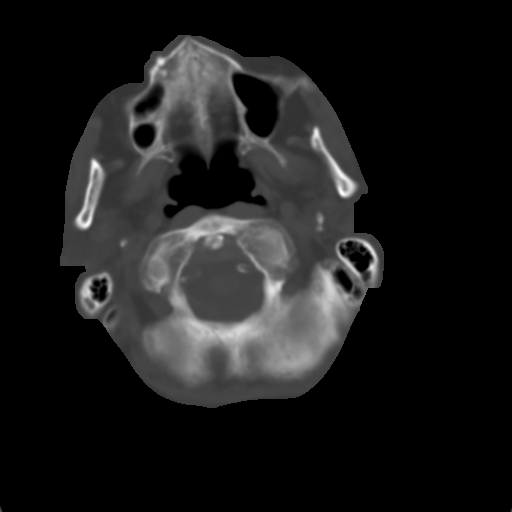
[im 5/31  brain]
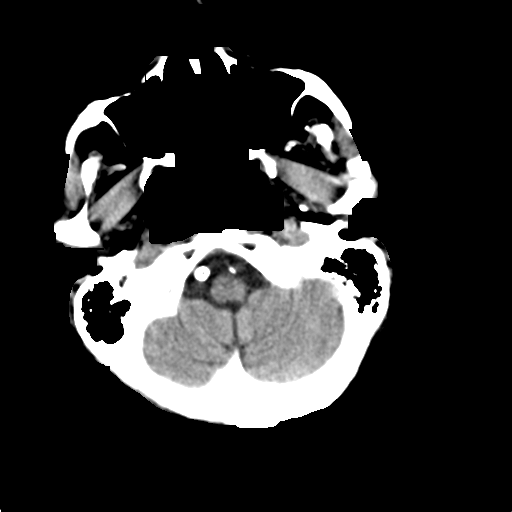
[im 7/31  brain]
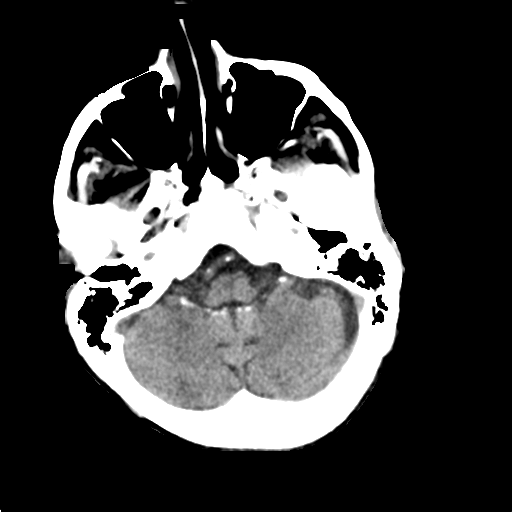
[im 9/31  brain]
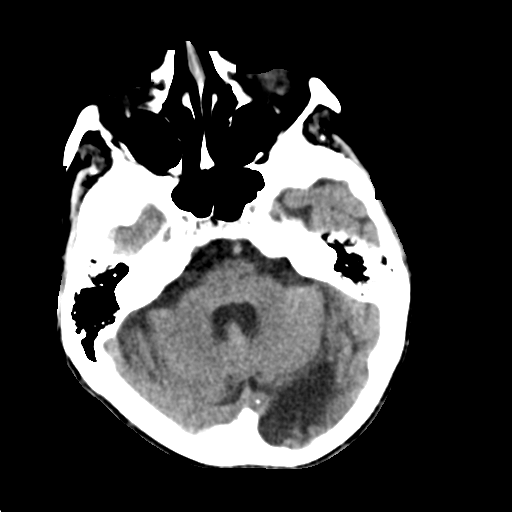
[im 11/31  brain]
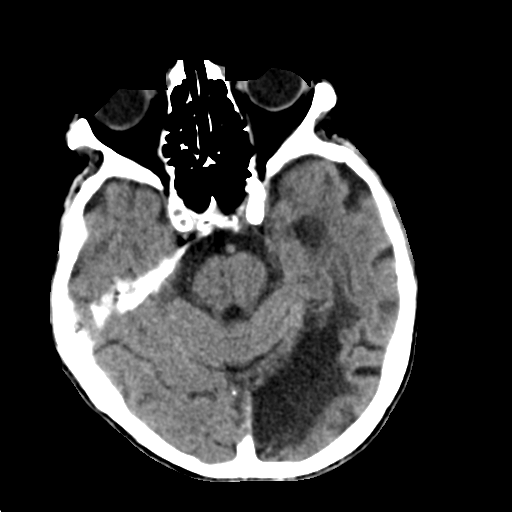
[im 11/31  bone]
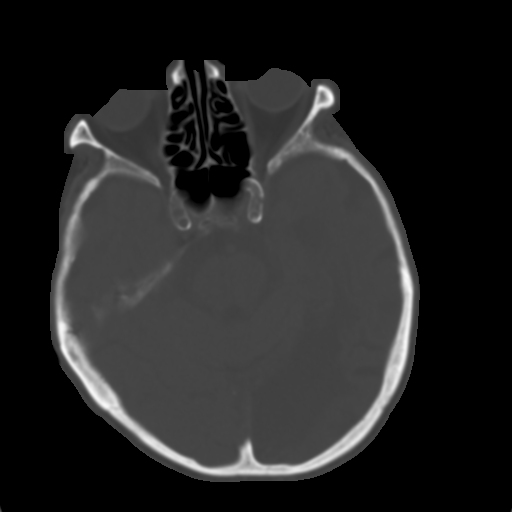
[im 13/31  brain]
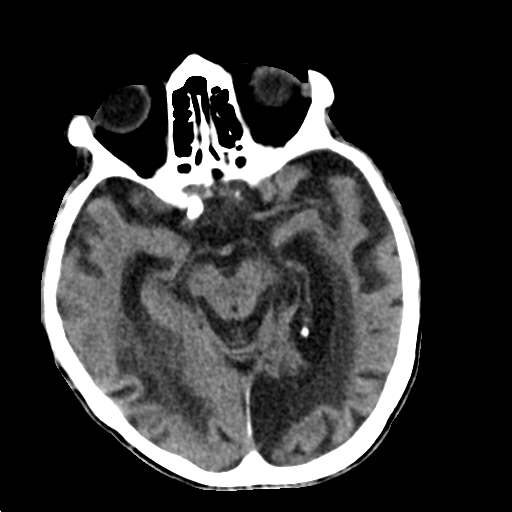
[im 15/31  brain]
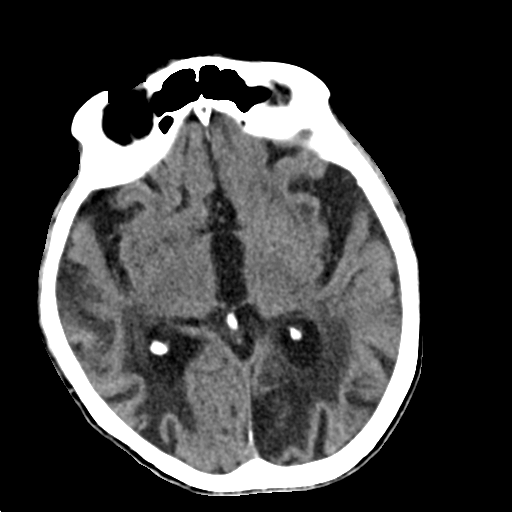
[im 17/31  brain]
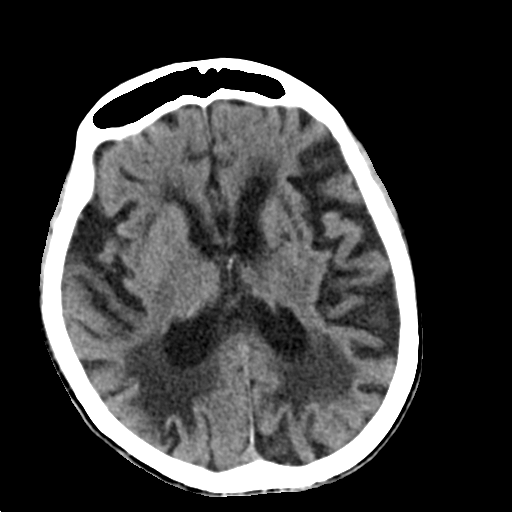
[im 19/31  brain]
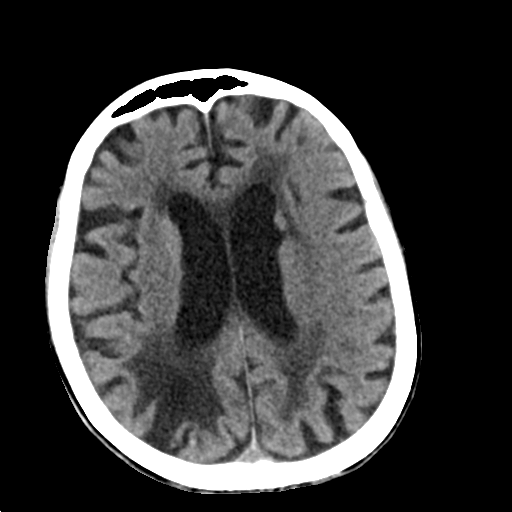
[im 19/31  bone]
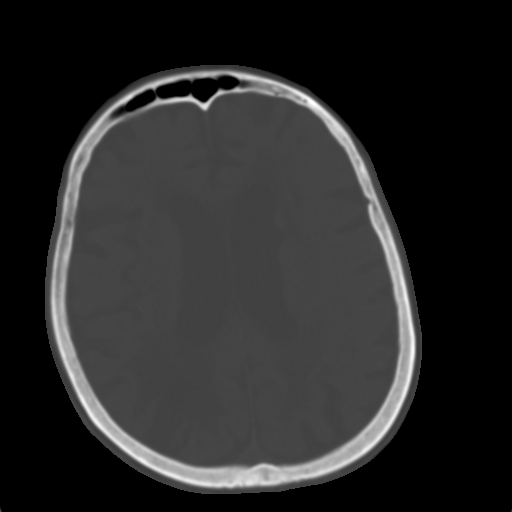
[im 21/31  brain]
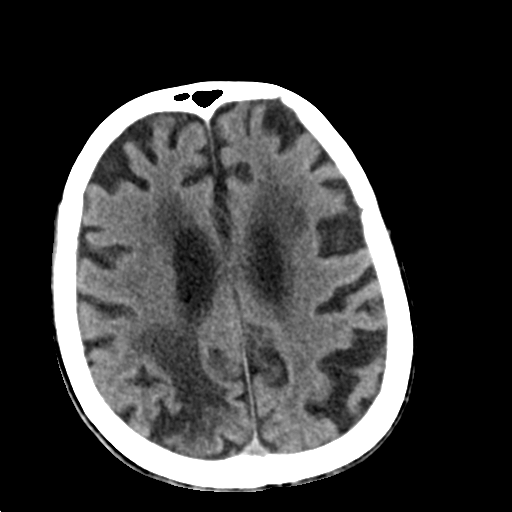
[im 23/31  brain]
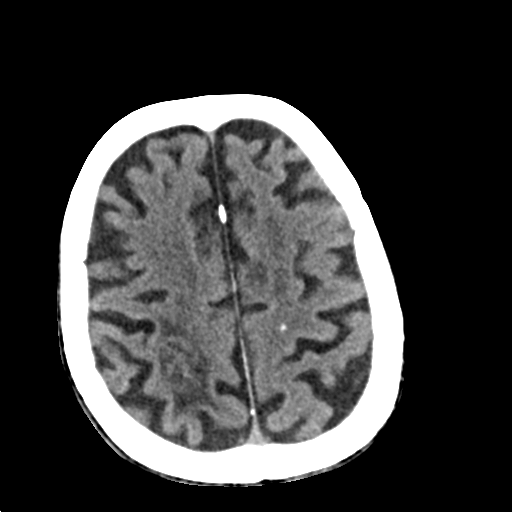
[im 25/31  brain]
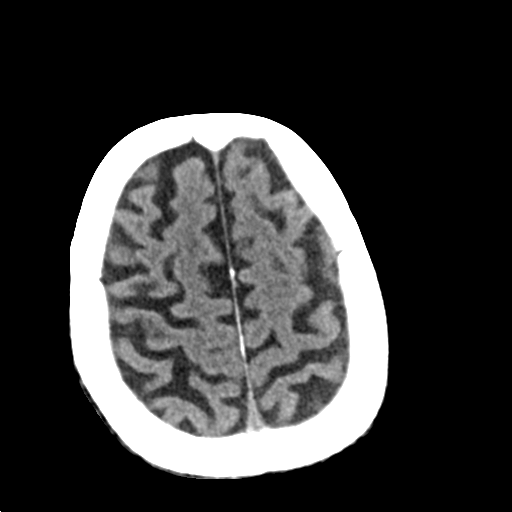
[im 27/31  brain]
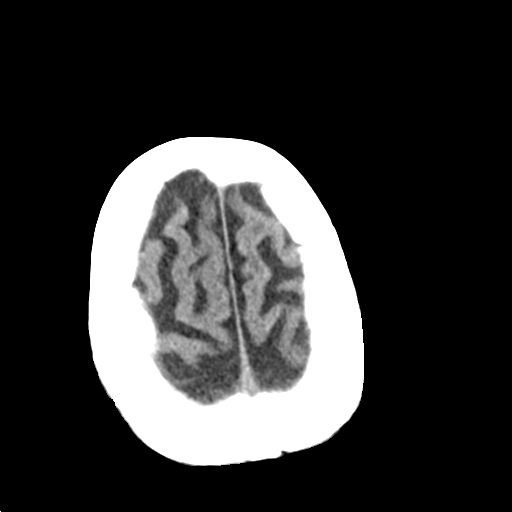
[im 27/31  bone]
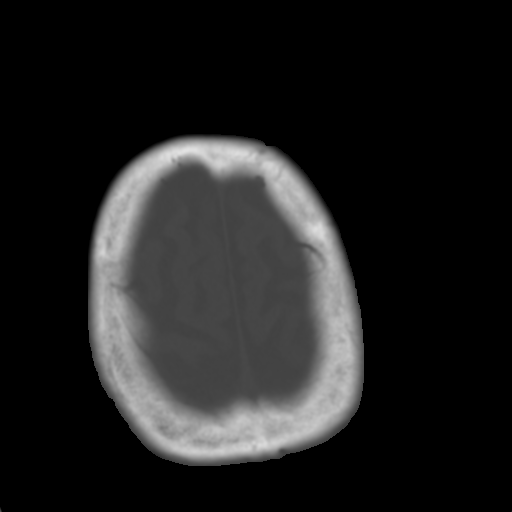
[im 29/31  brain]
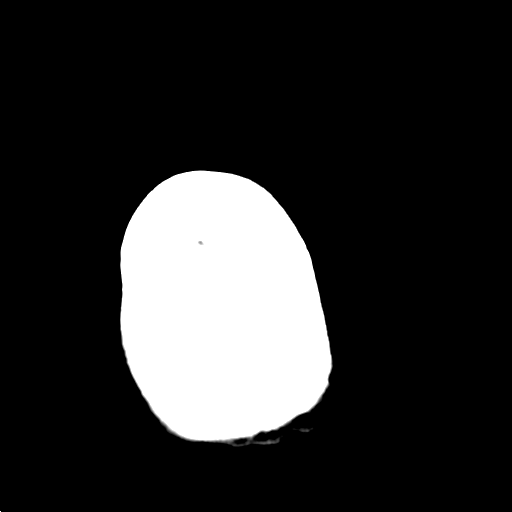

[14 of 30 positions shown; findings below may reference images not displayed]

FINDINGS: Diffuse cerebral atrophy. Mild ventricular dilatation consistent
with central atrophy. Low-attenuation changes throughout the deep
white matter consistent with small vessel ischemic changes. Old
areas of encephalomalacia in the right posterior parietal and left
occipital regions consistent with old infarcts. Old lacune or
infarcts in the periventricular white matter, basal ganglia, and
filum eye bilaterally. Probable old lacunar infarcts in the mid
brain. No significant changes since previous study. No evidence of
acute intracranial hemorrhage. No mass effect or midline shift. No
abnormal extra-axial fluid collections. Basal cisterns are not
effaced. No depressed skull fractures. Visualized paranasal sinuses
and mastoid air cells are not opacified. Vascular calcifications.
IMPRESSION: No acute intracranial abnormalities. Chronic atrophy and small
vessel ischemic changes. Old bilateral infarcts.

## 2023-08-23 ENCOUNTER — Ambulatory Visit (HOSPITAL_BASED_OUTPATIENT_CLINIC_OR_DEPARTMENT_OTHER): Payer: Medicare Other | Admitting: Orthopaedic Surgery
